# Patient Record
Sex: Female | Born: 1995 | Race: Black or African American | Hispanic: No | Marital: Single | Smoking: Never smoker
Health system: Southern US, Community
[De-identification: ages and names within clinical notes are randomized; demographics above are authoritative.]

## PROBLEM LIST (undated history)

## (undated) DIAGNOSIS — R109 Unspecified abdominal pain: Secondary | ICD-10-CM

## (undated) DIAGNOSIS — F419 Anxiety disorder, unspecified: Secondary | ICD-10-CM

## (undated) DIAGNOSIS — E559 Vitamin D deficiency, unspecified: Secondary | ICD-10-CM

## (undated) HISTORY — DX: Anxiety disorder, unspecified: F41.9

## (undated) HISTORY — PX: OTHER SURGICAL HISTORY: SHX169

## (undated) HISTORY — DX: Unspecified abdominal pain: R10.9

---
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
## 2013-06-17 ENCOUNTER — Encounter: Payer: Self-pay | Admitting: Neurology

## 2013-06-21 ENCOUNTER — Ambulatory Visit: Payer: BC Managed Care – PPO | Admitting: Neurology

## 2013-09-16 ENCOUNTER — Ambulatory Visit: Payer: BC Managed Care – PPO | Admitting: Neurology

## 2014-02-10 ENCOUNTER — Encounter: Payer: Self-pay | Admitting: Internal Medicine

## 2014-02-10 ENCOUNTER — Ambulatory Visit: Payer: Medicaid Other | Attending: Internal Medicine | Admitting: Internal Medicine

## 2014-02-10 VITALS — BP 151/106 | HR 87 | Temp 98.1°F | Resp 16 | Ht 66.0 in | Wt 135.0 lb

## 2014-02-10 DIAGNOSIS — R102 Pelvic and perineal pain: Secondary | ICD-10-CM | POA: Diagnosis present

## 2014-02-10 DIAGNOSIS — R030 Elevated blood-pressure reading, without diagnosis of hypertension: Secondary | ICD-10-CM | POA: Diagnosis not present

## 2014-02-10 DIAGNOSIS — IMO0001 Reserved for inherently not codable concepts without codable children: Secondary | ICD-10-CM

## 2014-02-10 DIAGNOSIS — Z2821 Immunization not carried out because of patient refusal: Secondary | ICD-10-CM | POA: Diagnosis not present

## 2014-02-10 DIAGNOSIS — R1084 Generalized abdominal pain: Secondary | ICD-10-CM

## 2014-02-10 LAB — POCT URINALYSIS DIPSTICK
Bilirubin, UA: NEGATIVE
Blood, UA: NEGATIVE
Glucose, UA: NEGATIVE
Ketones, UA: NEGATIVE
Leukocytes, UA: NEGATIVE
Nitrite, UA: NEGATIVE
Protein, UA: NEGATIVE
Spec Grav, UA: 1.025
Urobilinogen, UA: 1
pH, UA: 7

## 2014-02-10 LAB — POCT URINE PREGNANCY: Preg Test, Ur: NEGATIVE

## 2014-02-10 NOTE — Progress Notes (Signed)
Pt is here to establish care. Pt reports having abdomen pain for a month and a half. Pt states that she has no discharge, burning or vaginal itching/odor.

## 2014-02-10 NOTE — Progress Notes (Signed)
Patient ID: Regina RosebushBriana Duerr, female   DOB: 22-Nov-1995, 19 y.o.   MRN: [id number]  [id number]  [id number]  DOB - 22-Nov-1995  CC:  Chief Complaint  Patient presents with  . Establish Care       HPI: Regina Warren is a 19 y.o. female here today to establish medical care.  She has no past medical history.   Abdominal pain for 1 month--no vaginal discharge, itch, odor, lesions.  Lower pelvic pain, cramps. Pain is not related to periods. Intermittent. Has tried tylenol for pain. Occasional nausea, no vomiting. Denies dyspareunia.  Tested for STD's in September and everything was negative.   Been on OCP since 04/2013.  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  She does not increased stress with school schedule and work load. Patient denies family history of hypertension.  Patient has No headache, No chest pain, No abdominal pain - No Nausea, No new weakness tingling or numbness, No Cough - SOB.  No Known Allergies History reviewed. No pertinent past medical history. No current outpatient prescriptions on file prior to visit.   No current facility-administered medications on file prior to visit.   History reviewed. No pertinent family history. History   Social History  . Marital Status: Single    Spouse Name: N/A    Number of Children: N/A  . Years of Education: N/A   Occupational History  . Not on file.   Social History Main Topics  . Smoking status: Never Smoker   . Smokeless tobacco: Not on file  . Alcohol Use: No  . Drug Use: No  . Sexual Activity: Yes   Other Topics Concern  . Not on file   Social History Narrative  . No narrative on file    Review of Systems: Se HPI    Objective:   Filed Vitals:   02/10/14 1636  BP: 151/106  Pulse: 87  Temp: 98.1 F (36.7 C)  Resp: 16    Physical Exam: Constitutional: Patient appears well-developed and well-nourished. No distress. Eyes: Conjunctivae and EOM  are normal. PERRLA, no scleral icterus. Neck: Normal ROM. Neck supple. No JVD. No tracheal deviation. No thyromegaly. CVS: RRR, S1/S2 +, no murmurs, no gallops, no carotid bruit.  Pulmonary: Effort and breath sounds normal, no stridor, rhonchi, wheezes, rales.  Abdominal: Soft. BS +, no distension, tenderness, rebound or guarding.  Musculoskeletal: Normal range of motion. No edema and no tenderness.  Skin: Skin is warm and dry. No rash noted. Not diaphoretic. No erythema. No pallor. Psychiatric: Normal mood and affect. Behavior, judgment, thought content normal.  No results found for: WBC, HGB, HCT, MCV, PLT No results found for: CREATININE, BUN, NA, K, CL, CO2  No results found for: HGBA1C Lipid Panel  No results found for: CHOL, TRIG, HDL, CHOLHDL, VLDL, LDLCALC     Assessment and plan:   [name] was seen today for establish care.  Diagnoses and associated orders for this visit:  Generalized abdominal pain - POCT urinalysis dipstick--negative - POCT urine pregnancy-negative  Elevated BP Will discontinue current combination birth control pill and have her to come back in one week to see if there is any improvement in BP. Patient chooses to wait until BP recheck to decide if she wants to start on Micronor-progestin only pill. Discussed the need to use protection when having sex to prevent pregnancy and STD transmission. Patient verbalizes understanding.  Refused influenza vaccine Explained benefits of vaccine especially with  her being in college campus community to prevent spread and transmission of virus   Return in about 2 weeks (around 02/24/2014) for Nurse Visit-BP check.     Holland Commons, NP-C [facility] [phone] 02/10/2014, 5:24 PM

## 2014-02-10 NOTE — Patient Instructions (Signed)
DASH Eating Plan DASH stands for "Dietary Approaches to Stop Hypertension." The DASH eating plan is a healthy eating plan that has been shown to reduce high blood pressure (hypertension). Additional health benefits may include reducing the risk of type 2 diabetes mellitus, heart disease, and stroke. The DASH eating plan may also help with weight loss. WHAT DO I NEED TO KNOW ABOUT THE DASH EATING PLAN? For the DASH eating plan, you will follow these general guidelines:  Choose foods with a percent daily value for sodium of less than 5% (as listed on the food label).  Use salt-free seasonings or herbs instead of table salt or sea salt.  Check with your health care provider or pharmacist before using salt substitutes.  Eat lower-sodium products, often labeled as "lower sodium" or "no salt added."  Eat fresh foods.  Eat more vegetables, fruits, and low-fat dairy products.  Choose whole grains. Look for the word "whole" as the first word in the ingredient list.  Choose fish and skinless chicken or turkey more often than red meat. Limit fish, poultry, and meat to 6 oz (170 g) each day.  Limit sweets, desserts, sugars, and sugary drinks.  Choose heart-healthy fats.  Limit cheese to 1 oz (28 g) per day.  Eat more home-cooked food and less restaurant, buffet, and fast food.  Limit fried foods.  Cook foods using methods other than frying.  Limit canned vegetables. If you do use them, rinse them well to decrease the sodium.  When eating at a restaurant, ask that your food be prepared with less salt, or no salt if possible. WHAT FOODS CAN I EAT? Seek help from a dietitian for individual calorie needs. Grains Whole grain or whole wheat bread. Brown rice. Whole grain or whole wheat pasta. Quinoa, bulgur, and whole grain cereals. Low-sodium cereals. Corn or whole wheat flour tortillas. Whole grain cornbread. Whole grain crackers. Low-sodium crackers. Vegetables Fresh or frozen vegetables  (raw, steamed, roasted, or grilled). Low-sodium or reduced-sodium tomato and vegetable juices. Low-sodium or reduced-sodium tomato sauce and paste. Low-sodium or reduced-sodium canned vegetables.  Fruits All fresh, canned (in natural juice), or frozen fruits. Meat and Other Protein Products Ground beef (85% or leaner), grass-fed beef, or beef trimmed of fat. Skinless chicken or turkey. Ground chicken or turkey. Pork trimmed of fat. All fish and seafood. Eggs. Dried beans, peas, or lentils. Unsalted nuts and seeds. Unsalted canned beans. Dairy Low-fat dairy products, such as skim or 1% milk, 2% or reduced-fat cheeses, low-fat ricotta or cottage cheese, or plain low-fat yogurt. Low-sodium or reduced-sodium cheeses. Fats and Oils Tub margarines without trans fats. Light or reduced-fat mayonnaise and salad dressings (reduced sodium). Avocado. Safflower, olive, or canola oils. Natural peanut or almond butter. Other Unsalted popcorn and pretzels. The items listed above may not be a complete list of recommended foods or beverages. Contact your dietitian for more options. WHAT FOODS ARE NOT RECOMMENDED? Grains White bread. White pasta. White rice. Refined cornbread. Bagels and croissants. Crackers that contain trans fat. Vegetables Creamed or fried vegetables. Vegetables in a cheese sauce. Regular canned vegetables. Regular canned tomato sauce and paste. Regular tomato and vegetable juices. Fruits Dried fruits. Canned fruit in light or heavy syrup. Fruit juice. Meat and Other Protein Products Fatty cuts of meat. Ribs, chicken wings, bacon, sausage, bologna, salami, chitterlings, fatback, hot dogs, bratwurst, and packaged luncheon meats. Salted nuts and seeds. Canned beans with salt. Dairy Whole or 2% milk, cream, half-and-half, and cream cheese. Whole-fat or sweetened yogurt. Full-fat   cheeses or blue cheese. Nondairy creamers and whipped toppings. Processed cheese, cheese spreads, or cheese  curds. Condiments Onion and garlic salt, seasoned salt, table salt, and sea salt. Canned and packaged gravies. Worcestershire sauce. Tartar sauce. Barbecue sauce. Teriyaki sauce. Soy sauce, including reduced sodium. Steak sauce. Fish sauce. Oyster sauce. Cocktail sauce. Horseradish. Ketchup and mustard. Meat flavorings and tenderizers. Bouillon cubes. Hot sauce. Tabasco sauce. Marinades. Taco seasonings. Relishes. Fats and Oils Butter, stick margarine, lard, shortening, ghee, and bacon fat. Coconut, palm kernel, or palm oils. Regular salad dressings. Other Pickles and olives. Salted popcorn and pretzels. The items listed above may not be a complete list of foods and beverages to avoid. Contact your dietitian for more information. WHERE CAN I FIND MORE INFORMATION? National Heart, Lung, and Blood Institute: [employer] Document Released: 01/02/2011 Document Revised: 05/30/2013 Document Reviewed: 11/17/2012 [facility] Patient Information 2015 [place], [place]. This information is not intended to replace advice given to you by your health care provider. Make sure you discuss any questions you have with your health care provider.  Norethindrone tablets (contraception) What is this medicine? NORETHINDRONE (nor eth IN drone) is an oral contraceptive. The product contains a female hormone known as a progestin. It is used to prevent pregnancy. This medicine may be used for other purposes; ask your health care provider or pharmacist if you have questions. COMMON BRAND NAME(S): Camila, Deblitane 28-Day, Errin, Heather, [place], [place], [place], Nor-QD, Nora-BE, Norlyroc, Ortho Micronor, [employer] 28-Day What should I tell my health care provider before I take this medicine? They need to know if you have any of these conditions: -blood vessel disease or blood clots -breast, cervical, or vaginal cancer -diabetes -heart disease -kidney disease -liver disease -mental  depression -migraine -seizures -stroke -vaginal bleeding -an unusual or allergic reaction to norethindrone, other medicines, foods, dyes, or preservatives -pregnant or trying to get pregnant -breast-feeding How should I use this medicine? Take this medicine by mouth with a glass of water. You may take it with or without food. Follow the directions on the prescription label. Take this medicine at the same time each day and in the order directed on the package. Do not take your medicine more often than directed. Contact your pediatrician regarding the use of this medicine in children. Special care may be needed. This medicine has been used in female children who have started having menstrual periods. A patient package insert for the product will be given with each prescription and refill. Read this sheet carefully each time. The sheet may change frequently. Overdosage: If you think you have taken too much of this medicine contact a poison control center or emergency room at once. NOTE: This medicine is only for you. Do not share this medicine with others. What if I miss a dose? Try not to miss a dose. Every time you miss a dose or take a dose late your chance of pregnancy increases. When 1 pill is missed (even if only 3 hours late), take the missed pill as soon as possible and continue taking a pill each day at the regular time (use a back up method of birth control for the next 48 hours). If more than 1 dose is missed, use an additional birth control method for the rest of your pill pack until menses occurs. Contact your health care professional if more than 1 dose has been missed. What may interact with this medicine? Do not take this medicine with any of the following medications: -amprenavir or fosamprenavir -bosentan This medicine may  also interact with the following medications: -antibiotics or medicines for infections, especially rifampin, rifabutin, rifapentine, and griseofulvin, and  possibly penicillins or tetracyclines -aprepitant -barbiturate medicines, such as phenobarbital -carbamazepine -felbamate -modafinil -oxcarbazepine -phenytoin -ritonavir or other medicines for HIV infection or AIDS -St. John's wort -topiramate This list may not describe all possible interactions. Give your health care provider a list of all the medicines, herbs, non-prescription drugs, or dietary supplements you use. Also tell them if you smoke, drink alcohol, or use illegal drugs. Some items may interact with your medicine. What should I watch for while using this medicine? Visit your doctor or health care professional for regular checks on your progress. You will need a regular breast and pelvic exam and Pap smear while on this medicine. Use an additional method of birth control during the first cycle that you take these tablets. If you have any reason to think you are pregnant, stop taking this medicine right away and contact your doctor or health care professional. If you are taking this medicine for hormone related problems, it may take several cycles of use to see improvement in your condition. This medicine does not protect you against HIV infection (AIDS) or any other sexually transmitted diseases. What side effects may I notice from receiving this medicine? Side effects that you should report to your doctor or health care professional as soon as possible: -breast tenderness or discharge -pain in the abdomen, chest, groin or leg -severe headache -skin rash, itching, or hives -sudden shortness of breath -unusually weak or tired -vision or speech problems -yellowing of skin or eyes Side effects that usually do not require medical attention (report to your doctor or health care professional if they continue or are bothersome): -changes in sexual desire -change in menstrual flow -facial hair growth -fluid retention and swelling -headache -irritability -nausea -weight gain or  loss This list may not describe all possible side effects. Call your doctor for medical advice about side effects. You may report side effects to FDA at 1-800-FDA-1088. Where should I keep my medicine? Keep out of the reach of children. Store at room temperature between 15 and 30 degrees C (59 and 86 degrees F). Throw away any unused medicine after the expiration date. NOTE: This sheet is a summary. It may not cover all possible information. If you have questions about this medicine, talk to your doctor, pharmacist, or health care provider.  2015, Elsevier/Gold Standard. (2011-10-03 16:41:35)

## 2014-02-16 ENCOUNTER — Telehealth: Payer: Self-pay

## 2014-02-16 ENCOUNTER — Ambulatory Visit: Payer: Medicaid Other | Attending: Internal Medicine | Admitting: Pharmacist

## 2014-02-16 VITALS — BP 134/92 | HR 75 | Temp 98.0°F | Resp 16

## 2014-02-16 DIAGNOSIS — IMO0001 Reserved for inherently not codable concepts without codable children: Secondary | ICD-10-CM

## 2014-02-16 DIAGNOSIS — R030 Elevated blood-pressure reading, without diagnosis of hypertension: Principal | ICD-10-CM

## 2014-02-16 NOTE — Telephone Encounter (Signed)
Per Regina Warren patient put on schedule to discuss Birth control and to get blood work Blood pressure has been elevated past couple of visits

## 2014-02-16 NOTE — Progress Notes (Signed)
   Subjective:    Patient ID: Regina Warren, female    DOB: [birth year], 19 y.o.   MRN: [id number]  HPI    Review of Systems     Objective:   Physical Exam        Assessment & Plan:

## 2014-02-16 NOTE — Progress Notes (Signed)
Patient states here to have her blood pressure checked Patient has since stopped taking her birth control pills due To the fact she says that is what might have caused her blood pressure  To be elevated Patient did state she is under a lot of stress right now with classes Patient stated when she checked her blood pressure a few days ago it was 139/94 i had patient sit for about twenty minutes to destress Her blood pressure is now 134/ 92 Spoke with Holland CommonsValerie Keck NP We will bring patient back in one week before starting new birth control medication-to check her blood pressure If pressure is still elevated we will send her for US renal to r/o renal artery stenosis

## 2014-02-20 ENCOUNTER — Ambulatory Visit: Payer: Medicaid Other | Admitting: Internal Medicine

## 2014-03-01 ENCOUNTER — Ambulatory Visit: Payer: Medicaid Other | Attending: Internal Medicine | Admitting: Internal Medicine

## 2014-03-01 ENCOUNTER — Encounter: Payer: Self-pay | Admitting: Internal Medicine

## 2014-03-01 ENCOUNTER — Ambulatory Visit: Payer: Medicaid Other | Admitting: Internal Medicine

## 2014-03-01 VITALS — BP 130/70 | HR 107 | Temp 98.0°F | Resp 16 | Ht 66.0 in | Wt 136.0 lb

## 2014-03-01 DIAGNOSIS — IMO0001 Reserved for inherently not codable concepts without codable children: Secondary | ICD-10-CM

## 2014-03-01 DIAGNOSIS — Z30011 Encounter for initial prescription of contraceptive pills: Secondary | ICD-10-CM

## 2014-03-01 DIAGNOSIS — Z309 Encounter for contraceptive management, unspecified: Secondary | ICD-10-CM

## 2014-03-01 DIAGNOSIS — R030 Elevated blood-pressure reading, without diagnosis of hypertension: Secondary | ICD-10-CM

## 2014-03-01 LAB — POCT URINE PREGNANCY: PREG TEST UR: NEGATIVE

## 2014-03-01 MED ORDER — NORETHINDRONE 0.35 MG PO TABS: 1.0000 | ORAL_TABLET | Freq: Every day | ORAL | Status: DC

## 2014-03-01 NOTE — Progress Notes (Signed)
Pt is here to have lab work today.

## 2014-03-01 NOTE — Patient Instructions (Signed)
Please have school nurse check BP every other day and write it down for review. Call me with numbers

## 2014-03-02 LAB — COMPLETE METABOLIC PANEL WITH GFR
ALT: 16 U/L (ref 0–35)
AST: 17 U/L (ref 0–37)
Albumin: 4.3 g/dL (ref 3.5–5.2)
Alkaline Phosphatase: 47 U/L (ref 39–117)
BUN: 11 mg/dL (ref 6–23)
CHLORIDE: 103 meq/L (ref 96–112)
CO2: 24 mEq/L (ref 19–32)
Calcium: 9.2 mg/dL (ref 8.4–10.5)
Creat: 0.69 mg/dL (ref 0.50–1.10)
GFR, Est African American: >89 mL/min
GFR, Est Non African American: >89 mL/min
GLUCOSE: 91 mg/dL (ref 70–99)
Potassium: 4 mEq/L (ref 3.5–5.3)
Sodium: 136 mEq/L (ref 135–145)
Total Bilirubin: 0.4 mg/dL (ref 0.2–1.2)
Total Protein: 7.3 g/dL (ref 6.0–8.3)

## 2014-03-02 LAB — TSH: TSH: 2.256 u[IU]/mL (ref 0.350–4.500)

## 2014-03-02 LAB — CBC
HCT: 40.6 % (ref 36.0–46.0)
Hemoglobin: 14.1 g/dL (ref 12.0–15.0)
MCH: 32 pg (ref 26.0–34.0)
MCHC: 34.7 g/dL (ref 30.0–36.0)
MCV: 92.3 fL (ref 78.0–100.0)
MPV: 10.4 fL (ref 8.6–12.4)
PLATELETS: 291 10*3/uL (ref 150–400)
RBC: 4.4 MIL/uL (ref 3.87–5.11)
RDW: 12.4 % (ref 11.5–15.5)
WBC: 4.4 10*3/uL (ref 4.0–10.5)

## 2014-03-02 LAB — VITAMIN D 25 HYDROXY (VIT D DEFICIENCY, FRACTURES): Vit D, 25-Hydroxy: 6 ng/mL — ABNORMAL LOW (ref 30–100)

## 2014-03-02 LAB — T4, FREE: Free T4: 1.09 ng/dL (ref 0.80–1.80)

## 2014-03-03 ENCOUNTER — Telehealth: Payer: Self-pay | Admitting: *Deleted

## 2014-03-03 ENCOUNTER — Ambulatory Visit: Payer: Medicaid Other | Admitting: Internal Medicine

## 2014-03-03 MED ORDER — VITAMIN D (ERGOCALCIFEROL) 1.25 MG (50000 UNIT) PO CAPS: 50000.0000 [IU] | ORAL_CAPSULE | ORAL | Status: DC

## 2014-03-03 NOTE — Telephone Encounter (Signed)
-----   Message from Ambrose FinlandValerie A Keck, NP sent at 03/02/2014  9:01 PM EST ----- Vitamin D is low. Please send drisdol 50,000 IU to take once weekly for 12 weeks. 12 tablets no refills. Explain that this is important for bone development and may help with feelings of fatigue. All other labs are normal

## 2014-03-03 NOTE — Telephone Encounter (Signed)
-----   Message from Valerie A Keck, NP sent at 03/02/2014  9:01 PM EST ----- Vitamin D is low. Please send drisdol 50,000 IU to take once weekly for 12 weeks. 12 tablets no refills. Explain that this is important for bone development and may help with feelings of fatigue. All other labs are normal  

## 2014-03-03 NOTE — Telephone Encounter (Signed)
LVM to return call [facility]

## 2014-03-03 NOTE — Telephone Encounter (Signed)
[facility] Pt aware of resuts

## 2014-03-14 DIAGNOSIS — R030 Elevated blood-pressure reading, without diagnosis of hypertension: Secondary | ICD-10-CM | POA: Insufficient documentation

## 2014-03-14 NOTE — Progress Notes (Signed)
Patient ID: Regina RosebushBriana Finkbiner, female   DOB: 09/23/1995, 19 y.o.   MRN: [id number]  CC: birth control, elevated BP  HPI: Regina Warren is a 19 y.o. female here today for a follow up visit.  Patient presents to clinic today for a repeat blood pressure check.  She states that she has not been able to get her BP checked since our last visit but believes that it is elevated due to being nervous and stressed. Patient has no family history of hypertension.  Patient would like to monitor her blood pressure outside of clinic to see if it comes down.  She is also interested beginning on a non estrogen containing birth control pill.  She has no symptoms of headache, palpitations, chest pain, SOB, or edema.   Patient has No headache, No chest pain, No abdominal pain - No Nausea, No new weakness tingling or numbness, No Cough - SOB.  No Known Allergies History reviewed. No pertinent past medical history. Current Outpatient Prescriptions on File Prior to Visit  Medication Sig Dispense Refill  . norethindrone-ethinyl estradiol (MICROGESTIN,JUNEL,LOESTRIN) 1-20 MG-MCG tablet Take 1 tablet by mouth daily.     No current facility-administered medications on file prior to visit.   History reviewed. No pertinent family history. History   Social History  . Marital Status: Single    Spouse Name: N/A  . Number of Children: N/A  . Years of Education: N/A   Occupational History  . Not on file.   Social History Main Topics  . Smoking status: Never Smoker   . Smokeless tobacco: Not on file  . Alcohol Use: No  . Drug Use: No  . Sexual Activity: Yes   Other Topics Concern  . Not on file   Social History Narrative    Review of Systems: See HPI   Objective:   Filed Vitals:   03/01/14 1724  BP: 130/70  Pulse:   Temp:   Resp:     Physical Exam  Constitutional: She is oriented to person, place, and time.  Cardiovascular: Normal rate, regular rhythm and normal heart sounds.   Pulmonary/Chest:  Effort normal and breath sounds normal.  Abdominal: Soft. Bowel sounds are normal.  Musculoskeletal: She exhibits no edema.  Neurological: She is alert and oriented to person, place, and time.  Skin: Skin is warm and dry.     Lab Results  Component Value Date   WBC 4.4 03/01/2014   HGB 14.1 03/01/2014   HCT 40.6 03/01/2014   MCV 92.3 03/01/2014   PLT 291 03/01/2014   Lab Results  Component Value Date   CREATININE 0.69 03/01/2014   BUN 11 03/01/2014   NA 136 03/01/2014   K 4.0 03/01/2014   CL 103 03/01/2014   CO2 24 03/01/2014    No results found for: HGBA1C Lipid Panel  No results found for: CHOL, TRIG, HDL, CHOLHDL, VLDL, LDLCALC     Assessment and plan:   [name] was seen today for follow-up.  Diagnoses and all orders for this visit:  Elevated BP Orders: -     CBC -     COMPLETE METABOLIC PANEL WITH GFR -     TSH -     T4, free -     Vitamin D, 25-hydroxy Patient will let school nurse check her BP every couple of days and call back to clinic to report the values.   BCP (birth control pills) initiation Orders: -     POCT urine pregnancy -  Begin norethindrone (MICRONOR,CAMILA,ERRIN) 0.35 MG tablet; Take 1 tablet (0.35 mg total) by mouth daily. Explained to patient the importance of using condoms for STD/HIV prevention and for the first few weeks to prevent pregnancy. Explained that this medication should be taken at the same time daily to prevent pregnancy.      Return for call me with BP numbers next week.   Holland Commons, NP-C [facility] [phone] 03/14/2014, 3:00 PM

## 2014-03-15 ENCOUNTER — Telehealth: Payer: Self-pay | Admitting: *Deleted

## 2014-03-15 NOTE — Telephone Encounter (Signed)
Please call patient and find out if she was able to get her BP checked. Find out what the number was if possible. Thanks    Pt called and stated was unable to get BP check will do it today and tomorrow. Will call back with information

## 2014-05-22 ENCOUNTER — Ambulatory Visit: Payer: Medicaid Other | Admitting: Internal Medicine

## 2014-05-25 ENCOUNTER — Encounter: Payer: Self-pay | Admitting: Internal Medicine

## 2014-05-25 ENCOUNTER — Ambulatory Visit: Payer: Medicaid Other | Attending: Internal Medicine | Admitting: Internal Medicine

## 2014-05-25 VITALS — BP 130/89 | HR 79 | Temp 98.5°F | Resp 16 | Ht 66.0 in | Wt 134.0 lb

## 2014-05-25 DIAGNOSIS — Z3041 Encounter for surveillance of contraceptive pills: Secondary | ICD-10-CM | POA: Diagnosis not present

## 2014-05-25 MED ORDER — NORGESTIMATE-ETH ESTRADIOL 0.25-35 MG-MCG PO TABS: 1.0000 | ORAL_TABLET | Freq: Every day | ORAL | Status: DC

## 2014-05-25 NOTE — Progress Notes (Signed)
Pt is here today wanting change her birth control.

## 2014-05-25 NOTE — Patient Instructions (Signed)
Ethinyl Estradiol; Norgestimate tablets What is this medicine? ETHINYL ESTRADIOL; NORGESTIMATE (ETH in il es tra DYE ole; nor JES ti mate) is an oral contraceptive. The products combine two types of female hormones, an estrogen and a progestin. They are used to prevent ovulation and pregnancy. Some products are also used to treat acne in females. This medicine may be used for other purposes; ask your health care provider or pharmacist if you have questions. COMMON BRAND NAME(S): Estarylla, MONO-LINYAH, MonoNessa, Ortho Tri-Cyclen, Ortho Tri-Cyclen Lo, Ortho-Cyclen, Previfem, Sprintec, Tri-Estarylla, TRI-LINYAH, Tri-Lo-Sprintec, Tri-Previfem, Tri-Sprintec, Trinessa What should I tell my health care provider before I take this medicine? They need to know if you have or ever had any of these conditions: -abnormal vaginal bleeding -blood vessel disease or blood clots -breast, cervical, endometrial, ovarian, liver, or uterine cancer -diabetes -gallbladder disease -heart disease or recent heart attack -high blood pressure -high cholesterol -kidney disease -liver disease -migraine headaches -stroke -systemic lupus erythematosus (SLE) -tobacco smoker -an unusual or allergic reaction to estrogens, progestins, other medicines, foods, dyes, or preservatives -pregnant or trying to get pregnant -breast-feeding How should I use this medicine? Take this medicine by mouth. To reduce nausea, this medicine may be taken with food. Follow the directions on the prescription label. Take this medicine at the same time each day and in the order directed on the package. Do not take your medicine more often than directed. Contact your pediatrician regarding the use of this medicine in children. Special care may be needed. This medicine has been used in female children who have started having menstrual periods. A patient package insert for the product will be given with each prescription and refill. Read this sheet  carefully each time. The sheet may change frequently. Overdosage: If you think you have taken too much of this medicine contact a poison control center or emergency room at once. NOTE: This medicine is only for you. Do not share this medicine with others. What if I miss a dose? If you miss a dose, refer to the patient information sheet you received with your medicine for direction. If you miss more than one pill, this medicine may not be as effective and you may need to use another form of birth control. What may interact with this medicine? -acetaminophen -antibiotics or medicines for infections, especially rifampin, rifabutin, rifapentine, and griseofulvin, and possibly penicillins or tetracyclines -aprepitant -ascorbic acid (vitamin C) -atorvastatin -barbiturate medicines, such as phenobarbital -bosentan -carbamazepine -caffeine -clofibrate -cyclosporine -dantrolene -doxercalciferol -felbamate -grapefruit juice -hydrocortisone -medicines for anxiety or sleeping problems, such as diazepam or temazepam -medicines for diabetes, including pioglitazone -mineral oil -modafinil -mycophenolate -nefazodone -oxcarbazepine -phenytoin -prednisolone -ritonavir or other medicines for HIV infection or AIDS -rosuvastatin -selegiline -soy isoflavones supplements -St. John's wort -tamoxifen or raloxifene -theophylline -thyroid hormones -topiramate -warfarin This list may not describe all possible interactions. Give your health care provider a list of all the medicines, herbs, non-prescription drugs, or dietary supplements you use. Also tell them if you smoke, drink alcohol, or use illegal drugs. Some items may interact with your medicine. What should I watch for while using this medicine? Visit your doctor or health care professional for regular checks on your progress. You will need a regular breast and pelvic exam and Pap smear while on this medicine. You should also discuss the need  for regular mammograms with your health care professional, and follow his or her guidelines for these tests. This medicine can make your body retain fluid, making your fingers, hands, or ankles   swell. Your blood pressure can go up. Contact your doctor or health care professional if you feel you are retaining fluid. Use an additional method of contraception during the first cycle that you take these tablets. If you have any reason to think you are pregnant, stop taking this medicine right away and contact your doctor or health care professional. If you are taking this medicine for hormone related problems, it may take several cycles of use to see improvement in your condition. Smoking increases the risk of getting a blood clot or having a stroke while you are taking birth control pills, especially if you are more than 21 years old. You are strongly advised not to smoke. This medicine can make you more sensitive to the sun. Keep out of the sun. If you cannot avoid being in the sun, wear protective clothing and use sunscreen. Do not use sun lamps or tanning beds/booths. If you wear contact lenses and notice visual changes, or if the lenses begin to feel uncomfortable, consult your eye care specialist. In some women, tenderness, swelling, or minor bleeding of the gums may occur. Notify your dentist if this happens. Brushing and flossing your teeth regularly may help limit this. See your dentist regularly and inform your dentist of the medicines you are taking. If you are going to have elective surgery, you may need to stop taking this medicine before the surgery. Consult your health care professional for advice. This medicine does not protect you against HIV infection (AIDS) or any other sexually transmitted diseases. What side effects may I notice from receiving this medicine? Side effects that you should report to your doctor or health care professional as soon as possible: -breast tissue changes or  discharge -changes in vaginal bleeding during your period or between your periods -chest pain -coughing up blood -dizziness or fainting spells -headaches or migraines -leg, arm or groin pain -severe or sudden headaches -stomach pain (severe) -sudden shortness of breath -sudden loss of coordination, especially on one side of the body -speech problems -symptoms of vaginal infection like itching, irritation or unusual discharge -tenderness in the upper abdomen -vomiting -weakness or numbness in the arms or legs, especially on one side of the body -yellowing of the eyes or skin Side effects that usually do not require medical attention (report to your doctor or health care professional if they continue or are bothersome): -breakthrough bleeding and spotting that continues beyond the 3 initial cycles of pills -breast tenderness -mood changes, anxiety, depression, frustration, anger, or emotional outbursts -increased sensitivity to sun or ultraviolet light -nausea -skin rash, acne, or brown spots on the skin -weight gain (slight) This list may not describe all possible side effects. Call your doctor for medical advice about side effects. You may report side effects to FDA at 1-800-FDA-1088. Where should I keep my medicine? Keep out of the reach of children. Store at room temperature between 15 and 30 degrees C (59 and 86 degrees F). Throw away any unused medicine after the expiration date. NOTE: This sheet is a summary. It may not cover all possible information. If you have questions about this medicine, talk to your doctor, pharmacist, or health care provider.  2015, Elsevier/Gold Standard. (2007-12-30 13:40:47)  

## 2014-05-25 NOTE — Progress Notes (Signed)
Patient ID: Regina Warren, female   DOB: 1995/04/08, 19 y.o.   MRN: [id number]  CC: birth control changes  HPI: Regina Warren is a 19 y.o. female here today for a follow up visit.  Patient reports that she would like to have her birth control changes because she has been having breakthrough bleeding. She reports that she does not like the micronor pill and would like to have better control of her cycles. Patient states that she has been checking her blood pressure at school and it has continued to be less than 120.   Patient has No headache, No chest pain, No abdominal pain - No Nausea, No new weakness tingling or numbness, No Cough - SOB.  No Known Allergies History reviewed. No pertinent past medical history. Current Outpatient Prescriptions on File Prior to Visit  Medication Sig Dispense Refill  . norethindrone (MICRONOR,CAMILA,ERRIN) 0.35 MG tablet Take 1 tablet (0.35 mg total) by mouth daily. 1 Package 11  . Vitamin D, Ergocalciferol, (DRISDOL) 50000 UNITS CAPS capsule Take 1 capsule (50,000 Units total) by mouth every 7 (seven) days. (Patient not taking: Reported on 05/25/2014) 12 capsule 0   No current facility-administered medications on file prior to visit.   History reviewed. No pertinent family history. History   Social History  . Marital Status: Single    Spouse Name: N/A  . Number of Children: N/A  . Years of Education: N/A   Occupational History  . Not on file.   Social History Main Topics  . Smoking status: Never Smoker   . Smokeless tobacco: Not on file  . Alcohol Use: No  . Drug Use: No  . Sexual Activity: Yes   Other Topics Concern  . Not on file   Social History Narrative    Review of Systems: See HPI   Objective:   Filed Vitals:   05/25/14 1429  BP: 130/89  Pulse: 79  Temp: 98.5 F (36.9 C)  Resp: 16    Physical Exam  Cardiovascular: Normal rate, regular rhythm and normal heart sounds.   Pulmonary/Chest: Effort normal and breath sounds  normal.  ] .  Lab Results  Component Value Date   WBC 4.4 03/01/2014   HGB 14.1 03/01/2014   HCT 40.6 03/01/2014   MCV 92.3 03/01/2014   PLT 291 03/01/2014   Lab Results  Component Value Date   CREATININE 0.69 03/01/2014   BUN 11 03/01/2014   NA 136 03/01/2014   K 4.0 03/01/2014   CL 103 03/01/2014   CO2 24 03/01/2014    No results found for: HGBA1C Lipid Panel  No results found for: CHOL, TRIG, HDL, CHOLHDL, VLDL, LDLCALC     Assessment and plan:   Regina Warren was seen today for follow-up.  Diagnoses and all orders for this visit:  Visit for birth control pills maintenance Orders: -     norgestimate-ethinyl estradiol (SPRINTEC 28) 0.25-35 MG-MCG tablet; Take 1 tablet by mouth daily. Discontinue Micronor and switch to Sprintec.     Return if symptoms worsen or fail to improve.   Holland CommonsKECK, Angles Trevizo, NP-C [facility] [phone] 05/25/2014, 3:05 PM

## 2014-06-06 ENCOUNTER — Telehealth: Payer: Self-pay | Admitting: Internal Medicine

## 2014-06-06 ENCOUNTER — Other Ambulatory Visit: Payer: Self-pay | Admitting: Internal Medicine

## 2014-06-06 ENCOUNTER — Emergency Department (HOSPITAL_COMMUNITY)
Admission: EM | Admit: 2014-06-06 | Discharge: 2014-06-06 | Disposition: A | Discharge status: Home / Self Care | Payer: Medicaid Other | Attending: Emergency Medicine | Admitting: Emergency Medicine

## 2014-06-06 ENCOUNTER — Encounter (HOSPITAL_COMMUNITY): Payer: Self-pay | Admitting: Emergency Medicine

## 2014-06-06 DIAGNOSIS — Z793 Long term (current) use of hormonal contraceptives: Secondary | ICD-10-CM | POA: Insufficient documentation

## 2014-06-06 DIAGNOSIS — R109 Unspecified abdominal pain: Secondary | ICD-10-CM

## 2014-06-06 DIAGNOSIS — Z3200 Encounter for pregnancy test, result unknown: Secondary | ICD-10-CM | POA: Diagnosis present

## 2014-06-06 DIAGNOSIS — Z3202 Encounter for pregnancy test, result negative: Secondary | ICD-10-CM | POA: Diagnosis not present

## 2014-06-06 LAB — URINALYSIS, ROUTINE W REFLEX MICROSCOPIC
Bilirubin Urine: NEGATIVE
Glucose, UA: NEGATIVE mg/dL
Ketones, ur: NEGATIVE mg/dL
Leukocytes, UA: NEGATIVE
Nitrite: NEGATIVE
Protein, ur: NEGATIVE mg/dL
Specific Gravity, Urine: 1.023 (ref 1.005–1.030)
Urobilinogen, UA: 1 mg/dL (ref 0.0–1.0)
pH: 6 (ref 5.0–8.0)

## 2014-06-06 LAB — URINE MICROSCOPIC-ADD ON

## 2014-06-06 LAB — POC URINE PREG, ED: Preg Test, Ur: NEGATIVE

## 2014-06-06 NOTE — Telephone Encounter (Signed)
Pt has question regarding pregarding test/results and needs follow up information, please f/u with pt. Pt prefers to speak to female clinician.

## 2014-06-06 NOTE — Discharge Instructions (Signed)

## 2014-06-06 NOTE — ED Notes (Signed)
Patient coming from home with possible pregnancy.  States last period was on 05/23/14.  States "I took a pregnancy test with the 1st being positive, 2nd was invalid and the other two were invalid".  Patient has bloating and lower abdominal pain.

## 2014-06-06 NOTE — ED Provider Notes (Signed)
CSN: [id number]     Arrival date & time 06/06/14  [id number] History   First MD Initiated Contact with Patient 06/06/14 [phone]     Chief Complaint  Patient presents with  . Possible Pregnancy     (Consider location/radiation/quality/duration/timing/severity/associated sxs/prior Treatment) Patient is a 19 y.o. female presenting with pregnancy problem. The history is provided by the patient. No language interpreter was used.  Possible Pregnancy The current episode started yesterday. The problem occurs constantly. The problem has been unchanged. Pertinent negatives include no abdominal pain. Nothing aggravates the symptoms. She has tried nothing for the symptoms. The treatment provided moderate relief.  Pt had a positive home pregnancy and a negative home pregnancy.     History reviewed. No pertinent past medical history. Past Surgical History  Procedure Laterality Date  . Lung    . Lung puncture     History reviewed. No pertinent family history. History  Substance Use Topics  . Smoking status: Never Smoker   . Smokeless tobacco: Not on file  . Alcohol Use: No   OB History    No data available     Review of Systems  Gastrointestinal: Negative for abdominal pain.  All other systems reviewed and are negative.     Allergies  Review of patient's allergies indicates no known allergies.  Home Medications   Prior to Admission medications   Medication Sig Start Date End Date Taking? Authorizing Provider  norethindrone (MICRONOR,CAMILA,ERRIN) 0.35 MG tablet Take 1 tablet (0.35 mg total) by mouth daily. 03/01/14   Ambrose FinlandValerie A Keck, NP  norgestimate-ethinyl estradiol (SPRINTEC 28) 0.25-35 MG-MCG tablet Take 1 tablet by mouth daily. 05/25/14   Ambrose FinlandValerie A Keck, NP  Vitamin D, Ergocalciferol, (DRISDOL) 50000 UNITS CAPS capsule Take 1 capsule (50,000 Units total) by mouth every 7 (seven) days. Patient not taking: Reported on 05/25/2014 03/03/14   Ambrose FinlandValerie A Keck, NP   BP 141/81 mmHg  Pulse 86   Temp(Src) 98.6 F (37 C) (Oral)  Resp 17  Ht 5\' 6"  (1.676 m)  Wt 134 lb (60.782 kg)  BMI 21.64 kg/m2  SpO2 99%  LMP 05/18/2014 Physical Exam  Constitutional: She is oriented to person, place, and time. She appears well-developed and well-nourished.  HENT:  Head: Normocephalic.  Eyes: EOM are normal.  Neck: Normal range of motion.  Pulmonary/Chest: Effort normal.  Abdominal: Soft. She exhibits no distension.  Musculoskeletal: Normal range of motion.  Neurological: She is alert and oriented to person, place, and time.  Psychiatric: She has a normal mood and affect.  Nursing note and vitals reviewed.   ED Course  Procedures (including critical care time) Labs Review Labs Reviewed  URINALYSIS, ROUTINE W REFLEX MICROSCOPIC  POC URINE PREG, ED    Imaging Review No results found.   EKG Interpretation None      MDM   Final diagnoses:  Abdominal cramping    Repeat pregnancy test in 72 hours.   See Dr. Luna Glasgowkeck for recheck    Elson AreasLeslie K Sofia, PA-C 06/06/14 [phone]  Bethann BerkshireJoseph Zammit, MD 06/06/14 [phone]

## 2014-06-07 NOTE — Telephone Encounter (Signed)
Patient called in concerned that she could be pregnant.  She did go to the emergency room yesterday where they performed a pregnancy test that came back negative.  She is still concerned that she is pregnant.  Patient reports that she has been taking her birth control pills because her cycles are irregular.  She was unable to tell me when her cycle was due.  I advised her to retake the pregnancy test at home and if it came back positive to make an appointment with PCP and to stop taking her birth control pills.

## 2014-06-09 ENCOUNTER — Telehealth: Payer: Self-pay | Admitting: Internal Medicine

## 2014-06-09 ENCOUNTER — Ambulatory Visit: Payer: Medicaid Other | Attending: Internal Medicine | Admitting: Internal Medicine

## 2014-06-09 VITALS — BP 135/80 | HR 73 | Temp 98.8°F | Ht 66.0 in | Wt 134.0 lb

## 2014-06-09 DIAGNOSIS — Z3202 Encounter for pregnancy test, result negative: Secondary | ICD-10-CM | POA: Insufficient documentation

## 2014-06-09 DIAGNOSIS — R109 Unspecified abdominal pain: Secondary | ICD-10-CM | POA: Diagnosis not present

## 2014-06-09 DIAGNOSIS — Z3200 Encounter for pregnancy test, result unknown: Secondary | ICD-10-CM

## 2014-06-09 LAB — POCT URINE PREGNANCY: Preg Test, Ur: NEGATIVE

## 2014-06-09 NOTE — Progress Notes (Signed)
Patient started having severe abdominal pain on and off 4 days ago.  She is sexually active and is on birth control pills and states there have been times she has forgotten to take her pill but will take it the following day.  She went to the ED where a pregnancy test was negative.  She has taken several tests at home and some are positive and some are negative.  She is still having the severe abdominal pain.

## 2014-06-09 NOTE — Progress Notes (Signed)
Patient ID: Regina RosebushBriana Warren, female   DOB: July 07, 1995, 19 y.o.   MRN: [id number]  CC: pregnancy test  HPI: Regina Warren is a 19 y.o. female here today for a follow up visit. Patient was seen in the ER on 06/06/14 for abdominal cramping. She reported at that time that she had a positive and a negative home pregnancy test. While in the ER she had a negative urine pregnancy test and was told to follow up with her PCP in 3 days for a repeat test. She is concerned about why she is having severe abdominal cramping. LMP was 4/21-4/26.  She denies vaginal bleeding, discharge, odor, new sexual partners, constipation, diarrhea, nausea, vomiting, heartburn.  Patient has No headache, No chest pain, No abdominal pain - No Nausea, No new weakness tingling or numbness, No Cough - SOB.  No Known Allergies No past medical history on file. Current Outpatient Prescriptions on File Prior to Visit  Medication Sig Dispense Refill  . norgestimate-ethinyl estradiol (SPRINTEC 28) 0.25-35 MG-MCG tablet Take 1 tablet by mouth daily. 1 Package 11  . Vitamin D, Ergocalciferol, (DRISDOL) 50000 UNITS CAPS capsule Take 1 capsule (50,000 Units total) by mouth every 7 (seven) days. (Patient not taking: Reported on 05/25/2014) 12 capsule 0   No current facility-administered medications on file prior to visit.   No family history on file. History   Social History  . Marital Status: Single    Spouse Name: N/A  . Number of Children: N/A  . Years of Education: N/A   Occupational History  . Not on file.   Social History Main Topics  . Smoking status: Never Smoker   . Smokeless tobacco: Not on file  . Alcohol Use: No  . Drug Use: No  . Sexual Activity: Yes   Other Topics Concern  . Not on file   Social History Narrative    Review of Systems: See HPI   Objective:   Filed Vitals:   06/09/14 1538  BP: 135/80  Pulse: 73  Temp: 98.8 F (37.1 C)    Physical Exam  Cardiovascular: Normal rate, regular rhythm  and normal heart sounds.   Pulmonary/Chest: Effort normal.  Abdominal: Soft. Bowel sounds are normal. She exhibits no distension and no mass. There is no tenderness. There is no rebound and no guarding.  Skin: Skin is warm and dry.     Lab Results  Component Value Date   WBC 4.4 03/01/2014   HGB 14.1 03/01/2014   HCT 40.6 03/01/2014   MCV 92.3 03/01/2014   PLT 291 03/01/2014   Lab Results  Component Value Date   CREATININE 0.69 03/01/2014   BUN 11 03/01/2014   NA 136 03/01/2014   K 4.0 03/01/2014   CL 103 03/01/2014   CO2 24 03/01/2014    No results found for: HGBA1C Lipid Panel  No results found for: CHOL, TRIG, HDL, CHOLHDL, VLDL, LDLCALC     Assessment and plan:   [name] was seen today for abdominal pain.  Diagnoses and all orders for this visit:  Possible pregnancy Orders: -     POCT urine pregnancy---negative  -     hCG, serum, qualitative Patient reports that the test both initially read negative but when she looked at them again several hours later there where two line indicating pregnancy. I have explained that she needs to follow directions on box wait 3-5 mins as indicated to read test and then discard.  Abdominal cramping Orders: -     hCG, serum,  qualitative I have explained to patient that her body may be adjusting to her new OCP. Abdominal pain began 2 weeks ago around same time she switched from progestin only pills to combination contraceptive pills.  I have given patient strict return precautions.  Return if symptoms worsen or fail to improve.       Holland CommonsKECK, Jerel Sardina, NP-C [facility] [phone] 06/09/2014, 4:12 PM

## 2014-06-09 NOTE — Telephone Encounter (Signed)
Patient called requesting to speak to PCP stating that it is an emergency, please f/u with pt.

## 2014-06-10 LAB — HCG, SERUM, QUALITATIVE: Preg, Serum: NEGATIVE

## 2014-06-12 ENCOUNTER — Encounter: Payer: Self-pay | Admitting: Internal Medicine

## 2014-06-14 NOTE — Telephone Encounter (Signed)
-----   Message from Ambrose FinlandValerie A Keck, NP sent at 06/10/2014  9:33 AM EDT ----- Negative serum preg test. Use condoms as well as staying consistent with birth control

## 2014-06-14 NOTE — Telephone Encounter (Signed)
Spoke to patient gave her test results.  Patient verbalized understanding.

## 2015-07-17 ENCOUNTER — Encounter (HOSPITAL_COMMUNITY): Payer: Self-pay | Admitting: Emergency Medicine

## 2015-07-17 DIAGNOSIS — R45851 Suicidal ideations: Secondary | ICD-10-CM | POA: Diagnosis not present

## 2015-07-17 DIAGNOSIS — F322 Major depressive disorder, single episode, severe without psychotic features: Secondary | ICD-10-CM | POA: Diagnosis not present

## 2015-07-17 DIAGNOSIS — T402X2A Poisoning by other opioids, intentional self-harm, initial encounter: Secondary | ICD-10-CM | POA: Diagnosis not present

## 2015-07-18 ENCOUNTER — Encounter (HOSPITAL_COMMUNITY): Payer: Self-pay | Admitting: *Deleted

## 2015-07-18 DIAGNOSIS — T402X2A Poisoning by other opioids, intentional self-harm, initial encounter: Secondary | ICD-10-CM | POA: Diagnosis not present

## 2015-07-18 DIAGNOSIS — F1210 Cannabis abuse, uncomplicated: Secondary | ICD-10-CM | POA: Diagnosis present

## 2015-07-18 DIAGNOSIS — R45851 Suicidal ideations: Secondary | ICD-10-CM | POA: Diagnosis not present

## 2015-07-18 DIAGNOSIS — F4323 Adjustment disorder with mixed anxiety and depressed mood: Secondary | ICD-10-CM | POA: Diagnosis not present

## 2015-07-18 DIAGNOSIS — F322 Major depressive disorder, single episode, severe without psychotic features: Secondary | ICD-10-CM | POA: Diagnosis not present

## 2015-07-19 ENCOUNTER — Encounter (HOSPITAL_COMMUNITY): Payer: Self-pay | Admitting: Psychiatry

## 2016-05-07 ENCOUNTER — Encounter: Payer: Self-pay | Admitting: Family Medicine

## 2016-05-07 ENCOUNTER — Ambulatory Visit (INDEPENDENT_AMBULATORY_CARE_PROVIDER_SITE_OTHER): Payer: Medicaid Other | Admitting: Family Medicine

## 2016-05-07 VITALS — BP 121/88 | HR 72 | Temp 98.6°F | Resp 16 | Ht 65.0 in | Wt 146.0 lb

## 2016-05-07 DIAGNOSIS — R51 Headache: Secondary | ICD-10-CM

## 2016-05-07 DIAGNOSIS — R300 Dysuria: Secondary | ICD-10-CM | POA: Diagnosis not present

## 2016-05-07 DIAGNOSIS — Z111 Encounter for screening for respiratory tuberculosis: Secondary | ICD-10-CM

## 2016-05-07 DIAGNOSIS — R519 Headache, unspecified: Secondary | ICD-10-CM

## 2016-05-07 DIAGNOSIS — Z113 Encounter for screening for infections with a predominantly sexual mode of transmission: Secondary | ICD-10-CM | POA: Diagnosis not present

## 2016-05-07 DIAGNOSIS — Z0000 Encounter for general adult medical examination without abnormal findings: Secondary | ICD-10-CM

## 2016-05-07 DIAGNOSIS — Z131 Encounter for screening for diabetes mellitus: Secondary | ICD-10-CM

## 2016-05-07 LAB — POCT URINALYSIS DIP (DEVICE)
BILIRUBIN URINE: NEGATIVE
Glucose, UA: NEGATIVE mg/dL
Ketones, ur: NEGATIVE mg/dL
NITRITE: NEGATIVE
PH: 7 (ref 5.0–8.0)
Protein, ur: NEGATIVE mg/dL
Specific Gravity, Urine: 1.015 (ref 1.005–1.030)
Urobilinogen, UA: 0.2 mg/dL (ref 0.0–1.0)

## 2016-05-07 LAB — CBC WITH DIFFERENTIAL/PLATELET
BASOS PCT: 1 %
Basophils Absolute: 29 cells/uL (ref 0–200)
EOS ABS: 29 {cells}/uL (ref 15–500)
EOS PCT: 1 %
HCT: 45 % (ref 35.0–45.0)
HEMOGLOBIN: 15.1 g/dL (ref 11.7–15.5)
LYMPHS ABS: 1305 {cells}/uL (ref 850–3900)
Lymphocytes Relative: 45 %
MCH: 31.9 pg (ref 27.0–33.0)
MCHC: 33.6 g/dL (ref 32.0–36.0)
MCV: 94.9 fL (ref 80.0–100.0)
MONOS PCT: 8 %
MPV: 10.1 fL (ref 7.5–12.5)
Monocytes Absolute: 232 cells/uL (ref 200–950)
NEUTROS ABS: 1305 {cells}/uL — AB (ref 1500–7800)
Neutrophils Relative %: 45 %
PLATELETS: 315 10*3/uL (ref 140–400)
RBC: 4.74 MIL/uL (ref 3.80–5.10)
RDW: 12.7 % (ref 11.0–15.0)
WBC: 2.9 10*3/uL — ABNORMAL LOW (ref 3.8–10.8)

## 2016-05-07 LAB — POCT URINE PREGNANCY: PREG TEST UR: NEGATIVE

## 2016-05-07 MED ORDER — NAPROXEN 500 MG PO TABS: 500.0000 mg | ORAL_TABLET | Freq: Two times a day (BID) | ORAL | 0 refills | Status: DC

## 2016-05-07 MED ORDER — NORETHINDRONE 0.35 MG PO TABS: 1.0000 | ORAL_TABLET | Freq: Every day | ORAL | 12 refills | Status: DC

## 2016-05-07 NOTE — Patient Instructions (Addendum)
Call Dr. Mikael Spray) [phone]  office for eye appointment. I'm not certain if he takes medicaid or if medicaid will cover opthalmic visit.

## 2016-05-07 NOTE — Progress Notes (Addendum)
Regina Warren   MRN: [id number] DOB: 03-27-1995   Chief Complaint  Patient presents with  . Establish Care  . Annual Exam    PAPERWORK FOR JOB/ PAP FOR ANOTHER DAY  . Back Pain  . Headache    Subjective:  Regina Warren is a 21 y.o. female is a 21 y.o. female who presents for annual physical exam. She is originally from [place], [place] although she recently relocated from [place]. She is the mother of a infant. Last sexual activity 1.5 months ago. She takes oral contraceptives as her birth control method. Last annual healthcare visit for prior to giving birth. Reports a regular routine vaginal birth. However, postpartum she was hospitalized for postpartum hypertension for about 1 week after giving birth. No other issues since with her blood pressure to her knowledge. She did not go for her 6 week postpartum follow-up visit as her insurance was cancelled. Her last dental exam 2016. She has an appointment scheduled for eye exam.   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  No Known Allergies  Social History   Social History  . Marital status: Single    Spouse name: N/A  . Number of children: N/A  . Years of education: N/A   Social History Main Topics  . Smoking status: Never Smoker  . Smokeless tobacco: Never Used  . Alcohol use Yes  . Drug use: No  . Sexual activity: Not Asked   Other Topics Concern  . None   Social History Narrative  . None     Review of Systems  Constitutional: Negative.   HENT: Negative.   Eyes: Negative.   Respiratory: Negative.   Cardiovascular: Negative.   Gastrointestinal: Negative.   Genitourinary: Negative.        Reports regular menstrual monthly period. Reports regular manageable cramps   Musculoskeletal: Positive for back pain.  Skin: Negative.   Neurological: Positive for headaches.       Frequent headaches at least 5  headaches per week. She feels that if she is stressed headaches are more intense and last longer require more medication to get rid of.  Psychiatric/Behavioral: Negative for agitation, confusion, decreased concentration and sleep disturbance. The patient is not nervous/anxious.     Objective:  BP 121/88 (BP Location: Right Arm, Patient Position: Sitting, Cuff Size: Small)   Pulse 72   Temp 98.6 F (37 C) (Oral)   Resp 16   Ht  (1.651 m)   Wt 146 lb (66.2 kg)   LMP 04/26/2016   SpO2 100%   BMI 24.30 kg/m   Physical Exam  Constitutional: She is oriented to person, place, and time and well-developed, well-nourished, and in no distress.  HENT:  Head: Normocephalic and atraumatic.  Right Ear: External ear normal.  Left Ear: External ear normal.  Nose: Nose normal.  Mouth/Throat: Oropharynx is clear and moist.  Eyes: Conjunctivae and EOM are normal. Pupils are equal, round, and reactive to light.  Neck: Normal range of motion. Neck supple. No thyromegaly present.  Cardiovascular: Normal rate, regular rhythm, normal heart sounds and intact distal pulses.   Pulmonary/Chest: Effort normal and breath sounds normal.  Abdominal: Soft. Bowel sounds are normal.  Musculoskeletal: Normal range of motion.  Neurological: She is oriented to person, place, and time. She displays normal reflexes. Gait normal. Coordination normal. GCS score is 15.  Skin: Skin is warm and dry.  Psychiatric: Mood, memory, affect and judgment normal.      Physical Exam  Constitutional: She is oriented to person, place, and time and well-developed, well-nourished, and in no distress.  HENT:  Head: Normocephalic and atraumatic.  Right Ear: External ear normal.  Left Ear: External ear normal.  Nose: Nose normal.  Mouth/Throat: Oropharynx is clear and moist.  Eyes: Conjunctivae and EOM are normal. Pupils are equal, round, and reactive to light.  Neck: Normal range of motion. Neck supple. No thyromegaly  present.  Cardiovascular: Normal rate, regular rhythm, normal heart sounds and intact distal pulses.   Pulmonary/Chest: Effort normal and breath sounds normal.  Abdominal: Soft. Bowel sounds are normal.  Musculoskeletal: Normal range of motion.  Neurological: She is oriented to person, place, and time. She displays normal reflexes. Gait normal. Coordination normal. GCS score is 15.  Skin: Skin is warm and dry.  Psychiatric: Mood, memory, affect and judgment normal.    Assessment and Plan :  Discussed healthy lifestyle, diet, exercise, preventative care, vaccinations, and addressed patient's concerns.  Plan for follow up in 2 days for gynecological visit. Otherwise, plan for specific conditions below.  1. Annual physical exam  -Age-specific anticipatory guidance provided  2. Screening examination for STD (sexually transmitted disease) - CBC with Differential - Thyroid Panel With TSH - HIV antibody - GC/Chlamydia Probe Amp - RPR - POCT urine pregnancy  3. Screening-pulmonary TB - Quantiferon tb gold assay (blood)  4. Dysuria - Urine culture  5. Screening for diabetes mellitus - COMPLETE METABOLIC PANEL WITH GFR  6. Non intractable Headache -Naproxen 500, take 1 every 12 hours as needed with food.  You will be notified of lab results upon receipt.  You may pick-up your work physical form on Friday.  RTC: 2 days for Gynecological  exam   Godfrey Pick. Tiburcio Pea, MSN, [facility] [address] [place], [place] [address] [phone]

## 2016-05-08 ENCOUNTER — Other Ambulatory Visit: Payer: Self-pay | Admitting: Family Medicine

## 2016-05-08 DIAGNOSIS — Z01419 Encounter for gynecological examination (general) (routine) without abnormal findings: Secondary | ICD-10-CM

## 2016-05-08 LAB — COMPLETE METABOLIC PANEL WITH GFR
ALT: 16 U/L (ref 6–29)
AST: 18 U/L (ref 10–30)
Albumin: 4.5 g/dL (ref 3.6–5.1)
Alkaline Phosphatase: 74 U/L (ref 33–115)
BILIRUBIN TOTAL: 0.6 mg/dL (ref 0.2–1.2)
BUN: 9 mg/dL (ref 7–25)
CO2: 24 mmol/L (ref 20–31)
Calcium: 9.8 mg/dL (ref 8.6–10.2)
Chloride: 103 mmol/L (ref 98–110)
Creat: 0.75 mg/dL (ref 0.50–1.10)
Glucose, Bld: 92 mg/dL (ref 65–99)
POTASSIUM: 4.3 mmol/L (ref 3.5–5.3)
Sodium: 137 mmol/L (ref 135–146)
TOTAL PROTEIN: 7.7 g/dL (ref 6.1–8.1)

## 2016-05-08 LAB — GC/CHLAMYDIA PROBE AMP
CT Probe RNA: NOT DETECTED
GC Probe RNA: NOT DETECTED

## 2016-05-08 LAB — HIV ANTIBODY (ROUTINE TESTING W REFLEX): HIV 1&2 Ab, 4th Generation: NONREACTIVE

## 2016-05-08 LAB — URINE CULTURE: ORGANISM ID, BACTERIA: NO GROWTH

## 2016-05-08 LAB — RPR

## 2016-05-08 LAB — THYROID PANEL WITH TSH
FREE THYROXINE INDEX: 1.9 (ref 1.4–3.8)
T3 Uptake: 27 % (ref 22–35)
T4 TOTAL: 7 ug/dL (ref 4.5–12.0)
TSH: 2.39 m[IU]/L

## 2016-05-09 ENCOUNTER — Ambulatory Visit (INDEPENDENT_AMBULATORY_CARE_PROVIDER_SITE_OTHER): Payer: Medicaid Other | Admitting: Family Medicine

## 2016-05-09 ENCOUNTER — Other Ambulatory Visit (HOSPITAL_COMMUNITY)
Admission: RE | Admit: 2016-05-09 | Discharge: 2016-05-09 | Disposition: A | Discharge status: Home / Self Care | Payer: Medicaid Other | Source: Ambulatory Visit | Attending: Family Medicine | Admitting: Family Medicine

## 2016-05-09 ENCOUNTER — Encounter: Payer: Self-pay | Admitting: Family Medicine

## 2016-05-09 DIAGNOSIS — Z0000 Encounter for general adult medical examination without abnormal findings: Secondary | ICD-10-CM

## 2016-05-09 DIAGNOSIS — Z01419 Encounter for gynecological examination (general) (routine) without abnormal findings: Secondary | ICD-10-CM | POA: Insufficient documentation

## 2016-05-09 LAB — QUANTIFERON TB GOLD ASSAY (BLOOD)
Interferon Gamma Release Assay: NEGATIVE
MITOGEN-NIL SO: 5.08 [IU]/mL
QUANTIFERON NIL VALUE: 0.01 [IU]/mL
QUANTIFERON TB AG MINUS NIL: 0.04 [IU]/mL

## 2016-05-09 NOTE — Progress Notes (Signed)
   Patient ID: Regina Warren, female    DOB: [birth year], 21 y.o.   MRN: [id number]  PCP: Joaquin Courts, FNP  Chief Complaint  Patient presents with  . Gynecologic Exam    lab results    Subjective:  HPI  Regina Warren is a 21 y.o. female presents for pap only. Patient reports that she never had a 6 week follow-up after the birth of her baby and is overdue for a PAP smear.  She was seen two days ago in the office for annual physical and screened for STD at that time which were negative. Denies vaginal discharge, pain, dysuria, or irregular menses. She is currently prescribed oral contraceptives and her last menstrual period date 04/26/2016.  Social History   Social History  . Marital status: Single    Spouse name: N/A  . Number of children: N/A  . Years of education: N/A   Occupational History  . Not on file.   Social History Main Topics  . Smoking status: Never Smoker  . Smokeless tobacco: Never Used  . Alcohol use Yes  . Drug use: No  . Sexual activity: Not on file   Other Topics Concern  . Not on file   Social History Narrative  . No narrative on file    Review of Systems See HPI  Prior to Admission medications   Medication Sig Start Date End Date Taking? Authorizing Provider  naproxen (NAPROSYN) 500 MG tablet Take 1 tablet (500 mg total) by mouth 2 (two) times daily with a meal. 05/07/16  Yes Doyle Askew, FNP  norethindrone (MICRONOR,CAMILA,ERRIN) 0.35 MG tablet Take 1 tablet (0.35 mg total) by mouth daily. 05/07/16  Yes Doyle Askew, FNP    Past Medical, Surgical Family and Social History reviewed and updated.    Objective:   Today's Vitals   05/09/16 0820  BP: 136/74  Pulse: 100  Resp: 16  Temp: 98.6 F (37 C)  TempSrc: Oral  SpO2: 100%  Weight: 143 lb (64.9 kg)  Height:  (1.651 m)    Wt Readings from Last 3 Encounters:  05/09/16 143 lb (64.9 kg)  05/07/16 146 lb (66.2 kg)    Physical Exam  Abdominal:  Hernia confirmed negative in the right inguinal area and confirmed negative in the left inguinal area.  Genitourinary: Vagina normal and uterus normal. Pelvic exam was performed with patient supine. There is no rash, tenderness, lesion or injury on the right labia. There is no rash, tenderness, lesion or injury on the left labia. Cervix exhibits no motion tenderness, no discharge and no friability. Right adnexum displays no mass, no tenderness and no fullness. Left adnexum displays no mass, no tenderness and no fullness. No erythema, tenderness or bleeding in the vagina. No foreign body in the vagina. No signs of injury around the vagina. No vaginal discharge found.  Lymphadenopathy:       Right: No inguinal adenopathy present.       Left: No inguinal adenopathy present.      Assessment & Plan:  1. Encounter for annual routine gynecological examination - Cytology - PAP  You will be notified of any abnormal findings. If results are normal, return for repeat PAP in 3 years.     Godfrey Pick. Tiburcio Pea, MSN, [facility] [address] [place], [place] [address] [phone]

## 2016-05-12 LAB — CYTOLOGY - PAP
ADEQUACY: ABSENT
BACTERIAL VAGINITIS: POSITIVE — AB
Candida vaginitis: NEGATIVE
Diagnosis: NEGATIVE
NEISSERIA GONORRHEA: NEGATIVE
TRICH (WINDOWPATH): NEGATIVE

## 2016-06-18 ENCOUNTER — Telehealth: Payer: Self-pay

## 2016-06-18 MED ORDER — METRONIDAZOLE 500 MG PO TABS: 500.0000 mg | ORAL_TABLET | Freq: Two times a day (BID) | ORAL | 0 refills | Status: DC

## 2016-06-18 NOTE — Telephone Encounter (Signed)
Patient notified

## 2016-06-18 NOTE — Telephone Encounter (Signed)
For BV-Metronidazole 500 mg twice daily was sent to her pharmacy on filed. Do not drink any alcohol while taking this medication as she likely will experience a severe reaction.  Joaquin CourtsKimberly Arsenia Goracke, FNP

## 2016-06-18 NOTE — Telephone Encounter (Signed)
Advise patient her PAP was normal with the exception of Bacterial Vaginosis was present.  Bacterial vaginosis which is not a STI it is simply an overgrowth of normal flora bacteria within the vagina. If she is or becomes symptomatic with vaginal odor or vaginal discharge, let me know, I will e-prescribed metronidazole for treatment.  Joaquin CourtsKimberly Leighana Neyman, FNP

## 2016-06-18 NOTE — Telephone Encounter (Signed)
Patient has been having some discharge and would like the to have the medication sent to pharmacy on file.

## 2016-06-18 NOTE — Addendum Note (Signed)
Addended by: Bing NeighborsHARRIS, Kamilya Wakeman S on: 06/18/2016 03:57 PM   Modules accepted: Orders

## 2016-06-18 NOTE — Telephone Encounter (Signed)
Patient calling regarding pap results and I didn't see any note on her labs.

## 2016-07-18 ENCOUNTER — Emergency Department (HOSPITAL_COMMUNITY)
Admission: EM | Admit: 2016-07-18 | Discharge: 2016-07-18 | Disposition: A | Discharge status: Home / Self Care | Payer: Medicaid Other | Attending: Emergency Medicine | Admitting: Emergency Medicine

## 2016-07-18 ENCOUNTER — Encounter (HOSPITAL_COMMUNITY): Payer: Self-pay | Admitting: Emergency Medicine

## 2016-07-18 DIAGNOSIS — N939 Abnormal uterine and vaginal bleeding, unspecified: Secondary | ICD-10-CM | POA: Insufficient documentation

## 2016-07-18 DIAGNOSIS — Z79899 Other long term (current) drug therapy: Secondary | ICD-10-CM | POA: Insufficient documentation

## 2016-07-18 DIAGNOSIS — Z791 Long term (current) use of non-steroidal anti-inflammatories (NSAID): Secondary | ICD-10-CM | POA: Insufficient documentation

## 2016-07-18 LAB — URINALYSIS, ROUTINE W REFLEX MICROSCOPIC
Bilirubin Urine: NEGATIVE
GLUCOSE, UA: NEGATIVE mg/dL
KETONES UR: NEGATIVE mg/dL
Leukocytes, UA: NEGATIVE
Nitrite: NEGATIVE
PROTEIN: NEGATIVE mg/dL
Specific Gravity, Urine: 1.018 (ref 1.005–1.030)
pH: 5 (ref 5.0–8.0)

## 2016-07-18 LAB — I-STAT BETA HCG BLOOD, ED (MC, WL, AP ONLY): I-stat hCG, quantitative: <5 m[IU]/mL (ref <5)

## 2016-07-18 LAB — WET PREP, GENITAL
CLUE CELLS WET PREP: NONE SEEN
Sperm: NONE SEEN
TRICH WET PREP: NONE SEEN
Yeast Wet Prep HPF POC: NONE SEEN

## 2016-07-18 NOTE — ED Triage Notes (Signed)
Pt reports vag bleeding with severe cramping beginning Tuesday, denies clots. Pt reports pain improved, 2/10, at this time, describes "crampy". Pt reports normal menstral cycle June 5-9, reports being on same birth control pill for years, denies changes to routine.

## 2016-07-18 NOTE — ED Provider Notes (Signed)
[facility]-EMERGENCY DEPT Provider Note   CSN: [id number] Arrival date & time: 07/18/16  1200     History   Chief Complaint Chief Complaint  Patient presents with  . Vaginal Bleeding    HPI Regina Warren is a 21 y.o. female.  HPI Patient presents with vaginal bleeding. Began 4 days ago. Last menses was 3 weeks ago. Was normal. States she's always very normal. States she has been able to use a pad but had a little increased bleeding today. Slight headache. No abdominal pain. Normally has regular menses. Does not think she is pregnant. She is on birth control pills.   History reviewed. No pertinent past medical history.  There are no active problems to display for this patient.   History reviewed. No pertinent surgical history.  OB History    No data available       Home Medications    Prior to Admission medications   Medication Sig Start Date End Date Taking? Authorizing Provider  metroNIDAZOLE (FLAGYL) 500 MG tablet Take 1 tablet (500 mg total) by mouth 2 (two) times daily with a meal. DO NOT CONSUME ALCOHOL WHILE TAKING THIS MEDICATION. 06/18/16   Bing NeighborsHarris, Kimberly S, FNP  naproxen (NAPROSYN) 500 MG tablet Take 1 tablet (500 mg total) by mouth 2 (two) times daily with a meal. 05/07/16   Bing NeighborsHarris, Kimberly S, FNP  norethindrone (MICRONOR,CAMILA,ERRIN) 0.35 MG tablet Take 1 tablet (0.35 mg total) by mouth daily. 05/07/16   Bing NeighborsHarris, Kimberly S, FNP    Family History No family history on file.  Social History Social History  Substance Use Topics  . Smoking status: Never Smoker  . Smokeless tobacco: Never Used  . Alcohol use Yes     Comment: occassisonal     Allergies   Patient has no known allergies.   Review of Systems Review of Systems  Constitutional: Negative for appetite change and fever.  HENT: Negative for congestion.   Respiratory: Negative for shortness of breath.   Cardiovascular: Negative for chest pain.  Gastrointestinal: Negative for abdominal pain.    Genitourinary: Positive for vaginal bleeding. Negative for vaginal pain.  Musculoskeletal: Negative for back pain.  Neurological: Positive for headaches.  Psychiatric/Behavioral: Negative for confusion.     Physical Exam Updated Vital Signs BP 136/86   Pulse 61   Temp 98.8 F (37.1 C) (Oral)   Resp 18   Ht 5\' 5"  (1.651 m)   Wt 63.5 kg (140 lb)   LMP 07/01/2016   SpO2 100%   BMI 23.30 kg/m   Physical Exam  Constitutional: She appears well-developed.  HENT:  Head: Atraumatic.  Eyes: EOM are normal.  Neck: Neck supple.  Cardiovascular: Normal rate.   Pulmonary/Chest: Effort normal.  Abdominal: Soft. There is no tenderness.  Neurological: She is alert.  Skin: Skin is warm. Capillary refill takes less than 2 seconds.     ED Treatments / Results  Labs (all labs ordered are listed, but only abnormal results are displayed) Labs Reviewed  WET PREP, GENITAL - Abnormal; Notable for the following:       Result Value   WBC, Wet Prep HPF POC FEW (*)    All other components within normal limits  URINALYSIS, ROUTINE W REFLEX MICROSCOPIC - Abnormal; Notable for the following:    APPearance HAZY (*)    Hgb urine dipstick LARGE (*)    Bacteria, UA RARE (*)    Squamous Epithelial / LPF 0-5 (*)    All other components within normal limits  I-STAT BETA HCG BLOOD, ED ([facility], [facility], [facility] ONLY)  GC/CHLAMYDIA PROBE AMP ([place]) NOT AT [facility]    EKG  EKG Interpretation None       Radiology No results found.  Procedures Procedures (including critical care time)  Medications Ordered in ED Medications - No data to display   Initial Impression / Assessment and Plan / ED Course  I have reviewed the triage vital signs and the nursing notes.  Pertinent labs & imaging results that were available during my care of the patient were reviewed by me and considered in my medical decision making (see chart for details).     Patient presents with vaginal bleeding. His had it for last 4  days. Not due for menses for another week and half. States it is more like bloody discharge. No real abdominal pain. Negative pregnancy test. Initial lab work ordered but due to delay in collection that was canceled. Benign exam. Will discharge home to follow-up with her gynecologist.  Final Clinical Impressions(s) / ED Diagnoses   Final diagnoses:  Vaginal bleeding    New Prescriptions New Prescriptions   No medications on file     Benjiman Core, MD 07/18/16 1504

## 2016-07-18 NOTE — ED Provider Notes (Signed)
Pelvic exam Date/Time: 07/18/2016 2:37 PM Performed by: Arthor CaptainHARRIS, Ladislav Caselli Authorized by: Arthor CaptainHARRIS, Jeziah Kretschmer  Patient identity confirmed: verbally with patient Patient tolerance: Patient tolerated the procedure well with no immediate complications Comments: Pelvic exam: normal external genitalia, vulva, vagina, cervix, uterus and adnexa. Uterine bleeding. NO CMT or Adnexal tenderness        Arthor CaptainHarris, Sheikh Leverich, PA-C 07/18/16 1438    Benjiman CorePickering, Nathan, MD 07/18/16 [phone]

## 2016-07-21 LAB — GC/CHLAMYDIA PROBE AMP (~~LOC~~) NOT AT ARMC
CHLAMYDIA, DNA PROBE: NEGATIVE
NEISSERIA GONORRHEA: NEGATIVE

## 2016-10-03 ENCOUNTER — Other Ambulatory Visit: Payer: Self-pay | Admitting: Nurse Practitioner

## 2016-10-03 DIAGNOSIS — N644 Mastodynia: Secondary | ICD-10-CM

## 2016-10-21 ENCOUNTER — Telehealth: Payer: Self-pay | Admitting: Family Medicine

## 2016-10-21 ENCOUNTER — Ambulatory Visit
Admission: RE | Admit: 2016-10-21 | Discharge: 2016-10-21 | Disposition: A | Discharge status: Home / Self Care | Payer: BLUE CROSS/BLUE SHIELD | Source: Ambulatory Visit | Attending: Nurse Practitioner | Admitting: Nurse Practitioner

## 2016-10-21 DIAGNOSIS — N644 Mastodynia: Secondary | ICD-10-CM

## 2016-10-21 NOTE — Telephone Encounter (Signed)
Patient dropped off paperwork requesting completion for a work physical. According to documentation form was completed and patient presented in the office on 05/09/2016 for a separate matter and picked up paperwork on that date. If she is requiring an updated physical form, she will need to schedule an administrative visit for work-form completion.    Godfrey Pick. Tiburcio Pea, MSN, FNP-C [facility]  [address] [place], [place] [address] [phone]

## 2016-10-22 NOTE — Telephone Encounter (Signed)
Left a vm for patient to callback regarding appointment for paperwork

## 2016-10-25 ENCOUNTER — Other Ambulatory Visit: Payer: Self-pay | Admitting: Family Medicine

## 2016-10-25 DIAGNOSIS — Z113 Encounter for screening for infections with a predominantly sexual mode of transmission: Secondary | ICD-10-CM

## 2016-10-28 ENCOUNTER — Encounter: Payer: Self-pay | Admitting: Family Medicine

## 2016-10-28 ENCOUNTER — Ambulatory Visit: Payer: BLUE CROSS/BLUE SHIELD | Admitting: Family Medicine

## 2016-11-04 ENCOUNTER — Encounter: Payer: Self-pay | Admitting: Family Medicine

## 2016-11-04 ENCOUNTER — Other Ambulatory Visit (HOSPITAL_COMMUNITY)
Admission: RE | Admit: 2016-11-04 | Discharge: 2016-11-04 | Disposition: A | Discharge status: Home / Self Care | Payer: BLUE CROSS/BLUE SHIELD | Source: Ambulatory Visit | Attending: Family Medicine | Admitting: Family Medicine

## 2016-11-04 ENCOUNTER — Ambulatory Visit (INDEPENDENT_AMBULATORY_CARE_PROVIDER_SITE_OTHER): Payer: BLUE CROSS/BLUE SHIELD | Admitting: Family Medicine

## 2016-11-04 VITALS — BP 132/84 | HR 75 | Temp 98.6°F | Resp 14 | Ht 65.0 in | Wt 156.0 lb

## 2016-11-04 DIAGNOSIS — R350 Frequency of micturition: Secondary | ICD-10-CM

## 2016-11-04 DIAGNOSIS — Z113 Encounter for screening for infections with a predominantly sexual mode of transmission: Secondary | ICD-10-CM | POA: Insufficient documentation

## 2016-11-04 DIAGNOSIS — R1032 Left lower quadrant pain: Secondary | ICD-10-CM

## 2016-11-04 DIAGNOSIS — Z3041 Encounter for surveillance of contraceptive pills: Secondary | ICD-10-CM

## 2016-11-04 DIAGNOSIS — R1031 Right lower quadrant pain: Secondary | ICD-10-CM

## 2016-11-04 LAB — POCT URINALYSIS DIP (DEVICE)
BILIRUBIN URINE: NEGATIVE
Glucose, UA: NEGATIVE mg/dL
Hgb urine dipstick: NEGATIVE
Ketones, ur: NEGATIVE mg/dL
Leukocytes, UA: NEGATIVE
NITRITE: NEGATIVE
PH: 6.5 (ref 5.0–8.0)
PROTEIN: NEGATIVE mg/dL
Specific Gravity, Urine: 1.02 (ref 1.005–1.030)
UROBILINOGEN UA: 0.2 mg/dL (ref 0.0–1.0)

## 2016-11-04 MED ORDER — NORETHINDRONE 0.35 MG PO TABS: ORAL_TABLET | ORAL | 6 refills | Status: DC

## 2016-11-04 NOTE — Progress Notes (Signed)
Patient ID: Regina Warren, female    DOB: Aug 15, 1995, 21 y.o.   MRN: [id number]  PCP: Bing Neighbors, FNP  Chief Complaint  Patient presents with  . Gynecologic Exam    Subjective:  HPI Regina Warren is a 21 y.o. female presents for evaluation of pelvic cramping and STI testing. She reports bilateraL lower abdominal cramping occurring routinely after her period. Denies painful dysuria and reports regular bowel movements. Denies menstrual periods. She hasn't taken any medication to relieve the symptoms of abdominal cramping. She also requests STI testing today. Reports recent history of unprotected sex. Denies vaginal discharge or burning. Patient's last menstrual period was 10/27/2016. Social History   Social History  . Marital status: Single    Spouse name: N/A  . Number of children: N/A  . Years of education: N/A   Occupational History  . Not on file.   Social History Main Topics  . Smoking status: Never Smoker  . Smokeless tobacco: Never Used  . Alcohol use Yes     Comment: occassisonal  . Drug use: No  . Sexual activity: Not on file   Other Topics Concern  . Not on file   Social History Narrative  . No narrative on file    Family History  Problem Relation Age of Onset  . Family history unknown: Yes   Review of Systems See HPI No Known Allergies Prior to Admission medications   Medication Sig Start Date End Date Taking? Authorizing Provider  norethindrone (MICRONOR,CAMILA,ERRIN) 0.35 MG tablet TAKE 1 TABLET(0.35 MG) BY MOUTH DAILY 10/27/16  Yes Bing Neighbors, FNP  naproxen (NAPROSYN) 500 MG tablet Take 1 tablet (500 mg total) by mouth 2 (two) times daily with a meal. Patient not taking: Reported on 11/04/2016 05/07/16   Bing Neighbors, FNP    Past Medical, Surgical Family and Social History reviewed and updated.    Objective:   Today's Vitals   11/04/16 1044  BP: 132/84  Pulse: 75  Resp: 14  Temp: 98.6 F (37 C)  TempSrc: Oral  SpO2:  100%  Weight: 156 lb (70.8 kg)  Height:  (1.651 m)    Wt Readings from Last 3 Encounters:  11/04/16 156 lb (70.8 kg)  10/28/16 151 lb (68.5 kg)  07/18/16 140 lb (63.5 kg)   Physical Exam  Constitutional: She is oriented to person, place, and time. She appears well-developed and well-nourished.  HENT:  Head: Normocephalic and atraumatic.  Eyes: Pupils are equal, round, and reactive to light. Conjunctivae and EOM are normal.  Neck: Normal range of motion. Neck supple.  Cardiovascular: Normal rate, regular rhythm, normal heart sounds and intact distal pulses.   Pulmonary/Chest: Effort normal and breath sounds normal.  Abdominal: Soft. Bowel sounds are normal. She exhibits no distension and no mass. There is no tenderness. There is no rebound and no guarding.  Genitourinary:  Genitourinary Comments: Vaginal self-specimen obtained by patient.  Musculoskeletal: Normal range of motion.  Neurological: She is alert and oriented to person, place, and time.  Skin: Skin is warm and dry.  Psychiatric: She has a normal mood and affect. Her behavior is normal. Judgment and thought content normal.   Assessment & Plan:  1. Screening examination for STD (sexually transmitted disease), vaginal specimen pending. Patient declined HIV, RPR, HSV. 2. Urinary frequency, UA negative. Urine Culture pending. 3. Bilateral lower abdominal cramping, trial NSAIDS. If pain persistent, will consider vaginal ultrasound if symptoms continue.   4. Encounter for surveillance of contraceptive pills-urine  pregnancy, negative result.  Orders Placed This Encounter  Procedures  . Urine Culture  . POCT urinalysis dipstick  . POCT urine pregnancy  . POCT urinalysis dip (device)    Meds ordered this encounter  Medications  . norethindrone (MICRONOR,CAMILA,ERRIN) 0.35 MG tablet    Sig: TAKE 1 TABLET(0.35 MG) BY MOUTH DAILY    Dispense:  28 tablet    Refill:  6    Order Specific Question:   Supervising Provider     Answer:   Quentin Angst [[id number]]   RTC: 6 months follow-up for wellness visit.    Godfrey Pick. Tiburcio Pea, MSN, FNP-C [facility]  [address] [place], [place] [address] [phone]

## 2016-11-04 NOTE — Patient Instructions (Addendum)
-For cramping , you can attempt relief with ibuprofen or tylenol. Ensure that drinking plenty of water, at least 6-8 glasses per day. If these symptoms worsen or persist, return for follow-up.   -You will be notified if your urine culture is abnormal.   -Your next PAP will not be due for 3 years as your last past was normal.    Health Maintenance, Female Adopting a healthy lifestyle and getting preventive care can go a long way to promote health and wellness. Talk with your health care provider about what schedule of regular examinations is right for you. This is a good chance for you to check in with your provider about disease prevention and staying healthy. In between checkups, there are plenty of things you can do on your own. Experts have done a lot of research about which lifestyle changes and preventive measures are most likely to keep you healthy. Ask your health care provider for more information. Weight and diet Eat a healthy diet  Be sure to include plenty of vegetables, fruits, low-fat dairy products, and lean protein.  Do not eat a lot of foods high in solid fats, added sugars, or salt.  Get regular exercise. This is one of the most important things you can do for your health. ? Most adults should exercise for at least 150 minutes each week. The exercise should increase your heart rate and make you sweat (moderate-intensity exercise). ? Most adults should also do strengthening exercises at least twice a week. This is in addition to the moderate-intensity exercise.  Maintain a healthy weight  Body mass index (BMI) is a measurement that can be used to identify possible weight problems. It estimates body fat based on height and weight. Your health care provider can help determine your BMI and help you achieve or maintain a healthy weight.  For females 48 years of age and older: ? A BMI below 18.5 is considered underweight. ? A BMI of 18.5 to 24.9 is normal. ? A BMI of 25 to  29.9 is considered overweight. ? A BMI of 30 and above is considered obese.  Watch levels of cholesterol and blood lipids  You should start having your blood tested for lipids and cholesterol at 23 years of age, then have this test every 5 years.  You may need to have your cholesterol levels checked more often if: ? Your lipid or cholesterol levels are high. ? You are older than 23 years of age. ? You are at high risk for heart disease.  Cancer screening Lung Cancer  Lung cancer screening is recommended for adults 81-74 years old who are at high risk for lung cancer because of a history of smoking.  A yearly low-dose CT scan of the lungs is recommended for people who: ? Currently smoke. ? Have quit within the past 15 years. ? Have at least a 30-pack-year history of smoking. A pack year is smoking an average of one pack of cigarettes a day for 1 year.  Yearly screening should continue until it has been 15 years since you quit.  Yearly screening should stop if you develop a health problem that would prevent you from having lung cancer treatment.  Breast Cancer  Practice breast self-awareness. This means understanding how your breasts normally appear and feel.  It also means doing regular breast self-exams. Let your health care provider know about any changes, no matter how small.  If you are in your 20s or 30s, you should have a clinical  breast exam (CBE) by a health care provider every 1-3 years as part of a regular health exam.  If you are 82 or older, have a CBE every year. Also consider having a breast X-ray (mammogram) every year.  If you have a family history of breast cancer, talk to your health care provider about genetic screening.  If you are at high risk for breast cancer, talk to your health care provider about having an MRI and a mammogram every year.  Breast cancer gene (BRCA) assessment is recommended for women who have family members with BRCA-related cancers.  BRCA-related cancers include: ? Breast. ? Ovarian. ? Tubal. ? Peritoneal cancers.  Results of the assessment will determine the need for genetic counseling and BRCA1 and BRCA2 testing.  Cervical Cancer Your health care provider may recommend that you be screened regularly for cancer of the pelvic organs (ovaries, uterus, and vagina). This screening involves a pelvic examination, including checking for microscopic changes to the surface of your cervix (Pap test). You may be encouraged to have this screening done every 3 years, beginning at age 47.  For women ages 43-65, health care providers may recommend pelvic exams and Pap testing every 3 years, or they may recommend the Pap and pelvic exam, combined with testing for human papilloma virus (HPV), every 5 years. Some types of HPV increase your risk of cervical cancer. Testing for HPV may also be done on women of any age with unclear Pap test results.  Other health care providers may not recommend any screening for nonpregnant women who are considered low risk for pelvic cancer and who do not have symptoms. Ask your health care provider if a screening pelvic exam is right for you.  If you have had past treatment for cervical cancer or a condition that could lead to cancer, you need Pap tests and screening for cancer for at least 20 years after your treatment. If Pap tests have been discontinued, your risk factors (such as having a new sexual partner) need to be reassessed to determine if screening should resume. Some women have medical problems that increase the chance of getting cervical cancer. In these cases, your health care provider may recommend more frequent screening and Pap tests.  Colorectal Cancer  This type of cancer can be detected and often prevented.  Routine colorectal cancer screening usually begins at 22 years of age and continues through 23 years of age.  Your health care provider may recommend screening at an earlier age if  you have risk factors for colon cancer.  Your health care provider may also recommend using home test kits to check for hidden blood in the stool.  A small camera at the end of a tube can be used to examine your colon directly (sigmoidoscopy or colonoscopy). This is done to check for the earliest forms of colorectal cancer.  Routine screening usually begins at age 62.  Direct examination of the colon should be repeated every 5-10 years through 23 years of age. However, you may need to be screened more often if early forms of precancerous polyps or small growths are found.  Skin Cancer  Check your skin from head to toe regularly.  Tell your health care provider about any new moles or changes in moles, especially if there is a change in a mole's shape or color.  Also tell your health care provider if you have a mole that is larger than the size of a pencil eraser.  Always use sunscreen. Apply  sunscreen liberally and repeatedly throughout the day.  Protect yourself by wearing long sleeves, pants, a wide-brimmed hat, and sunglasses whenever you are outside.  Heart disease, diabetes, and high blood pressure  High blood pressure causes heart disease and increases the risk of stroke. High blood pressure is more likely to develop in: ? People who have blood pressure in the high end of the normal range (130-139/85-89 mm Hg). ? People who are overweight or obese. ? People who are African American.  If you are 43-63 years of age, have your blood pressure checked every 3-5 years. If you are 19 years of age or older, have your blood pressure checked every year. You should have your blood pressure measured twice-once when you are at a hospital or clinic, and once when you are not at a hospital or clinic. Record the average of the two measurements. To check your blood pressure when you are not at a hospital or clinic, you can use: ? An automated blood pressure machine at a pharmacy. ? A home blood  pressure monitor.  If you are between 76 years and 62 years old, ask your health care provider if you should take aspirin to prevent strokes.  Have regular diabetes screenings. This involves taking a blood sample to check your fasting blood sugar level. ? If you are at a normal weight and have a low risk for diabetes, have this test once every three years after 23 years of age. ? If you are overweight and have a high risk for diabetes, consider being tested at a younger age or more often. Preventing infection Hepatitis B  If you have a higher risk for hepatitis B, you should be screened for this virus. You are considered at high risk for hepatitis B if: ? You were born in a country where hepatitis B is common. Ask your health care provider which countries are considered high risk. ? Your parents were born in a high-risk country, and you have not been immunized against hepatitis B (hepatitis B vaccine). ? You have HIV or AIDS. ? You use needles to inject street drugs. ? You live with someone who has hepatitis B. ? You have had sex with someone who has hepatitis B. ? You get hemodialysis treatment. ? You take certain medicines for conditions, including cancer, organ transplantation, and autoimmune conditions.  Hepatitis C  Blood testing is recommended for: ? Everyone born from [birth year] through 1967. ? Anyone with known risk factors for hepatitis C.  Sexually transmitted infections (STIs)  You should be screened for sexually transmitted infections (STIs) including gonorrhea and chlamydia if: ? You are sexually active and are younger than 23 years of age. ? You are older than 23 years of age and your health care provider tells you that you are at risk for this type of infection. ? Your sexual activity has changed since you were last screened and you are at an increased risk for chlamydia or gonorrhea. Ask your health care provider if you are at risk.  If you do not have HIV, but are at risk,  it may be recommended that you take a prescription medicine daily to prevent HIV infection. This is called pre-exposure prophylaxis (PrEP). You are considered at risk if: ? You are sexually active and do not regularly use condoms or know the HIV status of your partner(s). ? You take drugs by injection. ? You are sexually active with a partner who has HIV.  Talk with your health care provider  about whether you are at high risk of being infected with HIV. If you choose to begin PrEP, you should first be tested for HIV. You should then be tested every 3 months for as long as you are taking PrEP. Pregnancy  If you are premenopausal and you may become pregnant, ask your health care provider about preconception counseling.  If you may become pregnant, take 400 to 800 micrograms (mcg) of folic acid every day.  If you want to prevent pregnancy, talk to your health care provider about birth control (contraception). Osteoporosis and menopause  Osteoporosis is a disease in which the bones lose minerals and strength with aging. This can result in serious bone fractures. Your risk for osteoporosis can be identified using a bone density scan.  If you are 3 years of age or older, or if you are at risk for osteoporosis and fractures, ask your health care provider if you should be screened.  Ask your health care provider whether you should take a calcium or vitamin D supplement to lower your risk for osteoporosis.  Menopause may have certain physical symptoms and risks.  Hormone replacement therapy may reduce some of these symptoms and risks. Talk to your health care provider about whether hormone replacement therapy is right for you. Follow these instructions at home:  Schedule regular health, dental, and eye exams.  Stay current with your immunizations.  Do not use any tobacco products including cigarettes, chewing tobacco, or electronic cigarettes.  If you are pregnant, do not drink  alcohol.  If you are breastfeeding, limit how much and how often you drink alcohol.  Limit alcohol intake to no more than 1 drink per day for nonpregnant women. One drink equals 12 ounces of beer, 5 ounces of wine, or 1 ounces of hard liquor.  Do not use street drugs.  Do not share needles.  Ask your health care provider for help if you need support or information about quitting drugs.  Tell your health care provider if you often feel depressed.  Tell your health care provider if you have ever been abused or do not feel safe at home. This information is not intended to replace advice given to you by your health care provider. Make sure you discuss any questions you have with your health care provider. Document Released: 07/29/2010 Document Revised: 06/21/2015 Document Reviewed: 10/17/2014 Elsevier Interactive Patient Education  [employer].

## 2016-11-05 LAB — CERVICOVAGINAL ANCILLARY ONLY
Bacterial vaginitis: NEGATIVE
Candida vaginitis: NEGATIVE
Chlamydia: NEGATIVE
NEISSERIA GONORRHEA: NEGATIVE
TRICH (WINDOWPATH): NEGATIVE

## 2016-11-05 LAB — URINE CULTURE
MICRO NUMBER: 81123333
RESULT: NO GROWTH
SPECIMEN QUALITY: ADEQUATE

## 2016-11-09 LAB — POCT URINE PREGNANCY: Preg Test, Ur: NEGATIVE

## 2016-11-11 ENCOUNTER — Encounter (HOSPITAL_COMMUNITY): Payer: Self-pay | Admitting: Psychiatry

## 2016-11-14 ENCOUNTER — Other Ambulatory Visit (HOSPITAL_COMMUNITY)
Admission: RE | Admit: 2016-11-14 | Discharge: 2016-11-14 | Disposition: A | Discharge status: Home / Self Care | Payer: BLUE CROSS/BLUE SHIELD | Source: Ambulatory Visit | Attending: Family Medicine | Admitting: Family Medicine

## 2016-11-14 ENCOUNTER — Ambulatory Visit (INDEPENDENT_AMBULATORY_CARE_PROVIDER_SITE_OTHER): Payer: Self-pay | Admitting: Family Medicine

## 2016-11-14 ENCOUNTER — Encounter: Payer: Self-pay | Admitting: Family Medicine

## 2016-11-14 VITALS — BP 133/90 | HR 64 | Temp 98.6°F | Resp 14 | Ht 65.0 in | Wt 153.4 lb

## 2016-11-14 DIAGNOSIS — B373 Candidiasis of vulva and vagina: Secondary | ICD-10-CM | POA: Diagnosis not present

## 2016-11-14 DIAGNOSIS — B9689 Other specified bacterial agents as the cause of diseases classified elsewhere: Secondary | ICD-10-CM | POA: Diagnosis not present

## 2016-11-14 DIAGNOSIS — Z3200 Encounter for pregnancy test, result unknown: Secondary | ICD-10-CM

## 2016-11-14 DIAGNOSIS — N3000 Acute cystitis without hematuria: Secondary | ICD-10-CM

## 2016-11-14 DIAGNOSIS — N898 Other specified noninflammatory disorders of vagina: Secondary | ICD-10-CM

## 2016-11-14 DIAGNOSIS — N760 Acute vaginitis: Secondary | ICD-10-CM | POA: Insufficient documentation

## 2016-11-14 DIAGNOSIS — R1084 Generalized abdominal pain: Secondary | ICD-10-CM

## 2016-11-14 LAB — POCT URINALYSIS DIP (DEVICE)
Bilirubin Urine: NEGATIVE
GLUCOSE, UA: NEGATIVE mg/dL
Ketones, ur: NEGATIVE mg/dL
NITRITE: NEGATIVE
Protein, ur: NEGATIVE mg/dL
Specific Gravity, Urine: 1.025 (ref 1.005–1.030)
UROBILINOGEN UA: 1 mg/dL (ref 0.0–1.0)
pH: 6.5 (ref 5.0–8.0)

## 2016-11-14 LAB — POCT URINE PREGNANCY: Preg Test, Ur: NEGATIVE

## 2016-11-14 MED ORDER — FLUCONAZOLE 150 MG PO TABS: 150.0000 mg | ORAL_TABLET | Freq: Once | ORAL | 0 refills | Status: AC

## 2016-11-14 MED ORDER — NITROFURANTOIN MONOHYD MACRO 100 MG PO CAPS: 100.0000 mg | ORAL_CAPSULE | Freq: Two times a day (BID) | ORAL | 0 refills | Status: DC

## 2016-11-14 MED ORDER — METRONIDAZOLE 500 MG PO TABS: 500.0000 mg | ORAL_TABLET | Freq: Two times a day (BID) | ORAL | 0 refills | Status: DC

## 2016-11-14 NOTE — Progress Notes (Signed)
Patient ID: Regina AlaBriana Arielle Pittmon, female    DOB: [birth year], 21 y.o.   MRN: [id number]  PCP: Bing NeighborsHarris, Richad Ramsay S, FNP  Chief Complaint  Patient presents with  . Abdominal Pain    Subjective:  HPI Regina Warren is a 21 y.o. female presents for evaluation of low pelvic pain, vaginal irritation and discharge. She complains of vaginal burning and itching. Last sexual contact 2 days prior however, reports use of barrier protection. She describes discharge as thick, white, and skin of her vagina burns with urination. She has associated lower pelvic pain, although denies fever, chills, or flank pain. Pelvic pain is intermittent.  She has not attempted relief with any medication.  Social History   Social History  . Marital status: Single    Spouse name: N/A  . Number of children: N/A  . Years of education: N/A   Occupational History  . Not on file.   Social History Main Topics  . Smoking status: Never Smoker  . Smokeless tobacco: Never Used  . Alcohol use Yes     Comment: occassisonal  . Drug use: No  . Sexual activity: Yes   Other Topics Concern  . Not on file   Social History Narrative   ** Merged History Encounter **        Family History  Problem Relation Age of Onset  . Mental illness Neg Hx    Review of Systems See HPI  Patient Active Problem List   Diagnosis Date Noted  . Adjustment disorder with mixed anxiety and depressed mood 07/19/2015  . Drug overdose   . Elevated BP 03/14/2014    No Known Allergies  Prior to Admission medications   Medication Sig Start Date End Date Taking? Authorizing Provider  naproxen (NAPROSYN) 500 MG tablet Take 1 tablet (500 mg total) by mouth 2 (two) times daily with a meal. Patient not taking: Reported on 11/04/2016 05/07/16   Bing NeighborsHarris, Nakaya Mishkin S, FNP  norethindrone (MICRONOR,CAMILA,ERRIN) 0.35 MG tablet TAKE 1 TABLET(0.35 MG) BY MOUTH DAILY 11/04/16   Bing NeighborsHarris, Erma Raiche S, FNP    Past Medical, Surgical Family and  Social History reviewed and updated.    Objective:  There were no vitals filed for this visit.  Wt Readings from Last 3 Encounters:  11/04/16 156 lb (70.8 kg)  10/28/16 151 lb (68.5 kg)  07/18/16 140 lb (63.5 kg)   Physical Exam  Constitutional: She appears well-developed and well-nourished.  Cardiovascular: Normal rate.   Pulmonary/Chest: Effort normal.  Abdominal: There is no tenderness. There is no CVA tenderness.  Genitourinary: Vaginal discharge found.  Genitourinary Comments: Labia majora swollen, erythematous, and mildly excoriated.   Skin: Skin is warm and dry.  Psychiatric: She has a normal mood and affect. Her behavior is normal. Judgment and thought content normal.   Assessment & Plan:  1. Encounter for pregnancy test, result unknown- POCT urine pregnancy, negative  2. Generalized abdominal pain- UA significant moderate leucocytes. Will treat empirically for UTI. Start nitrofurantoin 100 mg twice daily x 10 days.Urine Culture pending.   3. Vaginal discharge- based on exam, patient likely has bacterial vaginosis and candidal infection. Will treat with dual therapy, metronidazole and diflucan. While cervicovaginal ancillary sample is pending. 4. Acute Cystitis-UA significant moderate leucocytes. Will treat empirically for UTI. Start nitrofurantoin 100 mg twice daily x 10 days.Urine Culture pending.    RTC: If symptoms do not improve or worsen. Keep 6 month follow-up scheduled.   Godfrey PickKimberly S. Tiburcio PeaHarris, MSN, FNP-C [facility]  [address] [place], [place] [address] [phone]

## 2016-11-14 NOTE — Patient Instructions (Addendum)
-  Diflucan 150 mg once only for yeast infection.  -For BV metronidazole 500 mg twice daily x 7 days. -avoid alcohol   -For UTI start nitrofurantoin 100 mg twice daily x 10 day.

## 2016-11-16 LAB — URINE CULTURE
MICRO NUMBER:: 81171414
RESULT: NO GROWTH
SPECIMEN QUALITY:: ADEQUATE

## 2016-11-17 LAB — CERVICOVAGINAL ANCILLARY ONLY
Bacterial vaginitis: POSITIVE — AB
Candida vaginitis: POSITIVE — AB
Chlamydia: NEGATIVE
Neisseria Gonorrhea: NEGATIVE
TRICH (WINDOWPATH): NEGATIVE

## 2016-11-21 ENCOUNTER — Other Ambulatory Visit: Payer: Self-pay | Admitting: Family

## 2016-11-21 DIAGNOSIS — R519 Headache, unspecified: Secondary | ICD-10-CM

## 2016-11-21 DIAGNOSIS — H538 Other visual disturbances: Secondary | ICD-10-CM

## 2016-11-21 DIAGNOSIS — R51 Headache: Principal | ICD-10-CM

## 2016-12-02 ENCOUNTER — Other Ambulatory Visit: Payer: Self-pay

## 2016-12-07 NOTE — Progress Notes (Signed)
Duplicate encounter

## 2016-12-09 ENCOUNTER — Other Ambulatory Visit: Payer: Self-pay

## 2016-12-11 ENCOUNTER — Other Ambulatory Visit: Payer: Self-pay

## 2016-12-16 ENCOUNTER — Other Ambulatory Visit: Payer: Self-pay

## 2017-01-08 ENCOUNTER — Other Ambulatory Visit: Payer: Self-pay

## 2017-01-14 ENCOUNTER — Ambulatory Visit
Admission: RE | Admit: 2017-01-14 | Discharge: 2017-01-14 | Disposition: A | Discharge status: Home / Self Care | Payer: BLUE CROSS/BLUE SHIELD | Source: Ambulatory Visit | Attending: Family | Admitting: Family

## 2017-01-14 ENCOUNTER — Ambulatory Visit (INDEPENDENT_AMBULATORY_CARE_PROVIDER_SITE_OTHER): Payer: BLUE CROSS/BLUE SHIELD | Admitting: Family Medicine

## 2017-01-14 DIAGNOSIS — R51 Headache: Principal | ICD-10-CM

## 2017-01-14 DIAGNOSIS — Z23 Encounter for immunization: Secondary | ICD-10-CM | POA: Diagnosis not present

## 2017-01-14 DIAGNOSIS — R519 Headache, unspecified: Secondary | ICD-10-CM

## 2017-01-14 DIAGNOSIS — H538 Other visual disturbances: Secondary | ICD-10-CM

## 2017-02-24 ENCOUNTER — Encounter: Payer: Self-pay | Admitting: Family Medicine

## 2017-02-24 ENCOUNTER — Ambulatory Visit (INDEPENDENT_AMBULATORY_CARE_PROVIDER_SITE_OTHER): Payer: Medicaid Other | Admitting: Family Medicine

## 2017-02-24 VITALS — BP 125/84 | HR 84 | Temp 98.0°F | Resp 16 | Ht 65.0 in | Wt 154.0 lb

## 2017-02-24 DIAGNOSIS — H547 Unspecified visual loss: Secondary | ICD-10-CM

## 2017-02-24 DIAGNOSIS — R51 Headache: Secondary | ICD-10-CM | POA: Diagnosis not present

## 2017-02-24 DIAGNOSIS — Z3041 Encounter for surveillance of contraceptive pills: Secondary | ICD-10-CM | POA: Diagnosis not present

## 2017-02-24 DIAGNOSIS — R519 Headache, unspecified: Secondary | ICD-10-CM

## 2017-02-24 LAB — POCT URINALYSIS DIP (DEVICE)
BILIRUBIN URINE: NEGATIVE
Glucose, UA: NEGATIVE mg/dL
HGB URINE DIPSTICK: NEGATIVE
LEUKOCYTES UA: NEGATIVE
Nitrite: NEGATIVE
Protein, ur: NEGATIVE mg/dL
Urobilinogen, UA: 1 mg/dL (ref 0.0–1.0)
pH: 7 (ref 5.0–8.0)

## 2017-02-24 MED ORDER — SUMATRIPTAN SUCCINATE 50 MG PO TABS: 50.0000 mg | ORAL_TABLET | ORAL | 0 refills | Status: DC | PRN

## 2017-02-24 MED ORDER — NORETHINDRONE 0.35 MG PO TABS: 1.0000 | ORAL_TABLET | Freq: Every day | ORAL | 11 refills | Status: DC

## 2017-02-24 NOTE — Patient Instructions (Signed)
Complete current birth control pack and start Micronor (progestin-only) on the first day of your menstrual period.   For headaches start Imitrex 50-100 mg every 2 hours as needed at the initial onset of headache.  You may continue Aleve as needed for headaches.   [facility] will contact you to schedule your appointment for vision exam.      Migraine Headache A migraine headache is a very strong throbbing pain on one side or both sides of your head. Migraines can also cause other symptoms. Talk with your doctor about what things may bring on (trigger) your migraine headaches. Follow these instructions at home: Medicines  Take over-the-counter and prescription medicines only as told by your doctor.  Do not drive or use heavy machinery while taking prescription pain medicine.  To prevent or treat constipation while you are taking prescription pain medicine, your doctor may recommend that you: ? Drink enough fluid to keep your pee (urine) clear or pale yellow. ? Take over-the-counter or prescription medicines. ? Eat foods that are high in fiber. These include fresh fruits and vegetables, whole grains, and beans. ? Limit foods that are high in fat and processed sugars. These include fried and sweet foods. Lifestyle  Avoid alcohol.  Do not use any products that contain nicotine or tobacco, such as cigarettes and e-cigarettes. If you need help quitting, ask your doctor.  Get at least 8 hours of sleep every night.  Limit your stress. General instructions   Keep a journal to find out what may bring on your migraines. For example, write down: ? What you eat and drink. ? How much sleep you get. ? Any change in what you eat or drink. ? Any change in your medicines.  If you have a migraine: ? Avoid things that make your symptoms worse, such as bright lights. ? It may help to lie down in a dark, quiet room. ? Do not drive or use heavy machinery. ? Ask your doctor what  activities are safe for you.  Keep all follow-up visits as told by your doctor. This is important. Contact a doctor if:  You get a migraine that is different or worse than your usual migraines. Get help right away if:  Your migraine gets very bad.  You have a fever.  You have a stiff neck.  You have trouble seeing.  Your muscles feel weak or like you cannot control them.  You start to lose your balance a lot.  You start to have trouble walking.  You pass out (faint). This information is not intended to replace advice given to you by your health care provider. Make sure you discuss any questions you have with your health care provider. Document Released: 10/23/2007 Document Revised: 08/03/2015 Document Reviewed: 07/02/2015 Elsevier Interactive Patient Education  2018 [employer]

## 2017-02-24 NOTE — Progress Notes (Signed)
Patient ID: Regina AlaBriana Arielle Hegel, female    DOB: 1995-12-31, 22 y.o.   MRN: [id number]  PCP: Bing NeighborsHarris, Enis Leatherwood S, FNP  Chief Complaint  Patient presents with  . Headache  . Nasal Congestion    Subjective:  HPI Regina Warren is a 22 y.o. female presents for evaluation of chronic headaches and nasal congestion.  History significant for childbirth only.  Today of ongoing persisting normalized headaches.  New and has been occurring chronically since she was a teenager.  Ports associated symptoms of blurring of vision.  Sensitivity, nausea, or vomiting.  Is concerned that her current oral contraceptives may be precipitating headaches.  She is interested in changing oral contraceptives to a progesterone only form as she has noticed an increased frequency of headaches since starting ortho-cyclen. Headaches occur chronically regardless of day or night.. Blurring of vision worsen. Reports that she had history ophthalmology. Unable to identify any events that precipitate headaches.Headache intensity is worsened to the point ADLs and she has to lay down to get rid of headache. Social History   Socioeconomic History  . Marital status: Single    Spouse name: Not on file  . Number of children: Not on file  . Years of education: Not on file  . Highest education level: Not on file  Social Needs  . Financial resource strain: Not on file  . Food insecurity - worry: Not on file  . Food insecurity - inability: Not on file  . Transportation needs - medical: Not on file  . Transportation needs - non-medical: Not on file  Occupational History  . Not on file  Tobacco Use  . Smoking status: Never Smoker  . Smokeless tobacco: Never Used  Substance and Sexual Activity  . Alcohol use: Yes    Comment: occassisonal  . Drug use: No  . Sexual activity: Yes  Other Topics Concern  . Not on file  Social History Narrative   ** Merged History Encounter **        Family History  Problem  Relation Age of Onset  . Mental illness Neg Hx    Review of Systems Constitutional: Negative for fever, chills, diaphoresis, activity change, appetite change and fatigue. HENT: Positive for nasal congestion  Eyes:  Positive for intermittent  visual disturbance. Respiratory: Negative for cough, choking, chest tightness, shortness of breath, wheezing and stridor.  Cardiovascular: Negative for chest pain, palpitations and leg swelling. Gastrointestinal: Negative for abdominal distention. Neurological: Positive for headaches Psychiatric/Behavioral: Negative for hallucinations, behavioral problems, confusion, dysphoric mood, decreased concentration and agitation.   Patient Active Problem List   Diagnosis Date Noted  . Elevated BP 03/14/2014    No Known Allergies  Prior to Admission medications   Medication Sig Start Date End Date Taking? Authorizing Provider  metroNIDAZOLE (FLAGYL) 500 MG tablet Take 1 tablet (500 mg total) by mouth 2 (two) times daily with a meal. DO NOT CONSUME ALCOHOL WHILE TAKING THIS MEDICATION. Patient not taking: Reported on 02/24/2017 11/14/16   Bing NeighborsHarris, Annajulia Lewing S, FNP  naproxen (NAPROSYN) 500 MG tablet Take 1 tablet (500 mg total) by mouth 2 (two) times daily with a meal. Patient not taking: Reported on 02/24/2017 05/07/16   Bing NeighborsHarris, Tevon Berhane S, FNP  nitrofurantoin, macrocrystal-monohydrate, (MACROBID) 100 MG capsule Take 1 capsule (100 mg total) by mouth 2 (two) times daily. Patient not taking: Reported on 02/24/2017 11/14/16   Bing NeighborsHarris, Kendale Rembold S, FNP  norethindrone (MICRONOR,CAMILA,ERRIN) 0.35 MG tablet TAKE 1 TABLET(0.35 MG) BY MOUTH DAILY Patient not taking:  Reported on 02/24/2017 11/04/16   Bing Neighbors, FNP    Past Medical, Surgical Family and Social History reviewed and updated.    Objective:   Today's Vitals   02/24/17 1349  BP: 125/84  Pulse: 84  Resp: 16  Temp: 98 F (36.7 C)  TempSrc: Oral  SpO2: 100%  Weight: 154 lb (69.9 kg)  Height: 5'  5" (1.651 m)    Wt Readings from Last 3 Encounters:  02/24/17 154 lb (69.9 kg)  11/14/16 153 lb 6.4 oz (69.6 kg)  11/04/16 156 lb (70.8 kg)    Physical Exam Physical Exam: Constitutional: Patient appears well-developed and well-nourished. No distress. HENT: Normocephalic, atraumatic, External right and left ear normal. Oropharynx is clear and moist.  Eyes: Conjunctivae and EOM are normal. PERRLA, no scleral icterus. Neck: Normal ROM. Neck supple. No JVD. No tracheal deviation. No thyromegaly. CVS: RRR, S1/S2 +, no murmurs, no gallops, no carotid bruit.  Pulmonary: Effort and breath sounds normal, no stridor, rhonchi, wheezes, rales.  Abdominal: Soft. BS +, no distension, tenderness, rebound or guarding.  Musculoskeletal: Normal range of motion. No edema and no tenderness.  Lymphadenopathy: No lymphadenopathy noted, cervical, inguinal or axillary Neuro: Alert. Normal gait, strength, muscle tone, and coordination.  Skin: Skin is warm and dry. No rash noted. Not diaphoretic. No erythema. No pallor. Psychiatric: Normal mood and affect. Behavior, judgment, thought content normal.    Assessment & Plan:  1. Nonintractable headache, unspecified chronicity pattern, unspecified headache type, diff dx for headaches include migraine, tension, medication induced, or stress-induced.  Suspect headaches are of a migraine nature as they have been occurring since adolescence.  However headaches are not precipitated by any specific event, food, or environmental exposure.  I will discontinue the Ortho-Cyclen and try her on progesterone only contraceptive to see if headaches reduce frequency.  For abortive therapy I will trial her on sumatriptan 50-100 mg every 2 hours not to exceed 200 mg within 24 hours at the initial onset of a severe headache.  2. Diminished vision, history of corrective lenses, however she has not worn in several years.  Will submit a referral for ophthalmology evaluation of visual  acuity.  - Ambulatory referral to [facility]  3. Contraception Surveillance- Discontinue ortho-cyclen after completion of current pill pack. Start progesterone only pills immediately after completing current pill pack.   Return for care in 6 weeks to reevaluate the effectiveness of described therapy for headache control.    Meds ordered this encounter  Medications  . SUMAtriptan (IMITREX) 50 MG tablet    Sig: Take 1-2 tablets (50-100 mg total) by mouth every 2 (two) hours as needed for migraine (Do not exceed 200 mg within 24 hour). May repeat in 2 hours if headache persists or recurs.    Dispense:  10 tablet    Refill:  0    Order Specific Question:   Supervising Provider    Answer:   Quentin Angst [id number]  . norethindrone (ORTHO MICRONOR) 0.35 MG tablet    Sig: Take 1 tablet (0.35 mg total) by mouth daily.    Dispense:  1 Package    Refill:  11    Order Specific Question:   Supervising Provider    Answer:   Quentin Angst [[id number]]       [id number] B. Tiburcio Pea, MSN, FNP-C [facility]  [address], [place] [address] [phone]

## 2017-03-16 ENCOUNTER — Telehealth: Payer: Self-pay

## 2017-03-16 DIAGNOSIS — R519 Headache, unspecified: Secondary | ICD-10-CM

## 2017-03-16 DIAGNOSIS — R51 Headache: Principal | ICD-10-CM

## 2017-03-16 NOTE — Telephone Encounter (Signed)
Patient states that the birth control that was sent to pharmacy was the same one that she was already on. Please advise

## 2017-03-17 MED ORDER — NORETHINDRONE 0.35 MG PO TABS: 1.0000 | ORAL_TABLET | Freq: Every day | ORAL | 11 refills | Status: DC

## 2017-03-17 NOTE — Telephone Encounter (Signed)
Contacted patient and verified that she is indeed taking progesterone only oral contraceptive and new prescription was sent to pharmacy is ready for pick-up a [facility]. She continues to have HA and is unable to tolerate the sumatriptan due the sensation that her "brain is swelling and dizziness". Medication listed as a intolerance. I am referring patient to the headache clinic for further evaluation of recurring headaches. Recent CT of Head 12/2016 was negative of acute or chronic changes.    Godfrey PickKimberly S. Tiburcio PeaHarris, MSN, FNP-C [facility]  [address] [place], [place] [address] [phone]

## 2017-03-17 NOTE — Addendum Note (Signed)
Addended by: Bing NeighborsHARRIS, Neftaly Swiss S on: 03/17/2017 01:09 PM   Modules accepted: Orders

## 2017-04-09 ENCOUNTER — Telehealth: Payer: Self-pay

## 2017-04-09 NOTE — Telephone Encounter (Signed)
Patient has not been scheduled with [facility] as she has not called back to verify her insurance

## 2017-05-05 ENCOUNTER — Ambulatory Visit: Payer: BLUE CROSS/BLUE SHIELD | Admitting: Family Medicine

## 2017-08-12 ENCOUNTER — Encounter: Payer: Self-pay | Admitting: Family Medicine

## 2017-08-12 ENCOUNTER — Ambulatory Visit (INDEPENDENT_AMBULATORY_CARE_PROVIDER_SITE_OTHER): Payer: Medicaid Other | Admitting: Family Medicine

## 2017-08-12 VITALS — BP 126/84 | HR 72 | Temp 98.2°F | Ht 65.0 in | Wt 155.8 lb

## 2017-08-12 DIAGNOSIS — Z0000 Encounter for general adult medical examination without abnormal findings: Secondary | ICD-10-CM

## 2017-08-12 DIAGNOSIS — R319 Hematuria, unspecified: Secondary | ICD-10-CM | POA: Diagnosis not present

## 2017-08-12 DIAGNOSIS — R8290 Unspecified abnormal findings in urine: Secondary | ICD-10-CM

## 2017-08-12 DIAGNOSIS — N926 Irregular menstruation, unspecified: Secondary | ICD-10-CM

## 2017-08-12 DIAGNOSIS — N390 Urinary tract infection, site not specified: Secondary | ICD-10-CM | POA: Diagnosis not present

## 2017-08-12 DIAGNOSIS — Z09 Encounter for follow-up examination after completed treatment for conditions other than malignant neoplasm: Secondary | ICD-10-CM | POA: Diagnosis not present

## 2017-08-12 LAB — POCT URINALYSIS DIP (MANUAL ENTRY)
Bilirubin, UA: NEGATIVE
Glucose, UA: NEGATIVE mg/dL
Ketones, POC UA: NEGATIVE mg/dL
Nitrite, UA: NEGATIVE
Protein Ur, POC: NEGATIVE mg/dL
Spec Grav, UA: 1.025 (ref 1.010–1.025)
Urobilinogen, UA: 0.2 E.U./dL
pH, UA: 5.5 (ref 5.0–8.0)

## 2017-08-12 LAB — POCT URINE PREGNANCY: Preg Test, Ur: NEGATIVE

## 2017-08-12 MED ORDER — SULFAMETHOXAZOLE-TRIMETHOPRIM 800-160 MG PO TABS: 1.0000 | ORAL_TABLET | Freq: Two times a day (BID) | ORAL | 0 refills | Status: DC

## 2017-08-12 NOTE — Progress Notes (Signed)
Annual Physical  Subjective:    Patient ID: Regina Warren, female    DOB: 1995-06-02, 22 y.o.   MRN: [id number]   PCP: Raliegh Ip, NP  Chief Complaint  Patient presents with  . Annual Exam   HPI  Regina Warren does not have a past medical history. She is here today for her annual physical.   Current Status: Since her last office visit, she is doing well with no complaints.   She denies fevers, chills, fatigue, recent infections, weight loss, and night sweats.   Reports headaches, which she takes Acetaminophen for relief. She has not had any visual changes, dizziness, and falls.   She has occasional heart palpitations. No chest pain, cough and shortness of breath reported.   She had mild abdominal post-menopausal cramps and nausea. She states that she discontinued oral birth control because of a missed period in July. States that her last menstrual period was on 07/01/2017. She does report mild 'spotting' this month. No reports of any other GI problems such as vomiting, diarrhea, and constipation. She has no reports of blood in stools, dysuria and hematuria.   No depression or anxiety.   She denies pain today.   History reviewed. No pertinent past medical history.  Family History  Problem Relation Age of Onset  . Mental illness Neg Hx     Social History   Socioeconomic History  . Marital status: Single    Spouse name: Not on file  . Number of children: Not on file  . Years of education: Not on file  . Highest education level: Not on file  Occupational History  . Not on file  Social Needs  . Financial resource strain: Not on file  . Food insecurity:    Worry: Not on file    Inability: Not on file  . Transportation needs:    Medical: Not on file    Non-medical: Not on file  Tobacco Use  . Smoking status: Never Smoker  . Smokeless tobacco: Never Used  Substance and Sexual Activity  . Alcohol use: Yes    Comment: occassisonal  . Drug use: No  . Sexual  activity: Yes  Lifestyle  . Physical activity:    Days per week: Not on file    Minutes per session: Not on file  . Stress: Not on file  Relationships  . Social connections:    Talks on phone: Not on file    Gets together: Not on file    Attends religious service: Not on file    Active member of club or organization: Not on file    Attends meetings of clubs or organizations: Not on file    Relationship status: Not on file  . Intimate partner violence:    Fear of current or ex partner: Not on file    Emotionally abused: Not on file    Physically abused: Not on file    Forced sexual activity: Not on file  Other Topics Concern  . Not on file  Social History Narrative   ** Merged History Encounter **        Past Surgical History:  Procedure Laterality Date  . lung    . lung puncture     Immunization History  Administered Date(s) Administered  . Influenza,inj,Quad PF,6+ Mos 01/14/2017    Current Meds  Medication Sig  . sulfamethoxazole-trimethoprim (BACTRIM DS,SEPTRA DS) 800-160 MG tablet Take 1 tablet by mouth 2 (two) times daily.   Allergies  Allergen Reactions  .  Imitrex [Sumatriptan]     "made my brain feel like it was about to explode"    BP 126/84 (BP Location: Left Arm, Patient Position: Sitting, Cuff Size: Small)   Pulse 72   Temp 98.2 F (36.8 C) (Oral)   Ht 5\' 5"  (1.651 m)   Wt 155 lb 12.8 oz (70.7 kg)   LMP 07/01/2017   SpO2 100%   BMI 25.93 kg/m   Review of Systems  Constitutional: Negative.   HENT: Negative.   Eyes: Negative.   Respiratory: Negative.   Cardiovascular: Negative.   Gastrointestinal: Negative.   Endocrine: Negative.   Genitourinary: Negative.   Musculoskeletal: Negative.   Skin: Negative.   Neurological: Negative.   Hematological: Negative.   Psychiatric/Behavioral: Negative.    Objective:   Physical Exam  Constitutional: She appears well-developed and well-nourished.  HENT:  Head: Normocephalic and atraumatic.  Right  Ear: External ear normal.  Left Ear: External ear normal.  Nose: Nose normal.  Mouth/Throat: Oropharynx is clear and moist.  Eyes: Pupils are equal, round, and reactive to light. Conjunctivae and EOM are normal.  Neck: Normal range of motion. Neck supple.  Cardiovascular: Normal rate, regular rhythm, normal heart sounds and intact distal pulses.  Pulmonary/Chest: Effort normal and breath sounds normal.  Abdominal: Soft. Bowel sounds are normal.  Musculoskeletal: Normal range of motion.  Skin: Skin is warm and dry. Capillary refill takes less than 2 seconds.  Psychiatric: She has a normal mood and affect. Her behavior is normal. Judgment and thought content normal.  Nursing note and vitals reviewed.  Assessment & Plan:   1. Annual physical exam - POCT urinalysis dipstick - POCT urine pregnancy  2. Irregular menstrual cycle We will continue to monitor irregular periods. She is planning on re-starting birth control soon.   3. Abnormal urinalysis - Urine Culture  4. Urinary tract infection with hematuria, site unspecified - sulfamethoxazole-trimethoprim (BACTRIM DS,SEPTRA DS) 800-160 MG tablet; Take 1 tablet by mouth 2 (two) times daily.  Dispense: 6 tablet; Refill: 0  5. Health care maintenance - CBC with Differential - Comprehensive metabolic panel - HIV antibody (with reflex) - RPR - GC/Chlamydia Probe Amp([facility]) - QuantiFERON-TB Gold Plus  6. Follow up She will follow up in 6 months.   Current Meds  Medication Sig  . sulfamethoxazole-trimethoprim (BACTRIM DS,SEPTRA DS) 800-160 MG tablet Take 1 tablet by mouth 2 (two) times daily.   Raliegh IpNatalie Atul Delucia,  MSN, FNP-C [facility] [address]  [place], [place] [address] [phone]

## 2017-08-14 LAB — GC/CHLAMYDIA PROBE AMP
Chlamydia trachomatis, NAA: NEGATIVE
Neisseria gonorrhoeae by PCR: NEGATIVE

## 2017-08-14 LAB — URINE CULTURE: Organism ID, Bacteria: NO GROWTH

## 2017-08-16 LAB — CBC WITH DIFFERENTIAL/PLATELET
Basophils Absolute: 0 10*3/uL (ref 0.0–0.2)
Basos: 0 %
EOS (ABSOLUTE): 0 10*3/uL (ref 0.0–0.4)
Eos: 0 %
Hematocrit: 41.3 % (ref 34.0–46.6)
Hemoglobin: 13.9 g/dL (ref 11.1–15.9)
Immature Grans (Abs): 0 10*3/uL (ref 0.0–0.1)
Immature Granulocytes: 0 %
Lymphocytes Absolute: 1.4 10*3/uL (ref 0.7–3.1)
Lymphs: 33 %
MCH: 31.9 pg (ref 26.6–33.0)
MCHC: 33.7 g/dL (ref 31.5–35.7)
MCV: 95 fL (ref 79–97)
Monocytes Absolute: 0.4 10*3/uL (ref 0.1–0.9)
Monocytes: 10 %
Neutrophils Absolute: 2.3 10*3/uL (ref 1.4–7.0)
Neutrophils: 57 %
Platelets: 281 10*3/uL (ref 150–450)
RBC: 4.36 x10E6/uL (ref 3.77–5.28)
RDW: 12.9 % (ref 12.3–15.4)
WBC: 4.1 10*3/uL (ref 3.4–10.8)

## 2017-08-16 LAB — RPR: RPR Ser Ql: NONREACTIVE

## 2017-08-16 LAB — QUANTIFERON-TB GOLD PLUS
QuantiFERON Mitogen Value: >10 IU/mL
QuantiFERON Nil Value: 0.04 IU/mL
QuantiFERON TB1 Ag Value: 0.04 IU/mL
QuantiFERON TB2 Ag Value: 0.05 IU/mL
QuantiFERON-TB Gold Plus: NEGATIVE

## 2017-08-16 LAB — COMPREHENSIVE METABOLIC PANEL
ALT: 20 IU/L (ref 0–32)
AST: 20 IU/L (ref 0–40)
Albumin/Globulin Ratio: 1.6 (ref 1.2–2.2)
Albumin: 4.6 g/dL (ref 3.5–5.5)
Alkaline Phosphatase: 64 IU/L (ref 39–117)
BUN/Creatinine Ratio: 9 (ref 9–23)
BUN: 6 mg/dL (ref 6–20)
Bilirubin Total: 0.6 mg/dL (ref 0.0–1.2)
CO2: 21 mmol/L (ref 20–29)
Calcium: 9.5 mg/dL (ref 8.7–10.2)
Chloride: 101 mmol/L (ref 96–106)
Creatinine, Ser: 0.65 mg/dL (ref 0.57–1.00)
GFR calc Af Amer: 145 mL/min/{1.73_m2} (ref >59)
GFR calc non Af Amer: 126 mL/min/{1.73_m2} (ref >59)
Globulin, Total: 2.9 g/dL (ref 1.5–4.5)
Glucose: 77 mg/dL (ref 65–99)
Potassium: 4.2 mmol/L (ref 3.5–5.2)
Sodium: 137 mmol/L (ref 134–144)
Total Protein: 7.5 g/dL (ref 6.0–8.5)

## 2017-08-16 LAB — HIV ANTIBODY (ROUTINE TESTING W REFLEX): HIV Screen 4th Generation wRfx: NONREACTIVE

## 2017-08-28 ENCOUNTER — Telehealth: Payer: Self-pay

## 2017-08-28 NOTE — Telephone Encounter (Signed)
-----   Message from Kallie LocksNatalie M Stroud, FNP sent at 08/27/2017  4:17 PM EDT ----- Regarding: 'Lab Results" Regina Warren,  Please let patient know that all labs are negative for STDs.    She will keep follow up appointment in January 2020, to call office if any acute issues arise.    Thank you.

## 2017-08-28 NOTE — Telephone Encounter (Signed)
Patient notified

## 2017-09-15 ENCOUNTER — Telehealth: Payer: Self-pay

## 2017-09-15 NOTE — Telephone Encounter (Signed)
Regina Warren spoke with patient and advise her on birth control

## 2017-09-15 NOTE — Telephone Encounter (Signed)
Left a vm for patient to callback 

## 2017-10-07 ENCOUNTER — Emergency Department (HOSPITAL_COMMUNITY)
Admission: EM | Admit: 2017-10-07 | Discharge: 2017-10-07 | Disposition: A | Discharge status: Home / Self Care | Payer: Medicaid Other | Attending: Emergency Medicine | Admitting: Emergency Medicine

## 2017-10-07 ENCOUNTER — Encounter (HOSPITAL_COMMUNITY): Payer: Self-pay | Admitting: Emergency Medicine

## 2017-10-07 ENCOUNTER — Emergency Department (HOSPITAL_COMMUNITY): Payer: Medicaid Other

## 2017-10-07 DIAGNOSIS — R0789 Other chest pain: Secondary | ICD-10-CM | POA: Diagnosis not present

## 2017-10-07 DIAGNOSIS — R079 Chest pain, unspecified: Secondary | ICD-10-CM | POA: Diagnosis not present

## 2017-10-07 LAB — BASIC METABOLIC PANEL
ANION GAP: 9 (ref 5–15)
BUN: 8 mg/dL (ref 6–20)
CHLORIDE: 106 mmol/L (ref 98–111)
CO2: 23 mmol/L (ref 22–32)
Calcium: 8.8 mg/dL — ABNORMAL LOW (ref 8.9–10.3)
Creatinine, Ser: 0.77 mg/dL (ref 0.44–1.00)
GFR calc Af Amer: >60 mL/min (ref >60)
GLUCOSE: 89 mg/dL (ref 70–99)
POTASSIUM: 3.5 mmol/L (ref 3.5–5.1)
Sodium: 138 mmol/L (ref 135–145)

## 2017-10-07 LAB — CBC
HEMATOCRIT: 38.1 % (ref 36.0–46.0)
HEMOGLOBIN: 12.7 g/dL (ref 12.0–15.0)
MCH: 32.1 pg (ref 26.0–34.0)
MCHC: 33.3 g/dL (ref 30.0–36.0)
MCV: 96.2 fL (ref 78.0–100.0)
Platelets: 265 10*3/uL (ref 150–400)
RBC: 3.96 MIL/uL (ref 3.87–5.11)
RDW: 12.1 % (ref 11.5–15.5)
WBC: 6.2 10*3/uL (ref 4.0–10.5)

## 2017-10-07 LAB — I-STAT TROPONIN, ED: Troponin i, poc: 0.02 ng/mL (ref 0.00–0.08)

## 2017-10-07 LAB — I-STAT BETA HCG BLOOD, ED (MC, WL, AP ONLY): I-stat hCG, quantitative: <5 m[IU]/mL (ref <5)

## 2017-10-07 NOTE — ED Notes (Signed)
ED Provider at bedside. 

## 2017-10-07 NOTE — Discharge Instructions (Addendum)
Ibuprofen 600 mg 3 times daily for the next 3 days.  Rest.  Up with your primary doctor if symptoms are not improving in the next 3 to 4 days, and return to the ER if symptoms significantly worsen or change.

## 2017-10-07 NOTE — ED Provider Notes (Signed)
[facility] EMERGENCY DEPARTMENT Provider Note   CSN: [id number] Arrival date & time: 10/07/17  0052     History   Chief Complaint Chief Complaint  Patient presents with  . Chest Pain    HPI Regina Warren is a 22 y.o. female.  Patient is a 22 year old female with no significant past medical history.  She presents with a 24-hour history of sharp pain in the front of her chest.  This began in the absence of any injury or trauma.  It is worse with movement and change in position.  She denies any shortness of breath, nausea, diaphoresis, or radiation to the arm or jaw.  She denies any fevers, chills, or cough.  She denies any leg swelling for pain.  She has not tried anything for her discomfort.  The history is provided by the patient.    History reviewed. No pertinent past medical history.  Patient Active Problem List   Diagnosis Date Noted  . Elevated BP 03/14/2014    Past Surgical History:  Procedure Laterality Date  . lung    . lung puncture       OB History   None      Home Medications    Prior to Admission medications   Medication Sig Start Date End Date Taking? Authorizing Provider  norethindrone (ORTHO MICRONOR) 0.35 MG tablet Take 1 tablet (0.35 mg total) by mouth daily. Patient not taking: Reported on 08/12/2017 03/17/17   Bing Neighbors, FNP  sulfamethoxazole-trimethoprim (BACTRIM DS,SEPTRA DS) 800-160 MG tablet Take 1 tablet by mouth 2 (two) times daily. 08/12/17   Kallie Locks, FNP    Family History Family History  Problem Relation Age of Onset  . Mental illness Neg Hx     Social History Social History   Tobacco Use  . Smoking status: Never Smoker  . Smokeless tobacco: Never Used  Substance Use Topics  . Alcohol use: Yes    Comment: occassisonal  . Drug use: No     Allergies   Imitrex [sumatriptan]   Review of Systems Review of Systems  All other systems reviewed and are negative.    Physical  Exam Updated Vital Signs BP 116/76 (BP Location: Right Arm)   Pulse (!) 57   Temp 98.3 F (36.8 C) (Oral)   Resp 16   LMP 09/12/2017   SpO2 100%   Physical Exam  Constitutional: She is oriented to person, place, and time. She appears well-developed and well-nourished. No distress.  HENT:  Head: Normocephalic and atraumatic.  Neck: Normal range of motion. Neck supple.  Cardiovascular: Normal rate and regular rhythm. Exam reveals no gallop and no friction rub.  No murmur heard. Pulmonary/Chest: Effort normal and breath sounds normal. No respiratory distress. She has no wheezes.  Abdominal: Soft. Bowel sounds are normal. She exhibits no distension. There is no tenderness.  Musculoskeletal: Normal range of motion.       Right lower leg: Normal. She exhibits no tenderness and no edema.       Left lower leg: Normal. She exhibits no tenderness and no edema.  Neurological: She is alert and oriented to person, place, and time.  Skin: Skin is warm and dry. She is not diaphoretic.  Nursing note and vitals reviewed.    ED Treatments / Results  Labs (all labs ordered are listed, but only abnormal results are displayed) Labs Reviewed  BASIC METABOLIC PANEL - Abnormal; Notable for the following components:      Result  Value   Calcium 8.8 (*)    All other components within normal limits  CBC  I-STAT TROPONIN, ED  I-STAT BETA HCG BLOOD, ED ([facility], [facility], [facility] ONLY)    EKG EKG Interpretation  Date/Time:  Wednesday October 07 2017 01:00:57 EDT Ventricular Rate:  64 PR Interval:  152 QRS Duration: 88 QT Interval:  396 QTC Calculation: 408 R Axis:   46 Text Interpretation:  Normal sinus rhythm with sinus arrhythmia Normal ECG Confirmed by Geoffery Lyons ([id number]) on 10/07/2017 3:05:38 AM   Radiology Dg Chest 2 View  Result Date: 10/07/2017 CLINICAL DATA:  Chest pain for 24 hours EXAM: CHEST - 2 VIEW COMPARISON:  None FINDINGS: Upper normal heart size. Mediastinal contours and pulmonary  vascularity normal. Lungs clear. No pleural effusion or pneumothorax. Pectus deformity. No acute osseous findings. IMPRESSION: No acute abnormalities. Pectus deformity. Electronically Signed   By: Ulyses Southward M.D.   On: 10/07/2017 01:25    Procedures Procedures (including critical care time)  Medications Ordered in ED Medications - No data to display   Initial Impression / Assessment and Plan / ED Course  I have reviewed the triage vital signs and the nursing notes.  Pertinent labs & imaging results that were available during my care of the patient were reviewed by me and considered in my medical decision making (see chart for details).  Patient presents with sharp chest pain that appears musculoskeletal.  It is worse with palpation.  Her EKG and troponin is negative and chest x-ray is clear.  Oxygen saturations are 100% and heart rate is in the 50s.  I highly doubt PE.  I feel as though she is appropriate for discharge with NSAIDs and follow-up.  Final Clinical Impressions(s) / ED Diagnoses   Final diagnoses:  None    ED Discharge Orders    None       Geoffery Lyons, MD 10/07/17 [phone]

## 2017-10-07 NOTE — ED Notes (Signed)
MD at bedside. 

## 2017-10-07 NOTE — ED Triage Notes (Signed)
Pt reports CP onset yesterday morning, sharp in nature. Pt reports her "sides are sore" Denies SOB, N/V.

## 2017-10-13 ENCOUNTER — Ambulatory Visit: Payer: Self-pay | Admitting: Family Medicine

## 2017-12-29 ENCOUNTER — Ambulatory Visit: Payer: Self-pay | Admitting: Family Medicine

## 2017-12-31 ENCOUNTER — Encounter: Payer: Self-pay | Admitting: Family Medicine

## 2017-12-31 ENCOUNTER — Ambulatory Visit (INDEPENDENT_AMBULATORY_CARE_PROVIDER_SITE_OTHER): Payer: Medicaid Other | Admitting: Family Medicine

## 2017-12-31 VITALS — BP 133/88 | HR 66 | Temp 98.2°F | Resp 14 | Ht 65.0 in | Wt 155.0 lb

## 2017-12-31 DIAGNOSIS — N898 Other specified noninflammatory disorders of vagina: Secondary | ICD-10-CM | POA: Diagnosis not present

## 2017-12-31 DIAGNOSIS — R102 Pelvic and perineal pain: Secondary | ICD-10-CM

## 2017-12-31 LAB — POCT URINALYSIS DIPSTICK
Bilirubin, UA: NEGATIVE
Glucose, UA: NEGATIVE
Ketones, UA: NEGATIVE
Leukocytes, UA: NEGATIVE
Nitrite, UA: NEGATIVE
Protein, UA: NEGATIVE
Spec Grav, UA: 1.02 (ref 1.010–1.025)
Urobilinogen, UA: 0.2 E.U./dL
pH, UA: 7.5 (ref 5.0–8.0)

## 2017-12-31 MED ORDER — NYSTATIN-TRIAMCINOLONE 100000-0.1 UNIT/GM-% EX OINT: 1.0000 "application " | TOPICAL_OINTMENT | Freq: Two times a day (BID) | CUTANEOUS | 0 refills | Status: DC

## 2017-12-31 NOTE — Patient Instructions (Signed)

## 2017-12-31 NOTE — Progress Notes (Signed)
Acute Office Visit  Subjective:    Patient ID: Regina Warren, female    DOB: Aug 28, 1995, 22 y.o.   MRN: [id number]  Chief Complaint  Patient presents with  . Pelvic Pain    pain in pelvic area     HPI Patient is in today for pelvic pain. Patient states that she is having discomfort externally. Not described as actual pain. Patient denies changing soaps or personal hygeine products. She endorses getting a waxing a few weeks ago and believes the wax was changed.   History reviewed. No pertinent past medical history.  Past Surgical History:  Procedure Laterality Date  . lung    . lung puncture      Family History  Problem Relation Age of Onset  . Mental illness Neg Hx     Social History   Socioeconomic History  . Marital status: Single    Spouse name: Not on file  . Number of children: Not on file  . Years of education: Not on file  . Highest education level: Not on file  Occupational History  . Not on file  Social Needs  . Financial resource strain: Not on file  . Food insecurity:    Worry: Not on file    Inability: Not on file  . Transportation needs:    Medical: Not on file    Non-medical: Not on file  Tobacco Use  . Smoking status: Never Smoker  . Smokeless tobacco: Never Used  Substance and Sexual Activity  . Alcohol use: Yes    Comment: occassisonal  . Drug use: No  . Sexual activity: Yes  Lifestyle  . Physical activity:    Days per week: Not on file    Minutes per session: Not on file  . Stress: Not on file  Relationships  . Social connections:    Talks on phone: Not on file    Gets together: Not on file    Attends religious service: Not on file    Active member of club or organization: Not on file    Attends meetings of clubs or organizations: Not on file    Relationship status: Not on file  . Intimate partner violence:    Fear of current or ex partner: Not on file    Emotionally abused: Not on file    Physically abused: Not on  file    Forced sexual activity: Not on file  Other Topics Concern  . Not on file  Social History Narrative   ** Merged History Encounter **        Outpatient Medications Prior to Visit  Medication Sig Dispense Refill  . norethindrone (ORTHO MICRONOR) 0.35 MG tablet Take 1 tablet (0.35 mg total) by mouth daily. (Patient not taking: Reported on 08/12/2017) 1 Package 11  . sulfamethoxazole-trimethoprim (BACTRIM DS,SEPTRA DS) 800-160 MG tablet Take 1 tablet by mouth 2 (two) times daily. (Patient not taking: Reported on 12/31/2017) 6 tablet 0   No facility-administered medications prior to visit.     Allergies  Allergen Reactions  . Imitrex [Sumatriptan]     "made my brain feel like it was about to explode"    Review of Systems  Gastrointestinal: Positive for abdominal pain.  Genitourinary: Negative for dysuria.       Objective:    Physical Exam  Constitutional: She is oriented to person, place, and time. She appears well-developed and well-nourished. No distress.  HENT:  Head: Normocephalic and atraumatic.  Eyes: Pupils are equal, round, and  reactive to light. Conjunctivae and EOM are normal.  Neck: Normal range of motion.  Cardiovascular: Normal rate, regular rhythm and normal heart sounds.  Pulmonary/Chest: Effort normal and breath sounds normal. No respiratory distress.  Abdominal: Soft. Bowel sounds are normal. She exhibits no distension.  Genitourinary:     Musculoskeletal: Normal range of motion.  Neurological: She is alert and oriented to person, place, and time.  Skin: Skin is warm and dry.  Psychiatric: She has a normal mood and affect. Her behavior is normal. Judgment and thought content normal.  Nursing note and vitals reviewed.   BP 133/88 (BP Location: Left Arm, Patient Position: Sitting, Cuff Size: Normal)   Pulse 66   Temp 98.2 F (36.8 C) (Oral)   Resp 14   Ht 5\' 5"  (1.651 m)   Wt 155 lb (70.3 kg)   LMP 12/13/2017   SpO2 100%   BMI 25.79 kg/m    Wt Readings from Last 3 Encounters:  12/31/17 155 lb (70.3 kg)  08/12/17 155 lb 12.8 oz (70.7 kg)  02/24/17 154 lb (69.9 kg)    Health Maintenance Due  Topic Date Due  . INFLUENZA VACCINE  08/27/2017    There are no preventive care reminders to display for this patient.   Lab Results  Component Value Date   TSH 2.39 05/07/2016   Lab Results  Component Value Date   WBC 6.2 10/07/2017   HGB 12.7 10/07/2017   HCT 38.1 10/07/2017   MCV 96.2 10/07/2017   PLT 265 10/07/2017   Lab Results  Component Value Date   NA 138 10/07/2017   K 3.5 10/07/2017   CO2 23 10/07/2017   GLUCOSE 89 10/07/2017   BUN 8 10/07/2017   CREATININE 0.77 10/07/2017   BILITOT 0.6 08/12/2017   ALKPHOS 64 08/12/2017   AST 20 08/12/2017   ALT 20 08/12/2017   PROT 7.5 08/12/2017   ALBUMIN 4.6 08/12/2017   CALCIUM 8.8 (L) 10/07/2017   ANIONGAP 9 10/07/2017   No results found for: CHOL No results found for: HDL No results found for: LDLCALC No results found for: TRIG No results found for: CHOLHDL No results found for: ZOXW9U     Assessment & Plan:   Problem List Items Addressed This Visit    None    Visit Diagnoses    Pelvic pain    -  Primary   Relevant Orders   Urinalysis Dipstick (Completed)   Chlamydia/GC NAA, Confirmation   Vaginitis/Vaginosis, DNA Probe   Urine Culture   Vaginal irritation       Relevant Medications   nystatin-triamcinolone ointment (MYCOLOG)       Meds ordered this encounter  Medications  . nystatin-triamcinolone ointment (MYCOLOG)    Sig: Apply 1 application topically 2 (two) times daily.    Dispense:  30 g    Refill:  0   The patient was given clear instructions to go to ER or return to medical center if symptoms do not improve, worsen or new problems develop. The patient verbalized understanding and agreed with plan of care.      Mike Gip, FNP

## 2018-01-02 LAB — URINE CULTURE: Organism ID, Bacteria: NO GROWTH

## 2018-01-02 LAB — VAGINITIS/VAGINOSIS, DNA PROBE
Candida Species: NEGATIVE
Gardnerella vaginalis: NEGATIVE
Trichomonas vaginosis: NEGATIVE

## 2018-01-03 LAB — CHLAMYDIA/GC NAA, CONFIRMATION
Chlamydia trachomatis, NAA: NEGATIVE
Neisseria gonorrhoeae, NAA: NEGATIVE

## 2018-01-05 ENCOUNTER — Telehealth (HOSPITAL_COMMUNITY): Payer: Self-pay | Admitting: Family Medicine

## 2018-01-05 ENCOUNTER — Ambulatory Visit (HOSPITAL_COMMUNITY)
Admission: EM | Admit: 2018-01-05 | Discharge: 2018-01-05 | Disposition: A | Discharge status: Home / Self Care | Payer: Medicaid Other | Attending: Family Medicine | Admitting: Family Medicine

## 2018-01-05 ENCOUNTER — Encounter (HOSPITAL_COMMUNITY): Payer: Self-pay

## 2018-01-05 DIAGNOSIS — L0291 Cutaneous abscess, unspecified: Secondary | ICD-10-CM | POA: Diagnosis not present

## 2018-01-05 MED ORDER — SULFAMETHOXAZOLE-TRIMETHOPRIM 800-160 MG PO TABS: 1.0000 | ORAL_TABLET | Freq: Two times a day (BID) | ORAL | 0 refills | Status: DC

## 2018-01-05 MED ORDER — SULFAMETHOXAZOLE-TRIMETHOPRIM 800-160 MG PO TABS: 1.0000 | ORAL_TABLET | Freq: Two times a day (BID) | ORAL | 0 refills | Status: AC

## 2018-01-05 NOTE — ED Triage Notes (Signed)
Pt present abscess on the outside of her throat that has moved in the inside.  Pt noticed this 4 days ago.

## 2018-01-05 NOTE — Discharge Instructions (Signed)
Take the antibiotic for 5 days Warm compresses to area Expect slow improvement over couple of weeks Return if worse instead of better at any time, return if you fail to improve in 4 weeks

## 2018-01-05 NOTE — ED Provider Notes (Signed)
[facility]    CSN: [id number] Arrival date & time: 01/05/18  1815     History   Chief Complaint Chief Complaint  Patient presents with  . Abscess    HPI Regina Warren is a 22 y.o. female.   HPI  Patient is here for a skin infection.  She had a small abscess on her chin.  She pressed on it and got purulence out.  Since then there is been increased swelling underneath her chin.  It is tender.  She like to have this looked at.  No difficulty eating or swallowing.  No fever.  No other symptoms.  History reviewed. No pertinent past medical history.  Patient Active Problem List   Diagnosis Date Noted  . Elevated BP 03/14/2014    Past Surgical History:  Procedure Laterality Date  . lung    . lung puncture      OB History   None      Home Medications    Prior to Admission medications   Medication Sig Start Date End Date Taking? Authorizing Provider  sulfamethoxazole-trimethoprim (BACTRIM DS,SEPTRA DS) 800-160 MG tablet Take 1 tablet by mouth 2 (two) times daily for 5 days. 01/05/18 01/10/18  Eustace MooreNelson, Yvonne Sue, MD    Family History Family History  Problem Relation Age of Onset  . Mental illness Neg Hx     Social History Social History   Tobacco Use  . Smoking status: Never Smoker  . Smokeless tobacco: Never Used  Substance Use Topics  . Alcohol use: Yes    Comment: occassisonal  . Drug use: No     Allergies   Imitrex [sumatriptan]   Review of Systems Review of Systems  Constitutional: Negative for chills and fever.  HENT: Negative for ear pain and sore throat.   Eyes: Negative for pain and visual disturbance.  Respiratory: Negative for cough and shortness of breath.   Cardiovascular: Negative for chest pain and palpitations.  Gastrointestinal: Negative for abdominal pain and vomiting.  Genitourinary: Negative for dysuria and hematuria.  Musculoskeletal: Negative for arthralgias and back pain.  Skin: Positive for wound.  Negative for color change and rash.  Neurological: Negative for seizures and syncope.  All other systems reviewed and are negative.    Physical Exam Triage Vital Signs ED Triage Vitals  Enc Vitals Group     BP 01/05/18 1831 128/68     Pulse Rate 01/05/18 1831 69     Resp 01/05/18 1831 16     Temp 01/05/18 1831 98.1 F (36.7 C)     Temp Source 01/05/18 1831 Oral     SpO2 01/05/18 1831 100 %     Weight --      Height --      Head Circumference --      Peak Flow --      Pain Score 01/05/18 1835 4     Pain Loc --      Pain Edu? --      Excl. in GC? --    No data found.  Updated Vital Signs BP 123/86 (BP Location: Right Arm)   Pulse 69   Temp 99.2 F (37.3 C) (Oral)   Resp 16   LMP 12/13/2017   SpO2 100%     Physical Exam  Constitutional: She appears well-developed and well-nourished. No distress.  HENT:  Head: Normocephalic and atraumatic.  Mouth/Throat: Oropharynx is clear and moist.  Eyes: Pupils are equal, round, and reactive to light. Conjunctivae are normal.  Neck: Normal range of motion.    Cardiovascular: Normal rate.  Pulmonary/Chest: Effort normal. No respiratory distress.  Abdominal: Soft. She exhibits no distension.  Musculoskeletal: Normal range of motion. She exhibits no edema.  Neurological: She is alert.  Skin: Skin is warm and dry.  Psychiatric: She has a normal mood and affect. Her behavior is normal.     UC Treatments / Results  Labs (all labs ordered are listed, but only abnormal results are displayed) Labs Reviewed - No data to display  EKG None  Radiology No results found.  Procedures Procedures (including critical care time)  Medications Ordered in UC Medications - No data to display  Initial Impression / Assessment and Plan / UC Course  I have reviewed the triage vital signs and the nursing notes.  Pertinent labs & imaging results that were available during my care of the patient were reviewed by me and considered in my  medical decision making (see chart for details).    Patient states she is worried about cancer.  I told her that this was a skin infection with an appropriate lymph node reaction.  No evidence of cancer.  Unlikely to ever become cancer.  She can come back if she is worried, but I expect complete resolution over the next couple of weeks.  I told her swollen nodes can sometimes take 4 weeks to completely go down Final Clinical Impressions(s) / UC Diagnoses   Final diagnoses:  Abscess     Discharge Instructions     Take the antibiotic for 5 days Warm compresses to area Expect slow improvement over couple of weeks Return if worse instead of better at any time, return if you fail to improve in 4 weeks   ED Prescriptions    Medication Sig Dispense Auth. Provider   sulfamethoxazole-trimethoprim (BACTRIM DS,SEPTRA DS) 800-160 MG tablet Take 1 tablet by mouth 2 (two) times daily for 5 days. 10 tablet Eustace Moore, MD     Controlled Substance Prescriptions [place] Controlled Substance Registry consulted? Not Applicable   Eustace Moore, MD 01/05/18 [phone]

## 2018-01-05 NOTE — Telephone Encounter (Signed)
Needs pharm change

## 2018-01-06 ENCOUNTER — Ambulatory Visit (INDEPENDENT_AMBULATORY_CARE_PROVIDER_SITE_OTHER): Payer: Medicaid Other | Admitting: Family Medicine

## 2018-01-06 DIAGNOSIS — Z23 Encounter for immunization: Secondary | ICD-10-CM | POA: Diagnosis not present

## 2018-01-19 ENCOUNTER — Encounter (HOSPITAL_COMMUNITY): Payer: Self-pay | Admitting: *Deleted

## 2018-01-19 ENCOUNTER — Ambulatory Visit (HOSPITAL_COMMUNITY)
Admission: EM | Admit: 2018-01-19 | Discharge: 2018-01-19 | Disposition: A | Discharge status: Home / Self Care | Payer: Medicaid Other | Attending: Family Medicine | Admitting: Family Medicine

## 2018-01-19 ENCOUNTER — Other Ambulatory Visit: Payer: Self-pay

## 2018-01-19 DIAGNOSIS — R253 Fasciculation: Secondary | ICD-10-CM | POA: Diagnosis not present

## 2018-01-19 NOTE — ED Provider Notes (Signed)
[facility]    CSN: [id number] Arrival date & time: 01/19/18  0825     History   Chief Complaint Chief Complaint  Patient presents with  . Abdominal Cramping    HPI Regina Warren is a 22 y.o. female.   HPI  Patient states that she woke up in the middle of the night with a fluttering sensation in her right upper abdomen.  It persisted until now.  It is bothering her because she is worried about it.  She looked up on [employer].  She states her hand on her abdomen she can feel this fluttering.  She looked at her abdomen in the mirror and she can actually see it from the outside.  She also noticed that her umbilical hernia still a bit larger. She has no abdominal pain.  No trauma.  No exercise.  No nausea or vomiting.  Normal appetite.  Normal bowels.  No diarrhea.  No fever chills.  No recent illness.  History reviewed. No pertinent past medical history.  Patient Active Problem List   Diagnosis Date Noted  . Elevated BP 03/14/2014    Past Surgical History:  Procedure Laterality Date  . lung puncture      OB History   No obstetric history on file.      Home Medications    Prior to Admission medications   Not on File    Family History Family History  Problem Relation Age of Onset  . Mental illness Neg Hx     Social History Social History   Tobacco Use  . Smoking status: Never Smoker  . Smokeless tobacco: Never Used  Substance Use Topics  . Alcohol use: Yes    Comment: occasionally  . Drug use: Never     Allergies   Imitrex [sumatriptan]   Review of Systems Review of Systems  Constitutional: Negative for chills and fever.  HENT: Negative for ear pain and sore throat.   Eyes: Negative for pain and visual disturbance.  Respiratory: Negative for cough and shortness of breath.   Cardiovascular: Negative for chest pain and palpitations.  Gastrointestinal: Negative for abdominal pain and vomiting.  Genitourinary: Negative for  dysuria and hematuria.  Musculoskeletal: Negative for arthralgias and back pain.  Skin: Negative for color change and rash.  Neurological: Negative for seizures and syncope.  All other systems reviewed and are negative.    Physical Exam Triage Vital Signs ED Triage Vitals  Enc Vitals Group     BP 01/19/18 0845 131/90     Pulse Rate 01/19/18 0845 64     Resp 01/19/18 0845 16     Temp 01/19/18 0845 98 F (36.7 C)     Temp Source 01/19/18 0845 Oral     SpO2 01/19/18 0845 100 %   No data found.  Updated Vital Signs BP 131/90 (BP Location: Left Arm)   Pulse 64   Temp 98 F (36.7 C) (Oral)   Resp 16   LMP 01/14/2018 (Exact Date)   SpO2 100%      Physical Exam Constitutional:      General: She is not in acute distress.    Appearance: She is well-developed.  HENT:     Head: Normocephalic and atraumatic.  Eyes:     Conjunctiva/sclera: Conjunctivae normal.     Pupils: Pupils are equal, round, and reactive to light.  Neck:     Musculoskeletal: Normal range of motion.  Cardiovascular:     Rate and Rhythm: Normal rate and  regular rhythm.     Heart sounds: Normal heart sounds.  Pulmonary:     Effort: Pulmonary effort is normal. No respiratory distress.     Breath sounds: Normal breath sounds.  Abdominal:     General: Abdomen is flat. There is no distension.     Palpations: Abdomen is soft.     Tenderness: There is no abdominal tenderness.     Hernia: A hernia is present.     Comments: With palpation of the abdomen you can feel a slight muscle fasciculation in the right upper muscles where they attach into the ribs.  Normal organomegaly.  No mass.  No tenderness.  Her umbilical hernia is perhaps a centimeter across, nontender, reducible  Musculoskeletal: Normal range of motion.  Lymphadenopathy:     Cervical: No cervical adenopathy.  Skin:    General: Skin is warm and dry.  Neurological:     Mental Status: She is alert.      UC Treatments / Results  Labs (all labs  ordered are listed, but only abnormal results are displayed) Labs Reviewed - No data to display  EKG None  Radiology No results found.  Procedures Procedures (including critical care time)  Medications Ordered in UC Medications - No data to display  Initial Impression / Assessment and Plan / UC Course  I have reviewed the triage vital signs and the nursing notes.  Pertinent labs & imaging results that were available during my care of the patient were reviewed by me and considered in my medical decision making (see chart for details).    Reassured patient of benign nature of her muscle twitching.  Umbilical hernia is not large enough to require repair, and is not symptomatic. Final Clinical Impressions(s) / UC Diagnoses   Final diagnoses:  Muscle fasciculation     Discharge Instructions     You are having a muscle twitch in your abdomen This Is not serious You can try some stretching or a heating pad It should go away on its own See your doctor if not better in a couple of days   ED Prescriptions    None     Controlled Substance Prescriptions [place] Controlled Substance Registry consulted? Not Applicable   Eustace MooreNelson, Grayce Budden Sue, MD 01/19/18 1038

## 2018-01-19 NOTE — ED Triage Notes (Signed)
C/O waking in the middle of the night with right upper abd fluttering sensation.  States "it's not my heart", but feels it intermittently.  Able to palpate sensations externally.  Denies any pain.  C/O small umbilical hernia being a little larger than usual, but easily reduceable.

## 2018-01-19 NOTE — Discharge Instructions (Addendum)
You are having a muscle twitch in your abdomen This Is not serious You can try some stretching or a heating pad It should go away on its own See your doctor if not better in a couple of days

## 2018-02-12 ENCOUNTER — Ambulatory Visit: Payer: Self-pay | Admitting: Family Medicine

## 2018-02-23 ENCOUNTER — Other Ambulatory Visit: Payer: Medicaid Other

## 2018-03-29 ENCOUNTER — Ambulatory Visit: Payer: Self-pay | Admitting: Family Medicine

## 2018-03-30 ENCOUNTER — Encounter: Payer: Self-pay | Admitting: Family Medicine

## 2018-03-30 ENCOUNTER — Ambulatory Visit (INDEPENDENT_AMBULATORY_CARE_PROVIDER_SITE_OTHER): Payer: Medicaid Other | Admitting: Family Medicine

## 2018-03-30 VITALS — BP 124/94 | HR 88 | Temp 98.0°F | Ht 65.0 in | Wt 156.0 lb

## 2018-03-30 DIAGNOSIS — R102 Pelvic and perineal pain unspecified side: Secondary | ICD-10-CM | POA: Insufficient documentation

## 2018-03-30 DIAGNOSIS — Z09 Encounter for follow-up examination after completed treatment for conditions other than malignant neoplasm: Secondary | ICD-10-CM

## 2018-03-30 DIAGNOSIS — N898 Other specified noninflammatory disorders of vagina: Secondary | ICD-10-CM | POA: Diagnosis not present

## 2018-03-30 LAB — POCT URINALYSIS DIP (MANUAL ENTRY)
Bilirubin, UA: NEGATIVE
Blood, UA: NEGATIVE
Glucose, UA: NEGATIVE mg/dL
Leukocytes, UA: NEGATIVE
Nitrite, UA: NEGATIVE
Spec Grav, UA: 1.015 (ref 1.010–1.025)
Urobilinogen, UA: 1 E.U./dL
pH, UA: 8.5 — AB (ref 5.0–8.0)

## 2018-03-30 NOTE — Progress Notes (Signed)
[facility]  Sick Visit  Subjective:  Patient ID: Regina Warren, female    DOB: [birth year]  Age: 23 y.o. MRN: [id number]  CC:  Chief Complaint  Patient presents with  . Pelvic Pain  . Vaginal Discharge    HPI Regina Warren is a 23 year old female who presents for   History reviewed. No pertinent past medical history. Current Status: Since her last office visit, she has c/o lower abdominal discomfort X 2 weeks intermittently. She has not taken any OTC medications for relief. She has noticed a discharge. She denies urinary frequency, dysuria, urinary itching, burning, odor, and hematuria.    She denies fevers, chills, fatigue, recent infections, weight loss, and night sweats. She has not had any headaches, visual changes, dizziness, and falls. No chest pain, heart palpitations, cough and shortness of breath reported. No reports of GI problems such as nausea, vomiting, diarrhea, and constipation. She has no reports of blood in stools, dysuria and hematuria. No depression or anxiety reported. She denies pain today.   Past Surgical History:  Procedure Laterality Date  . lung puncture      Family History  Problem Relation Age of Onset  . Mental illness Neg Hx     Social History   Socioeconomic History  . Marital status: Single    Spouse name: Not on file  . Number of children: Not on file  . Years of education: Not on file  . Highest education level: Not on file  Occupational History  . Not on file  Social Needs  . Financial resource strain: Not on file  . Food insecurity:    Worry: Not on file    Inability: Not on file  . Transportation needs:    Medical: Not on file    Non-medical: Not on file  Tobacco Use  . Smoking status: Never Smoker  . Smokeless tobacco: Never Used  Substance and Sexual Activity  . Alcohol use: Yes    Comment: occasionally  . Drug use: Never  . Sexual activity: Not  Currently  Lifestyle  . Physical activity:    Days per week: Not on file    Minutes per session: Not on file  . Stress: Not on file  Relationships  . Social connections:    Talks on phone: Not on file    Gets together: Not on file    Attends religious service: Not on file    Active member of club or organization: Not on file    Attends meetings of clubs or organizations: Not on file    Relationship status: Not on file  . Intimate partner violence:    Fear of current or ex partner: Not on file    Emotionally abused: Not on file    Physically abused: Not on file    Forced sexual activity: Not on file  Other Topics Concern  . Not on file  Social History Narrative   ** Merged History Encounter **        No outpatient medications prior to visit.   No facility-administered medications prior to visit.     Allergies  Allergen Reactions  . Imitrex [Sumatriptan]     "made my brain feel like it was about to explode"    ROS Review of Systems  Constitutional: Negative.   HENT: Negative.   Eyes: Negative.   Respiratory: Negative.   Cardiovascular: Negative.   Gastrointestinal:  Lower abdominal discomfort.   Endocrine: Negative.   Genitourinary: Negative.   Musculoskeletal: Negative.   Skin: Negative.   Allergic/Immunologic: Negative.   Neurological: Negative.   Hematological: Negative.   Psychiatric/Behavioral: Negative.    Objective:    Physical Exam  Constitutional: She is oriented to person, place, and time. She appears well-developed and well-nourished.  HENT:  Head: Normocephalic and atraumatic.  Eyes: Conjunctivae are normal.  Neck: Normal range of motion. Neck supple.  Cardiovascular: Normal rate, regular rhythm, normal heart sounds and intact distal pulses.  Pulmonary/Chest: Effort normal and breath sounds normal.  Abdominal: Soft. Bowel sounds are normal.  Musculoskeletal: Normal range of motion.  Neurological: She is alert and oriented to person,  place, and time. She has normal reflexes.  Skin: Skin is warm and dry.  Psychiatric: She has a normal mood and affect. Her behavior is normal. Judgment and thought content normal.  Nursing note and vitals reviewed.   BP (!) 124/94 (BP Location: Left Arm, Patient Position: Sitting, Cuff Size: Small)   Pulse 88   Temp 98 F (36.7 C) (Oral)   Ht 5\' 5"  (1.651 m)   Wt 156 lb (70.8 kg)   SpO2 100%   BMI 25.96 kg/m  Wt Readings from Last 3 Encounters:  03/30/18 156 lb (70.8 kg)  01/07/18 155 lb (70.3 kg)  12/31/17 155 lb (70.3 kg)   There are no preventive care reminders to display for this patient.  There are no preventive care reminders to display for this patient.  Lab Results  Component Value Date   TSH 2.39 05/07/2016   Lab Results  Component Value Date   WBC 6.2 10/07/2017   HGB 12.7 10/07/2017   HCT 38.1 10/07/2017   MCV 96.2 10/07/2017   PLT 265 10/07/2017   Lab Results  Component Value Date   NA 138 10/07/2017   K 3.5 10/07/2017   CO2 23 10/07/2017   GLUCOSE 89 10/07/2017   BUN 8 10/07/2017   CREATININE 0.77 10/07/2017   BILITOT 0.6 08/12/2017   ALKPHOS 64 08/12/2017   AST 20 08/12/2017   ALT 20 08/12/2017   PROT 7.5 08/12/2017   ALBUMIN 4.6 08/12/2017   CALCIUM 8.8 (L) 10/07/2017   ANIONGAP 9 10/07/2017   No results found for: CHOL No results found for: HDL No results found for: LDLCALC No results found for: TRIG No results found for: CHOLHDL No results found for: RPRX4V  Assessment & Plan:   1. Pelvic pain Reports lower abdominal discomfort. She will contact office if pain worsens.   2. Vaginal discharge Results are pending.  - POCT urinalysis dipstick - NuSwab Vaginitis Plus (VG+)  3. Follow up She will follow up as needed.   No orders of the defined types were placed in this encounter.   Orders Placed This Encounter  Procedures  . NuSwab Vaginitis Plus (VG+)  . POCT urinalysis dipstick   Referral Orders  No referral(s) requested  today    Raliegh Ip,  MSN, FNP-C [facility] [address] [place], [place] [address] [phone]   Problem List Items Addressed This Visit    None    Visit Diagnoses    Pelvic pain    -  Primary   Vaginal discharge       Relevant Orders   POCT urinalysis dipstick (Completed)   NuSwab Vaginitis Plus (VG+)   Follow up          No orders of the  defined types were placed in this encounter.   Follow-up: No follow-ups on file.    Azzie Glatter, FNP

## 2018-04-02 LAB — NUSWAB VAGINITIS PLUS (VG+)
Atopobium vaginae: HIGH Score — AB
BVAB 2: HIGH Score — AB
Candida albicans, NAA: NEGATIVE
Candida glabrata, NAA: NEGATIVE
Chlamydia trachomatis, NAA: NEGATIVE
Megasphaera 1: HIGH Score — AB
Neisseria gonorrhoeae, NAA: NEGATIVE
Trich vag by NAA: NEGATIVE

## 2018-04-05 ENCOUNTER — Telehealth: Payer: Self-pay

## 2018-04-05 NOTE — Telephone Encounter (Signed)
Patient requesting swab results

## 2018-04-06 ENCOUNTER — Other Ambulatory Visit: Payer: Self-pay | Admitting: Family Medicine

## 2018-04-06 DIAGNOSIS — B9689 Other specified bacterial agents as the cause of diseases classified elsewhere: Secondary | ICD-10-CM

## 2018-04-06 DIAGNOSIS — N760 Acute vaginitis: Principal | ICD-10-CM

## 2018-04-06 MED ORDER — METRONIDAZOLE 500 MG PO TABS: 500.0000 mg | ORAL_TABLET | Freq: Two times a day (BID) | ORAL | 0 refills | Status: DC

## 2018-04-28 ENCOUNTER — Ambulatory Visit: Payer: Self-pay | Admitting: Family Medicine

## 2018-05-03 ENCOUNTER — Telehealth: Payer: Self-pay | Admitting: Family Medicine

## 2018-05-03 ENCOUNTER — Ambulatory Visit: Payer: Self-pay | Admitting: Family Medicine

## 2018-05-03 NOTE — Telephone Encounter (Signed)
Message left on patient's voicemail to call office for Telephone Virtual office visit today.

## 2018-05-07 ENCOUNTER — Ambulatory Visit: Payer: Self-pay | Admitting: Family Medicine

## 2018-05-21 ENCOUNTER — Telehealth: Payer: Self-pay

## 2018-05-21 ENCOUNTER — Ambulatory Visit (INDEPENDENT_AMBULATORY_CARE_PROVIDER_SITE_OTHER): Payer: Medicaid Other | Admitting: Family Medicine

## 2018-05-21 ENCOUNTER — Encounter: Payer: Self-pay | Admitting: Family Medicine

## 2018-05-21 ENCOUNTER — Other Ambulatory Visit: Payer: Self-pay

## 2018-05-21 VITALS — BP 126/78 | HR 84 | Temp 98.2°F | Ht 65.0 in | Wt 162.0 lb

## 2018-05-21 DIAGNOSIS — Z09 Encounter for follow-up examination after completed treatment for conditions other than malignant neoplasm: Secondary | ICD-10-CM

## 2018-05-21 DIAGNOSIS — R1031 Right lower quadrant pain: Secondary | ICD-10-CM | POA: Diagnosis not present

## 2018-05-21 DIAGNOSIS — N390 Urinary tract infection, site not specified: Secondary | ICD-10-CM | POA: Insufficient documentation

## 2018-05-21 DIAGNOSIS — N898 Other specified noninflammatory disorders of vagina: Secondary | ICD-10-CM | POA: Diagnosis not present

## 2018-05-21 LAB — POCT URINALYSIS DIP (MANUAL ENTRY)
Bilirubin, UA: NEGATIVE
Blood, UA: NEGATIVE
Glucose, UA: NEGATIVE mg/dL
Ketones, POC UA: NEGATIVE mg/dL
Nitrite, UA: NEGATIVE
Protein Ur, POC: NEGATIVE mg/dL
Spec Grav, UA: 1.02 (ref 1.010–1.025)
Urobilinogen, UA: 0.2 E.U./dL
pH, UA: 7 (ref 5.0–8.0)

## 2018-05-21 MED ORDER — SULFAMETHOXAZOLE-TRIMETHOPRIM 800-160 MG PO TABS: 1.0000 | ORAL_TABLET | Freq: Two times a day (BID) | ORAL | 0 refills | Status: DC

## 2018-05-21 NOTE — Telephone Encounter (Signed)
Advise patient that it was okay for her to take a multi vitamin

## 2018-05-21 NOTE — Progress Notes (Signed)
[facility]   Established Patient Office Visit  Subjective:  Patient ID: Regina Warren, female    DOB: 1995/11/26  Age: 23 y.o. MRN: [id number]  CC:  Chief Complaint  Patient presents with  . Headache  . upper thigh discomfort  . Vaginal Discharge    HPI Regina Warren is a 23 year old female who presents for follow up today.    Current Status: Since her last office visit, she has c/o right lower quadrant pain X 1 week. No reports of GI problems such as nausea, vomiting, diarrhea, and constipation. She has no reports of blood in stools, dysuria and hematuria. She continues to have headaches, which she takes OTC pain medications for relief. She denies fevers, chills, fatigue, recent infections, weight loss, and night sweats. She has not had any headaches, visual changes, dizziness, and falls. No chest pain, heart palpitations, cough and shortness of breath reported. No depression or anxiety reported.    Past Medical History:  Diagnosis Date  . Abdominal pain     Past Surgical History:  Procedure Laterality Date  . lung puncture      Family History  Problem Relation Age of Onset  . Mental illness Neg Hx     Social History   Socioeconomic History  . Marital status: Single    Spouse name: Not on file  . Number of children: Not on file  . Years of education: Not on file  . Highest education level: Not on file  Occupational History  . Not on file  Social Needs  . Financial resource strain: Not on file  . Food insecurity:    Worry: Not on file    Inability: Not on file  . Transportation needs:    Medical: Not on file    Non-medical: Not on file  Tobacco Use  . Smoking status: Never Smoker  . Smokeless tobacco: Never Used  Substance and Sexual Activity  . Alcohol use: Yes    Comment: occasionally  . Drug use: Never  . Sexual activity: Not Currently  Lifestyle  . Physical activity:    Days  per week: Not on file    Minutes per session: Not on file  . Stress: Not on file  Relationships  . Social connections:    Talks on phone: Not on file    Gets together: Not on file    Attends religious service: Not on file    Active member of club or organization: Not on file    Attends meetings of clubs or organizations: Not on file    Relationship status: Not on file  . Intimate partner violence:    Fear of current or ex partner: Not on file    Emotionally abused: Not on file    Physically abused: Not on file    Forced sexual activity: Not on file  Other Topics Concern  . Not on file  Social History Narrative   ** Merged History Encounter **        Outpatient Medications Prior to Visit  Medication Sig Dispense Refill  . naproxen sodium (ALEVE) 220 MG tablet Take 220 mg by mouth.    . metroNIDAZOLE (FLAGYL) 500 MG tablet Take 1 tablet (500 mg total) by mouth 2 (two) times daily. 14 tablet 0   No facility-administered medications prior to visit.     Allergies  Allergen Reactions  . Imitrex [Sumatriptan]     "made my brain feel  like it was about to explode"    ROS Review of Systems  Constitutional: Negative.   HENT: Negative.   Eyes: Negative.   Respiratory: Negative.   Cardiovascular: Negative.   Gastrointestinal: Positive for abdominal pain (right lower quadrant).  Endocrine: Negative.   Genitourinary: Negative.   Musculoskeletal: Negative.   Skin: Negative.   Allergic/Immunologic: Negative.   Neurological: Positive for headaches (occasionally).  Hematological: Negative.   Psychiatric/Behavioral: Negative.    Objective:    Physical Exam  Constitutional: She is oriented to person, place, and time. She appears well-developed and well-nourished.  HENT:  Head: Normocephalic and atraumatic.  Eyes: Conjunctivae are normal.  Neck: Normal range of motion. Neck supple.  Cardiovascular: Normal rate, regular rhythm, normal heart sounds and intact distal pulses.   Pulmonary/Chest: Effort normal and breath sounds normal.  Abdominal: Soft. Bowel sounds are normal.  Musculoskeletal: Normal range of motion.  Neurological: She is alert and oriented to person, place, and time. She has normal reflexes.  Skin: Skin is warm and dry.  Psychiatric: She has a normal mood and affect. Her behavior is normal. Judgment and thought content normal.    BP 126/78 (BP Location: Left Arm, Patient Position: Sitting, Cuff Size: Small)   Pulse 84   Temp 98.2 F (36.8 C) (Oral)   Ht 5\' 5"  (1.651 m)   Wt 162 lb (73.5 kg)   LMP 05/06/2018   SpO2 100%   BMI 26.96 kg/m  Wt Readings from Last 3 Encounters:  05/21/18 162 lb (73.5 kg)  03/30/18 156 lb (70.8 kg)  01/07/18 155 lb (70.3 kg)     There are no preventive care reminders to display for this patient.  There are no preventive care reminders to display for this patient.  Lab Results  Component Value Date   TSH 2.39 05/07/2016   Lab Results  Component Value Date   WBC 6.2 10/07/2017   HGB 12.7 10/07/2017   HCT 38.1 10/07/2017   MCV 96.2 10/07/2017   PLT 265 10/07/2017   Lab Results  Component Value Date   NA 138 10/07/2017   K 3.5 10/07/2017   CO2 23 10/07/2017   GLUCOSE 89 10/07/2017   BUN 8 10/07/2017   CREATININE 0.77 10/07/2017   BILITOT 0.6 08/12/2017   ALKPHOS 64 08/12/2017   AST 20 08/12/2017   ALT 20 08/12/2017   PROT 7.5 08/12/2017   ALBUMIN 4.6 08/12/2017   CALCIUM 8.8 (L) 10/07/2017   ANIONGAP 9 10/07/2017   No results found for: CHOL No results found for: HDL No results found for: LDLCALC No results found for: TRIG No results found for: CHOLHDL No results found for: MWNU2VHGBA1C    Assessment & Plan:   1. Right lower quadrant abdominal pain - US Abdomen Complete; Future  2. Vaginal discharge Swab results are pending.  - POCT urinalysis dipstick - NuSwab Vaginitis Plus (VG+)  3. Urinary tract infection without hematuria, site unspecified We will initiate Septra today.  -  sulfamethoxazole-trimethoprim (BACTRIM DS) 800-160 MG tablet; Take 1 tablet by mouth 2 (two) times daily.  Dispense: 14 tablet; Refill: 0  4. Follow up She will follow up in 3 months.   Meds ordered this encounter  Medications  . sulfamethoxazole-trimethoprim (BACTRIM DS) 800-160 MG tablet    Sig: Take 1 tablet by mouth 2 (two) times daily.    Dispense:  14 tablet    Refill:  0    Orders Placed This Encounter  Procedures  . US Abdomen Complete  .  NuSwab Vaginitis Plus (VG+)  . POCT urinalysis dipstick    Referral Orders  No referral(s) requested today    Raliegh Ip,  MSN, FNP-C [facility] [address] [place], [place] [address] [phone]    Problem List Items Addressed This Visit      Other   Vaginal discharge   Relevant Orders   POCT urinalysis dipstick (Completed)   NuSwab Vaginitis Plus (VG+)    Other Visit Diagnoses    Right lower quadrant abdominal pain    -  Primary   Relevant Orders   US Abdomen Complete   Urinary tract infection without hematuria, site unspecified       Relevant Medications   sulfamethoxazole-trimethoprim (BACTRIM DS) 800-160 MG tablet   Follow up          Meds ordered this encounter  Medications  . sulfamethoxazole-trimethoprim (BACTRIM DS) 800-160 MG tablet    Sig: Take 1 tablet by mouth 2 (two) times daily.    Dispense:  14 tablet    Refill:  0    Follow-up: Return in about 3 months (around 08/20/2018).    Kallie Locks, FNP

## 2018-05-21 NOTE — Patient Instructions (Signed)
Urinary Tract Infection, Adult A urinary tract infection (UTI) is an infection of any part of the urinary tract. The urinary tract includes:  The kidneys.  The ureters.  The bladder.  The urethra. These organs make, store, and get rid of pee (urine) in the body. What are the causes? This is caused by germs (bacteria) in your genital area. These germs grow and cause swelling (inflammation) of your urinary tract. What increases the risk? You are more likely to develop this condition if:  You have a small, thin tube (catheter) to drain pee.  You cannot control when you pee or poop (incontinence).  You are female, and: ? You use these methods to prevent pregnancy: ? A medicine that kills sperm (spermicide). ? A device that blocks sperm (diaphragm). ? You have low levels of a female hormone (estrogen). ? You are pregnant.  You have genes that add to your risk.  You are sexually active.  You take antibiotic medicines.  You have trouble peeing because of: ? A prostate that is bigger than normal, if you are female. ? A blockage in the part of your body that drains pee from the bladder (urethra). ? A kidney stone. ? A nerve condition that affects your bladder (neurogenic bladder). ? Not getting enough to drink. ? Not peeing often enough.  You have other conditions, such as: ? Diabetes. ? A weak disease-fighting system (immune system). ? Sickle cell disease. ? Gout. ? Injury of the spine. What are the signs or symptoms? Symptoms of this condition include:  Needing to pee right away (urgently).  Peeing often.  Peeing small amounts often.  Pain or burning when peeing.  Blood in the pee.  Pee that smells bad or not like normal.  Trouble peeing.  Pee that is cloudy.  Fluid coming from the vagina, if you are female.  Pain in the belly or lower back. Other symptoms include:  Throwing up (vomiting).  No urge to eat.  Feeling mixed up (confused).  Being tired  and grouchy (irritable).  A fever.  Watery poop (diarrhea). How is this treated? This condition may be treated with:  Antibiotic medicine.  Other medicines.  Drinking enough water. Follow these instructions at home:  Medicines  Take over-the-counter and prescription medicines only as told by your doctor.  If you were prescribed an antibiotic medicine, take it as told by your doctor. Do not stop taking it even if you start to feel better. General instructions  Make sure you: ? Pee until your bladder is empty. ? Do not hold pee for a long time. ? Empty your bladder after sex. ? Wipe from front to back after pooping if you are a female. Use each tissue one time when you wipe.  Drink enough fluid to keep your pee pale yellow.  Keep all follow-up visits as told by your doctor. This is important. Contact a doctor if:  You do not get better after 1-2 days.  Your symptoms go away and then come back. Get help right away if:  You have very bad back pain.  You have very bad pain in your lower belly.  You have a fever.  You are sick to your stomach (nauseous).  You are throwing up. Summary  A urinary tract infection (UTI) is an infection of any part of the urinary tract.  This condition is caused by germs in your genital area.  There are many risk factors for a UTI. These include having a small, thin   tube to drain pee and not being able to control when you pee or poop.  Treatment includes antibiotic medicines for germs.  Drink enough fluid to keep your pee pale yellow. This information is not intended to replace advice given to you by your health care provider. Make sure you discuss any questions you have with your health care provider. Document Released: 07/02/2007 Document Revised: 07/23/2017 Document Reviewed: 07/23/2017 Elsevier Interactive Patient Education  2019 [employer] Sulfamethoxazole; Trimethoprim, SMX-TMP tablets What is this medicine?  SULFAMETHOXAZOLE; TRIMETHOPRIM or SMX-TMP (suhl fuh meth OK suh zohl; trye METH oh prim) is a combination of a sulfonamide antibiotic and a second antibiotic, trimethoprim. It is used to treat or prevent certain kinds of bacterial infections. It will not work for colds, flu, or other viral infections. This medicine may be used for other purposes; ask your health care provider or pharmacist if you have questions. COMMON BRAND NAME(S): Bacter-Aid DS, Bactrim, Bactrim DS, Septra, Septra DS What should I tell my health care provider before I take this medicine? They need to know if you have any of these conditions: -anemia -asthma -being treated with anticonvulsants -if you frequently drink alcohol containing drinks -kidney disease -liver disease -low level of folic acid or glucose-6-phosphate dehydrogenase -poor nutrition or malabsorption -porphyria -severe allergies -thyroid disorder -an unusual or allergic reaction to sulfamethoxazole, trimethoprim, sulfa drugs, other medicines, foods, dyes, or preservatives -pregnant or trying to get pregnant -breast-feeding How should I use this medicine? Take this medicine by mouth with a full glass of water. Follow the directions on the prescription label. Take your medicine at regular intervals. Do not take it more often than directed. Do not skip doses or stop your medicine early. Talk to your pediatrician regarding the use of this medicine in children. Special care may be needed. This medicine has been used in children as young as 2 months of age. Overdosage: If you think you have taken too much of this medicine contact a poison control center or emergency room at once. NOTE: This medicine is only for you. Do not share this medicine with others. What if I miss a dose? If you miss a dose, take it as soon as you can. If it is almost time for your next dose, take only that dose. Do not take double or extra doses. What may interact with this medicine?  Do not take this medicine with any of the following medications: -aminobenzoate potassium -dofetilide -metronidazole This medicine may also interact with the following medications: -ACE inhibitors like benazepril, enalapril, lisinopril, and ramipril -birth control pills -cyclosporine -digoxin -diuretics -indomethacin -medicines for diabetes -methenamine -methotrexate -phenytoin -potassium supplements -pyrimethamine -sulfinpyrazone -tricyclic antidepressants -warfarin This list may not describe all possible interactions. Give your health care provider a list of all the medicines, herbs, non-prescription drugs, or dietary supplements you use. Also tell them if you smoke, drink alcohol, or use illegal drugs. Some items may interact with your medicine. What should I watch for while using this medicine? Tell your doctor or health care professional if your symptoms do not improve. Drink several glasses of water a day to reduce the risk of kidney problems. Do not treat diarrhea with over the counter products. Contact your doctor if you have diarrhea that lasts more than 2 days or if it is severe and watery. This medicine can make you more sensitive to the sun. Keep out of the sun. If you cannot avoid being in the sun, wear protective clothing and use a sunscreen. Do   not use sun lamps or tanning beds/booths. What side effects may I notice from receiving this medicine? Side effects that you should report to your doctor or health care professional as soon as possible: -allergic reactions like skin rash or hives, swelling of the face, lips, or tongue -breathing problems -fever or chills, sore throat -irregular heartbeat, chest pain -joint or muscle pain -pain or difficulty passing urine -red pinpoint spots on skin -redness, blistering, peeling or loosening of the skin, including inside the mouth -unusual bleeding or bruising -unusually weak or tired -yellowing of the eyes or skin Side  effects that usually do not require medical attention (report to your doctor or health care professional if they continue or are bothersome): -diarrhea -dizziness -headache -loss of appetite -nausea, vomiting -nervousness This list may not describe all possible side effects. Call your doctor for medical advice about side effects. You may report side effects to FDA at 1-800-FDA-1088. Where should I keep my medicine? Keep out of the reach of children. Store at room temperature between 20 to 25 degrees C (68 to 77 degrees F). Protect from light. Throw away any unused medicine after the expiration date. NOTE: This sheet is a summary. It may not cover all possible information. If you have questions about this medicine, talk to your doctor, pharmacist, or health care provider.  2019 Elsevier/Gold Standard (2012-08-20 14:38:26)  

## 2018-05-24 ENCOUNTER — Telehealth: Payer: Self-pay

## 2018-05-24 NOTE — Telephone Encounter (Signed)
Patient advise to try to take antibiotic with food so she want get sick on the stomach.

## 2018-05-28 ENCOUNTER — Ambulatory Visit (HOSPITAL_BASED_OUTPATIENT_CLINIC_OR_DEPARTMENT_OTHER)
Admission: RE | Admit: 2018-05-28 | Discharge: 2018-05-28 | Disposition: A | Discharge status: Home / Self Care | Payer: Medicaid Other | Source: Ambulatory Visit | Attending: Family Medicine | Admitting: Family Medicine

## 2018-05-28 ENCOUNTER — Ambulatory Visit: Payer: Medicaid Other

## 2018-05-28 ENCOUNTER — Other Ambulatory Visit: Payer: Self-pay

## 2018-05-28 ENCOUNTER — Telehealth: Payer: Self-pay

## 2018-05-28 DIAGNOSIS — R1031 Right lower quadrant pain: Secondary | ICD-10-CM | POA: Insufficient documentation

## 2018-05-29 LAB — NUSWAB VAGINITIS PLUS (VG+)
Atopobium vaginae: HIGH Score — AB
Candida albicans, NAA: NEGATIVE
Candida glabrata, NAA: NEGATIVE
Chlamydia trachomatis, NAA: POSITIVE — AB
Megasphaera 1: HIGH Score — AB
Neisseria gonorrhoeae, NAA: NEGATIVE
Trich vag by NAA: NEGATIVE

## 2018-05-31 ENCOUNTER — Telehealth: Payer: Self-pay

## 2018-05-31 ENCOUNTER — Other Ambulatory Visit: Payer: Self-pay | Admitting: Family Medicine

## 2018-05-31 DIAGNOSIS — A749 Chlamydial infection, unspecified: Secondary | ICD-10-CM

## 2018-05-31 MED ORDER — AZITHROMYCIN 500 MG PO TABS: ORAL_TABLET | ORAL | 0 refills | Status: DC

## 2018-05-31 NOTE — Telephone Encounter (Signed)
Patient took the 2 pills of the Zithromax and states that she is having heart palpations. Patient states that she ate food right after she took medication and she is not having any other symptoms. Please advise

## 2018-06-01 NOTE — Telephone Encounter (Signed)
Patient states that she is doing well and has not been having any palpations. Patient advise to contact us if any symptoms start.

## 2018-06-09 NOTE — Telephone Encounter (Signed)
Message sent to provider 

## 2018-06-11 ENCOUNTER — Telehealth: Payer: Self-pay

## 2018-06-11 ENCOUNTER — Other Ambulatory Visit: Payer: Self-pay | Admitting: Family Medicine

## 2018-06-11 DIAGNOSIS — N760 Acute vaginitis: Secondary | ICD-10-CM

## 2018-06-11 DIAGNOSIS — B9689 Other specified bacterial agents as the cause of diseases classified elsewhere: Secondary | ICD-10-CM

## 2018-06-11 DIAGNOSIS — A749 Chlamydial infection, unspecified: Secondary | ICD-10-CM

## 2018-06-11 MED ORDER — METRONIDAZOLE 500 MG PO TABS: 500.0000 mg | ORAL_TABLET | Freq: Two times a day (BID) | ORAL | 0 refills | Status: DC

## 2018-06-11 MED ORDER — AZITHROMYCIN 500 MG PO TABS: ORAL_TABLET | ORAL | 0 refills | Status: DC

## 2018-06-11 NOTE — Telephone Encounter (Signed)
Patient is still having some vaginal itching and would like to get the second round of antibiotic sent to [place] on [address]. Patient thinks she may still have some of the Trich and would like to be tested after she completes next dose.

## 2018-06-14 ENCOUNTER — Encounter: Payer: Self-pay | Admitting: Family Medicine

## 2018-06-28 DIAGNOSIS — E559 Vitamin D deficiency, unspecified: Secondary | ICD-10-CM

## 2018-06-28 HISTORY — DX: Vitamin D deficiency, unspecified: E55.9

## 2018-07-23 ENCOUNTER — Other Ambulatory Visit: Payer: Self-pay

## 2018-07-23 ENCOUNTER — Ambulatory Visit (INDEPENDENT_AMBULATORY_CARE_PROVIDER_SITE_OTHER): Payer: Medicaid Other | Admitting: Family Medicine

## 2018-07-23 ENCOUNTER — Encounter: Payer: Self-pay | Admitting: Family Medicine

## 2018-07-23 VITALS — BP 120/80 | HR 90 | Temp 98.0°F | Ht 65.0 in | Wt 160.2 lb

## 2018-07-23 DIAGNOSIS — R519 Headache, unspecified: Secondary | ICD-10-CM

## 2018-07-23 DIAGNOSIS — R51 Headache: Secondary | ICD-10-CM

## 2018-07-23 DIAGNOSIS — G44201 Tension-type headache, unspecified, intractable: Secondary | ICD-10-CM | POA: Diagnosis not present

## 2018-07-23 DIAGNOSIS — Z09 Encounter for follow-up examination after completed treatment for conditions other than malignant neoplasm: Secondary | ICD-10-CM | POA: Diagnosis not present

## 2018-07-23 DIAGNOSIS — N898 Other specified noninflammatory disorders of vagina: Secondary | ICD-10-CM

## 2018-07-23 DIAGNOSIS — Z0000 Encounter for general adult medical examination without abnormal findings: Secondary | ICD-10-CM

## 2018-07-23 DIAGNOSIS — Z202 Contact with and (suspected) exposure to infections with a predominantly sexual mode of transmission: Secondary | ICD-10-CM

## 2018-07-23 LAB — POCT URINALYSIS DIP (MANUAL ENTRY)
Bilirubin, UA: NEGATIVE
Blood, UA: NEGATIVE
Glucose, UA: NEGATIVE mg/dL
Ketones, POC UA: NEGATIVE mg/dL
Leukocytes, UA: NEGATIVE
Nitrite, UA: NEGATIVE
Protein Ur, POC: 30 mg/dL — AB
Spec Grav, UA: 1.02 (ref 1.010–1.025)
Urobilinogen, UA: 2 E.U./dL — AB
pH, UA: 8.5 — AB (ref 5.0–8.0)

## 2018-07-23 MED ORDER — BUTALBITAL-APAP-CAFFEINE 50-325-40 MG PO TABS: 1.0000 | ORAL_TABLET | Freq: Two times a day (BID) | ORAL | 0 refills | Status: DC | PRN

## 2018-07-23 NOTE — Progress Notes (Signed)
[facility]   Sick Visit  Subjective:  Patient ID: Regina Warren, female    DOB: 1995/05/21  Age: 23 y.o. MRN: [id number]  CC:  Chief Complaint  Patient presents with  . Vaginal Discharge  . Headache    HPI Regina Warren is a 23 year old female who presents for Sick Visit today.   Past Medical History:  Diagnosis Date  . Abdominal pain    Current Status: Since her last office visit, she has c/o vaginal discharge ongoing days. She has used raw cranberry intake to aide in relieving her symptoms. She denies urinary frequency, dysuria, urinary itching, burning, odor, hematuria, and suprapubic pain/discomfort. Her headaches have become more frequent lately. She uses Aleve to aide in her headaches with minimal relief. She has increased her fluid intake in a effort to minimie headaches. She has not had any visual changes, dizziness, and falls. No chest pain, heart palpitations, cough and shortness of breath reported. Her anxiety is moderate today. She denies suicidal ideations, homicidal ideations, or auditory hallucinations.  She denies fevers, chills, fatigue, recent infections, weight loss, and night sweats. No reports of GI problems such as nausea, vomiting, diarrhea, and constipation. She has no reports of blood in stools, dysuria and hematuria. She denies pain today.   Past Surgical History:  Procedure Laterality Date  . lung puncture      Family History  Problem Relation Age of Onset  . Mental illness Neg Hx     Social History   Socioeconomic History  . Marital status: Single    Spouse name: Not on file  . Number of children: Not on file  . Years of education: Not on file  . Highest education level: Not on file  Occupational History  . Not on file  Social Needs  . Financial resource strain: Not on file  . Food insecurity    Worry: Not on file    Inability: Not on file  . Transportation needs   Medical: Not on file    Non-medical: Not on file  Tobacco Use  . Smoking status: Never Smoker  . Smokeless tobacco: Never Used  Substance and Sexual Activity  . Alcohol use: Yes    Comment: occasionally  . Drug use: Never  . Sexual activity: Not Currently  Lifestyle  . Physical activity    Days per week: Not on file    Minutes per session: Not on file  . Stress: Not on file  Relationships  . Social Musicianconnections    Talks on phone: Not on file    Gets together: Not on file    Attends religious service: Not on file    Active member of club or organization: Not on file    Attends meetings of clubs or organizations: Not on file    Relationship status: Not on file  . Intimate partner violence    Fear of current or ex partner: Not on file    Emotionally abused: Not on file    Physically abused: Not on file    Forced sexual activity: Not on file  Other Topics Concern  . Not on file  Social History Narrative   ** Merged History Encounter **        Outpatient Medications Prior to Visit  Medication Sig Dispense Refill  . naproxen sodium (ALEVE) 220 MG tablet Take 220 mg by mouth.    Marland Kitchen. azithromycin (ZITHROMAX) 500 MG tablet Take 2 tablets (total=  1,000 mg) once. 2 tablet 0  . metroNIDAZOLE (FLAGYL) 500 MG tablet Take 1 tablet (500 mg total) by mouth 2 (two) times daily with a meal. DO NOT CONSUME ALCOHOL WHILE TAKING THIS MEDICATION. 14 tablet 0  . sulfamethoxazole-trimethoprim (BACTRIM DS) 800-160 MG tablet Take 1 tablet by mouth 2 (two) times daily. 14 tablet 0   No facility-administered medications prior to visit.     Allergies  Allergen Reactions  . Imitrex [Sumatriptan]     "made my brain feel like it was about to explode"    ROS Review of Systems  Constitutional: Negative.   HENT: Negative.   Eyes: Negative.   Respiratory: Negative.   Cardiovascular: Negative.   Gastrointestinal: Negative.   Endocrine: Negative.   Genitourinary: Positive for vaginal discharge.   Musculoskeletal: Negative.   Skin: Negative.   Allergic/Immunologic: Negative.   Neurological: Positive for headaches (frequent).  Hematological: Negative.   Psychiatric/Behavioral: Negative.    Objective:    Physical Exam  Constitutional: She is oriented to person, place, and time. She appears well-developed and well-nourished.  HENT:  Head: Normocephalic and atraumatic.  Eyes: Conjunctivae are normal.  Neck: Normal range of motion. Neck supple.  Cardiovascular: Normal rate, regular rhythm, normal heart sounds and intact distal pulses.  Pulmonary/Chest: Effort normal and breath sounds normal.  Abdominal: Soft. Bowel sounds are normal.  Musculoskeletal: Normal range of motion.  Neurological: She is alert and oriented to person, place, and time. She has normal reflexes.  Skin: Skin is warm and dry.  Psychiatric: She has a normal mood and affect. Her behavior is normal. Judgment and thought content normal.  Nursing note and vitals reviewed.   BP 120/80 (BP Location: Left Arm, Patient Position: Sitting, Cuff Size: Small)   Pulse 90   Temp 98 F (36.7 C) (Oral)   Ht 5\' 5"  (1.651 m)   Wt 160 lb 3.2 oz (72.7 kg)   LMP 07/07/2018   SpO2 100%   BMI 26.66 kg/m  Wt Readings from Last 3 Encounters:  07/23/18 160 lb 3.2 oz (72.7 kg)  05/21/18 162 lb (73.5 kg)  03/30/18 156 lb (70.8 kg)     There are no preventive care reminders to display for this patient.  There are no preventive care reminders to display for this patient.  Lab Results  Component Value Date   TSH 2.39 05/07/2016   Lab Results  Component Value Date   WBC 6.2 10/07/2017   HGB 12.7 10/07/2017   HCT 38.1 10/07/2017   MCV 96.2 10/07/2017   PLT 265 10/07/2017   Lab Results  Component Value Date   NA 138 10/07/2017   K 3.5 10/07/2017   CO2 23 10/07/2017   GLUCOSE 89 10/07/2017   BUN 8 10/07/2017   CREATININE 0.77 10/07/2017   BILITOT 0.6 08/12/2017   ALKPHOS 64 08/12/2017   AST 20 08/12/2017   ALT  20 08/12/2017   PROT 7.5 08/12/2017   ALBUMIN 4.6 08/12/2017   CALCIUM 8.8 (L) 10/07/2017   ANIONGAP 9 10/07/2017   No results found for: CHOL No results found for: HDL No results found for: LDLCALC No results found for: TRIG No results found for: CHOLHDL No results found for: HGBA1C    Assessment & Plan:   1. Vaginal discharge Swab results are pending.  - POCT urinalysis dipstick - NuSwab Vaginitis Plus (VG+)  2. Intractable headache, unspecified chronicity pattern, unspecified headache type Continue Aleve as directed. We will initiate Fioricet today also. She will increase fluids.  3. Possible exposure to STD  4. Follow up She will follow up in 6 months.   Meds ordered this encounter  Medications  . butalbital-acetaminophen-caffeine (FIORICET) 50-325-40 MG tablet    Sig: Take 1 tablet by mouth 2 (two) times daily as needed for headache.    Dispense:  14 tablet    Refill:  0   Orders Placed This Encounter  Procedures  . CT HEAD W & WO CONTRAST  . NuSwab Vaginitis Plus (VG+)  . STD Screen (8)  . Vitamin D, 25-hydroxy  . Vitamin B12  . POCT urinalysis dipstick   Referral Orders  No referral(s) requested today    Raliegh IpNatalie Bianco Cange,  MSN, FNP-BC [facility] [address]  [place], [place] [id number] [phone]  Problem List Items Addressed This Visit      Other   Vaginal discharge - Primary   Relevant Orders   POCT urinalysis dipstick (Completed)   NuSwab Vaginitis Plus (VG+)    Other Visit Diagnoses    Intractable headache, unspecified chronicity pattern, unspecified headache type       Follow up          No orders of the defined types were placed in this encounter.   Follow-up: No follow-ups on file.    Kallie LocksNatalie M Kavish Lafitte, FNP

## 2018-07-23 NOTE — Patient Instructions (Signed)
General Headache Without Cause A headache is pain or discomfort that is felt around the head or neck area. There are many causes and types of headaches. In some cases, the cause may not be found. Follow these instructions at home: Watch your condition for any changes. Let your doctor know about them. Take these steps to help with your condition: Managing pain      Take over-the-counter and prescription medicines only as told by your doctor.  Lie down in a dark, quiet room when you have a headache.  If told, put ice on your head and neck area: ? Put ice in a plastic bag. ? Place a towel between your skin and the bag. ? Leave the ice on for 20 minutes, 2-3 times per day.  If told, put heat on the affected area. Use the heat source that your doctor recommends, such as a moist heat pack or a heating pad. ? Place a towel between your skin and the heat source. ? Leave the heat on for 20-30 minutes. ? Remove the heat if your skin turns bright red. This is very important if you are unable to feel pain, heat, or cold. You may have a greater risk of getting burned.  Keep lights dim if bright lights bother you or make your headaches worse. Eating and drinking  Eat meals on a regular schedule.  If you drink alcohol: ? Limit how much you use to:  0-1 drink a day for women.  0-2 drinks a day for men. ? Be aware of how much alcohol is in your drink. In the [place] one drink equals one 12 oz bottle of beer (355 mL), one 5 oz glass of wine (148 mL), or one 1 oz glass of hard liquor (44 mL).  Stop drinking caffeine, or reduce how much caffeine you drink. General instructions   Keep a journal to find out if certain things bring on headaches. For example, write down: ? What you eat and drink. ? How much sleep you get. ? Any change to your diet or medicines.  Get a massage or try other ways to relax.  Limit stress.  Sit up straight. Do not tighten (tense) your muscles.  Do not use any  products that contain nicotine or tobacco. This includes cigarettes, e-cigarettes, and chewing tobacco. If you need help quitting, ask your doctor.  Exercise regularly as told by your doctor.  Get enough sleep. This often means 7-9 hours of sleep each night.  Keep all follow-up visits as told by your doctor. This is important. Contact a doctor if:  Your symptoms are not helped by medicine.  You have a headache that feels different than the other headaches.  You feel sick to your stomach (nauseous) or you throw up (vomit).  You have a fever. Get help right away if:  Your headache gets very bad quickly.  Your headache gets worse after a lot of physical activity.  You keep throwing up.  You have a stiff neck.  You have trouble seeing.  You have trouble speaking.  You have pain in the eye or ear.  Your muscles are weak or you lose muscle control.  You lose your balance or have trouble walking.  You feel like you will pass out (faint) or you pass out.  You are mixed up (confused).  You have a seizure. Summary  A headache is pain or discomfort that is felt around the head or neck area.  There are many causes and   types of headaches. In some cases, the cause may not be found.  Keep a journal to help find out what causes your headaches. Watch your condition for any changes. Let your doctor know about them.  Contact a doctor if you have a headache that is different from usual, or if your headache is not helped by medicine.  Get help right away if your headache gets very bad, you throw up, you have trouble seeing, you lose your balance, or you have a seizure. This information is not intended to replace advice given to you by your health care provider. Make sure you discuss any questions you have with your health care provider. Document Released: 10/23/2007 Document Revised: 08/03/2017 Document Reviewed: 08/03/2017 Elsevier Interactive Patient Education  2019 Elsevier Inc.  Acetaminophen; Butalbital; Caffeine tablets or capsules What is this medicine? ACETAMINOPHEN; BUTALBITAL; CAFFEINE (a set a MEE noe fen; byoo TAL bi tal; KAF een) is a pain reliever. It is used to treat tension headaches. This medicine may be used for other purposes; ask your health care provider or pharmacist if you have questions. COMMON BRAND NAME(S): Alagesic, Americet, Anolor-300, Arcet, BAC, CAPACET, Dolgic Plus, Esgic, Esgic Plus, Ezol, Fioricet, Ryder Systemeone, Margesic, Medigesic, [place], [address] [place], Phrenilin Forte, Repan, [place], Triad, Zebutal What should I tell my health care provider before I take this medicine? They need to know if you have any of these conditions: -drug abuse or addiction -heart or circulation problems -if you often drink alcohol -kidney disease or problems going to the bathroom -liver disease -lung disease, asthma, or breathing problems -porphyria -an unusual or allergic reaction to acetaminophen, butalbital or other barbiturates, caffeine, other medicines, foods, dyes, or preservatives -pregnant or trying to get pregnant -breast-feeding How should I use this medicine? Take this medicine by mouth with a full glass of water. Follow the directions on the prescription label. If the medicine upsets your stomach, take the medicine with food or milk. Do not take more than you are told to take. Talk to your pediatrician regarding the use of this medicine in children. Special care may be needed. Overdosage: If you think you have taken too much of this medicine contact a poison control center or emergency room at once. NOTE: This medicine is only for you. Do not share this medicine with others. What if I miss a dose? If you miss a dose, take it as soon as you can. If it is almost time for your next dose, take only that dose. Do not take double or extra doses. What may interact with this medicine? -alcohol or medicines that contain alcohol -antidepressants, especially MAOIs  like isocarboxazid, phenelzine, tranylcypromine, and selegiline -antihistamines -benzodiazepines -carbamazepine -isoniazid -medicines for pain like pentazocine, buprenorphine, butorphanol, nalbuphine, tramadol, and propoxyphene -muscle relaxants -naltrexone -phenobarbital, phenytoin, and fosphenytoin -phenothiazines like perphenazine, thioridazine, chlorpromazine, mesoridazine, fluphenazine, prochlorperazine, promazine, and trifluoperazine -voriconazole This list may not describe all possible interactions. Give your health care provider a list of all the medicines, herbs, non-prescription drugs, or dietary supplements you use. Also tell them if you smoke, drink alcohol, or use illegal drugs. Some items may interact with your medicine. What should I watch for while using this medicine? Tell your doctor or health care professional if your pain does not go away, if it gets worse, or if you have new or a different type of pain. You may develop tolerance to the medicine. Tolerance means that you will need a higher dose of the medicine for pain relief. Tolerance is normal and is expected  if you take the medicine for a long time. Do not suddenly stop taking your medicine because you may develop a severe reaction. Your body becomes used to the medicine. This does NOT mean you are addicted. Addiction is a behavior related to getting and using a drug for a non-medical reason. If you have pain, you have a medical reason to take pain medicine. Your doctor will tell you how much medicine to take. If your doctor wants you to stop the medicine, the dose will be slowly lowered over time to avoid any side effects. You may get drowsy or dizzy when you first start taking the medicine or change doses. Do not drive, use machinery, or do anything that may be dangerous until you know how the medicine affects you. Stand or sit up slowly. Do not take other medicines that contain acetaminophen with this medicine. Always read  labels carefully. If you have questions, ask your doctor or pharmacist. If you take too much acetaminophen get medical help right away. Too much acetaminophen can be very dangerous and cause liver damage. Even if you do not have symptoms, it is important to get help right away. What side effects may I notice from receiving this medicine? Side effects that you should report to your doctor or health care professional as soon as possible: -allergic reactions like skin rash, itching or hives, swelling of the face, lips, or tongue -breathing problems -confusion -feeling faint or lightheaded, falls -redness, blistering, peeling or loosening of the skin, including inside the mouth -seizure -stomach pain -yellowing of the eyes or skin Side effects that usually do not require medical attention (report to your doctor or health care professional if they continue or are bothersome): -constipation -nausea, vomiting This list may not describe all possible side effects. Call your doctor for medical advice about side effects. You may report side effects to FDA at 1-800-FDA-1088. Where should I keep my medicine? Keep out of the reach of children. This medicine can be abused. Keep your medicine in a safe place to protect it from theft. Do not share this medicine with anyone. Selling or giving away this medicine is dangerous and against the law. This medicine may cause accidental overdose and death if it taken by other adults, children, or pets. Mix any unused medicine with a substance like cat litter or coffee grounds. Then throw the medicine away in a sealed container like a sealed bag or a coffee can with a lid. Do not use the medicine after the expiration date. Store at room temperature between 15 and 30 degrees C (59 and 86 degrees F). NOTE: This sheet is a summary. It may not cover all possible information. If you have questions about this medicine, talk to your doctor, pharmacist, or health care provider.   2019 Elsevier/Gold Standard (2013-03-11 15:00:25)

## 2018-07-24 ENCOUNTER — Other Ambulatory Visit: Payer: Self-pay | Admitting: Family Medicine

## 2018-07-24 ENCOUNTER — Encounter: Payer: Self-pay | Admitting: Family Medicine

## 2018-07-24 DIAGNOSIS — E559 Vitamin D deficiency, unspecified: Secondary | ICD-10-CM

## 2018-07-24 LAB — STD SCREEN (8)
HIV Screen 4th Generation wRfx: NONREACTIVE
HSV 1 Glycoprotein G Ab, IgG: <0.91 index (ref 0.00–0.90)
HSV 2 IgG, Type Spec: <0.91 index (ref 0.00–0.90)
Hep A IgM: NEGATIVE
Hep B C IgM: NEGATIVE
Hep C Virus Ab: 0.1 s/co ratio (ref 0.0–0.9)
Hepatitis B Surface Ag: NEGATIVE
RPR Ser Ql: NONREACTIVE

## 2018-07-24 LAB — VITAMIN D 25 HYDROXY (VIT D DEFICIENCY, FRACTURES): Vit D, 25-Hydroxy: 19.2 ng/mL — ABNORMAL LOW (ref 30.0–100.0)

## 2018-07-24 LAB — VITAMIN B12: Vitamin B-12: 335 pg/mL (ref 232–1245)

## 2018-07-24 MED ORDER — VITAMIN D (ERGOCALCIFEROL) 1.25 MG (50000 UNIT) PO CAPS: 50000.0000 [IU] | ORAL_CAPSULE | ORAL | 3 refills | Status: DC

## 2018-07-25 DIAGNOSIS — R519 Headache, unspecified: Secondary | ICD-10-CM | POA: Insufficient documentation

## 2018-07-27 ENCOUNTER — Telehealth: Payer: Self-pay

## 2018-07-27 ENCOUNTER — Other Ambulatory Visit: Payer: Self-pay | Admitting: Family Medicine

## 2018-07-27 NOTE — Telephone Encounter (Signed)
Is there any way that you can remove "possible exposure" from my notes because I never communicated that I thought I was exposed to anything . I just asked the doctor if she can test me for everything including the vitamin panel.

## 2018-07-27 NOTE — Telephone Encounter (Signed)
Patient also wants to know what it means for her Marre[place] level to be high in her urine.

## 2018-08-04 ENCOUNTER — Encounter: Payer: Self-pay | Admitting: Family Medicine

## 2018-08-06 ENCOUNTER — Encounter: Payer: Self-pay | Admitting: Family Medicine

## 2018-08-06 ENCOUNTER — Ambulatory Visit (HOSPITAL_COMMUNITY): Payer: Medicaid Other

## 2018-08-06 ENCOUNTER — Telehealth: Payer: Self-pay

## 2018-08-06 LAB — NUSWAB VAGINITIS PLUS (VG+)
Candida albicans, NAA: NEGATIVE
Candida glabrata, NAA: NEGATIVE
Chlamydia trachomatis, NAA: NEGATIVE
Neisseria gonorrhoeae, NAA: NEGATIVE
Trich vag by NAA: NEGATIVE

## 2018-08-06 NOTE — Telephone Encounter (Signed)
Patient would like a call regarding her Nuswab results.

## 2018-08-18 ENCOUNTER — Telehealth: Payer: Self-pay

## 2018-08-18 ENCOUNTER — Encounter: Payer: Self-pay | Admitting: Family Medicine

## 2018-08-18 NOTE — Telephone Encounter (Signed)
Patient would like a referral to [facility] for the [facility]. Patient also states that she is still having abdominal pain and vaginal discomfort. Patient is aware that you are out of office until Friday. Wanted to know if something could be sent in. Patient was advise that she may need to go to urgent care

## 2018-08-19 ENCOUNTER — Other Ambulatory Visit: Payer: Self-pay | Admitting: Family Medicine

## 2018-08-19 ENCOUNTER — Ambulatory Visit (HOSPITAL_COMMUNITY)
Admission: EM | Admit: 2018-08-19 | Discharge: 2018-08-19 | Disposition: A | Discharge status: Home / Self Care | Payer: Medicaid Other | Attending: Internal Medicine | Admitting: Internal Medicine

## 2018-08-19 ENCOUNTER — Other Ambulatory Visit: Payer: Self-pay

## 2018-08-19 DIAGNOSIS — N760 Acute vaginitis: Secondary | ICD-10-CM | POA: Diagnosis not present

## 2018-08-19 DIAGNOSIS — N949 Unspecified condition associated with female genital organs and menstrual cycle: Secondary | ICD-10-CM

## 2018-08-19 LAB — POCT URINALYSIS DIP (DEVICE)
Bilirubin Urine: NEGATIVE
Glucose, UA: NEGATIVE mg/dL
Ketones, ur: NEGATIVE mg/dL
Leukocytes,Ua: NEGATIVE
Nitrite: NEGATIVE
Protein, ur: NEGATIVE mg/dL
Specific Gravity, Urine: >=1.03 (ref 1.005–1.030)
Urobilinogen, UA: 0.2 mg/dL (ref 0.0–1.0)
pH: 5.5 (ref 5.0–8.0)

## 2018-08-19 MED ORDER — FLUCONAZOLE 200 MG PO TABS: ORAL_TABLET | ORAL | 0 refills | Status: DC

## 2018-08-19 NOTE — ED Provider Notes (Signed)
[place]    CSN: [id number] Arrival date & time: 08/19/18  1318     History   Chief Complaint No chief complaint on file.   HPI Genelle Economou is a 23 y.o. female with no past medical history comes to urgent care with complaints of intermittent lower abdominal pain of a few days duration.  Patient denies any nausea, vomiting or flank pain.  She is also noted whitish discharge which is not pruritic.  Discharge is not foul-smelling.  Patient denies any deep dyspareunia.  Patient has single sexual partner and engages in unprotected sex with him.   HPI  Past Medical History:  Diagnosis Date  . Abdominal pain   . Anxiety   . Vitamin D deficiency 06/2018    Patient Active Problem List   Diagnosis Date Noted  . Intractable headache 07/25/2018  . Right lower quadrant abdominal pain 05/21/2018  . Urinary tract infection without hematuria 05/21/2018  . Pelvic pain 03/30/2018  . Vaginal discharge 03/30/2018  . Elevated BP 03/14/2014    Past Surgical History:  Procedure Laterality Date  . lung puncture      OB History   No obstetric history on file.      Home Medications    Prior to Admission medications   Medication Sig Start Date End Date Taking? Authorizing Provider  butalbital-acetaminophen-caffeine (FIORICET) 50-325-40 MG tablet Take 1 tablet by mouth 2 (two) times daily as needed for headache. 07/23/18   Azzie Glatter, FNP  fluconazole (DIFLUCAN) 200 MG tablet Please take 1 tablet now and repeat in 72 hrs if no improvement 08/19/18   Octavis Sheeler, Myrene Galas, MD  naproxen sodium (ALEVE) 220 MG tablet Take 220 mg by mouth.    [provider]  Vitamin D, Ergocalciferol, (DRISDOL) 1.25 MG (50000 UT) CAPS capsule Take 1 capsule (50,000 Units total) by mouth every 7 (seven) days. 07/24/18   Azzie Glatter, FNP    Family History Family History  Problem Relation Age of Onset  . Mental illness Neg Hx     Social History Social History    Tobacco Use  . Smoking status: Never Smoker  . Smokeless tobacco: Never Used  Substance Use Topics  . Alcohol use: Yes    Comment: occasionally  . Drug use: Never     Allergies   Imitrex [sumatriptan]   Review of Systems Review of Systems  Constitutional: Negative.   Respiratory: Negative.   Cardiovascular: Negative.   Gastrointestinal: Positive for abdominal pain.  Genitourinary: Positive for dysuria and vaginal discharge. Negative for dyspareunia, frequency, pelvic pain, urgency, vaginal bleeding and vaginal pain.  Musculoskeletal: Negative for arthralgias, gait problem and joint swelling.  Skin: Negative.   Neurological: Negative for dizziness, weakness and light-headedness.     Physical Exam Triage Vital Signs ED Triage Vitals [08/19/18 1336]  Enc Vitals Group     BP 131/75     Pulse Rate 60     Resp 16     Temp 98.1 F (36.7 C)     Temp Source Oral     SpO2 100 %     Weight      Height      Head Circumference      Peak Flow      Pain Score      Pain Loc      Pain Edu?      Excl. in [place]?    No data found.  Updated Vital Signs BP 131/75 (BP Location: Left  Arm)   Pulse 60   Temp 98.1 F (36.7 C) (Oral)   Resp 16   SpO2 100%   Visual Acuity Right Eye Distance:   Left Eye Distance:   Bilateral Distance:    Right Eye Near:   Left Eye Near:    Bilateral Near:     Physical Exam Constitutional:      Appearance: She is not ill-appearing or toxic-appearing.  Cardiovascular:     Rate and Rhythm: Normal rate and regular rhythm.  Pulmonary:     Effort: Pulmonary effort is normal.     Breath sounds: Normal breath sounds.  Abdominal:     General: Bowel sounds are normal.     Palpations: Abdomen is soft.  Musculoskeletal: Normal range of motion.  Skin:    General: Skin is warm.     Capillary Refill: Capillary refill takes less than 2 seconds.     Coloration: Skin is not jaundiced.     Findings: No bruising.  Neurological:     General: No focal  deficit present.     Mental Status: She is alert and oriented to person, place, and time.      UC Treatments / Results  Labs (all labs ordered are listed, but only abnormal results are displayed) Labs Reviewed  URINE CYTOLOGY ANCILLARY ONLY  CERVICOVAGINAL ANCILLARY ONLY    EKG   Radiology No results found.  Procedures Procedures (including critical care time)  Medications Ordered in UC Medications - No data to display  Initial Impression / Assessment and Plan / UC Course  I have reviewed the triage vital signs and the nursing notes.  Pertinent labs & imaging results that were available during my care of the patient were reviewed by me and considered in my medical decision making (see chart for details).     1.  Vaginal discharge with lower abdominal pain: Point-of-care urinalysis Urine for GC/chlamydia/trichomonas Fluconazole 200 mg x 1 to be repeated in 72 hours if no improvement. If patient's symptoms worsen she is advised to return to urgent care to be reevaluated. Final Clinical Impressions(s) / UC Diagnoses   Final diagnoses:  None   Discharge Instructions   None    ED Prescriptions    Medication Sig Dispense Auth. Provider   fluconazole (DIFLUCAN) 200 MG tablet Please take 1 tablet now and repeat in 72 hrs if no improvement 2 tablet Chantele Corado, Britta MccreedyPhilip O, MD     Controlled Substance Prescriptions [place] Controlled Substance Registry consulted? No   Merrilee JanskyLamptey, Leatta Alewine O, MD 08/19/18 1447

## 2018-08-19 NOTE — Telephone Encounter (Signed)
Are you going to put in the referral

## 2018-08-20 ENCOUNTER — Ambulatory Visit: Payer: Self-pay | Admitting: Family Medicine

## 2018-08-20 NOTE — Telephone Encounter (Signed)
Patient notified

## 2018-08-21 LAB — URINE CYTOLOGY ANCILLARY ONLY
Chlamydia: NEGATIVE
Neisseria Gonorrhea: NEGATIVE
Trichomonas: NEGATIVE

## 2018-08-21 LAB — CERVICOVAGINAL ANCILLARY ONLY
Bacterial vaginitis: NEGATIVE
Candida vaginitis: NEGATIVE
Chlamydia: POSITIVE — AB
Neisseria Gonorrhea: NEGATIVE
Trichomonas: NEGATIVE

## 2018-08-22 ENCOUNTER — Encounter: Payer: Self-pay | Admitting: Family Medicine

## 2018-08-22 ENCOUNTER — Telehealth (HOSPITAL_COMMUNITY): Payer: Self-pay | Admitting: Emergency Medicine

## 2018-08-22 MED ORDER — AZITHROMYCIN 250 MG PO TABS: 1000.0000 mg | ORAL_TABLET | Freq: Once | ORAL | 0 refills | Status: DC

## 2018-08-22 MED ORDER — AZITHROMYCIN 250 MG PO TABS: 1000.0000 mg | ORAL_TABLET | Freq: Once | ORAL | 0 refills | Status: AC

## 2018-08-22 NOTE — Telephone Encounter (Signed)
Chlamydia is positive.  Rx po zithromax 1g #1 dose no refills was sent to the pharmacy of record.  Pt needs education to please refrain from sexual intercourse for 7 days to give the medicine time to work, sexual partners need to be notified and tested/treated.  Condoms may reduce risk of reinfection.  Recheck or followup with PCP for further evaluation if symptoms are not improving.   [facility] notified.  Patient contacted and made aware of all results, all questions answered.    

## 2018-08-23 LAB — URINE CYTOLOGY ANCILLARY ONLY
Bacterial vaginitis: NEGATIVE
Candida vaginitis: NEGATIVE

## 2018-08-29 ENCOUNTER — Encounter: Payer: Self-pay | Admitting: Family Medicine

## 2018-09-04 ENCOUNTER — Other Ambulatory Visit: Payer: Self-pay

## 2018-09-04 DIAGNOSIS — Z20822 Contact with and (suspected) exposure to covid-19: Secondary | ICD-10-CM

## 2018-09-04 DIAGNOSIS — R6889 Other general symptoms and signs: Secondary | ICD-10-CM | POA: Diagnosis not present

## 2018-09-05 LAB — NOVEL CORONAVIRUS, NAA: SARS-CoV-2, NAA: NOT DETECTED

## 2018-09-08 ENCOUNTER — Telehealth: Payer: Self-pay

## 2018-09-09 NOTE — Telephone Encounter (Signed)
Patient given information yesterday.

## 2018-09-13 DIAGNOSIS — K5909 Other constipation: Secondary | ICD-10-CM | POA: Diagnosis not present

## 2018-09-13 DIAGNOSIS — R1030 Lower abdominal pain, unspecified: Secondary | ICD-10-CM | POA: Diagnosis not present

## 2018-09-13 DIAGNOSIS — Z01419 Encounter for gynecological examination (general) (routine) without abnormal findings: Secondary | ICD-10-CM | POA: Diagnosis not present

## 2018-09-13 DIAGNOSIS — Z113 Encounter for screening for infections with a predominantly sexual mode of transmission: Secondary | ICD-10-CM | POA: Diagnosis not present

## 2018-10-05 ENCOUNTER — Ambulatory Visit (INDEPENDENT_AMBULATORY_CARE_PROVIDER_SITE_OTHER): Payer: Medicaid Other | Admitting: Family Medicine

## 2018-10-05 ENCOUNTER — Other Ambulatory Visit: Payer: Self-pay

## 2018-10-05 VITALS — Temp 98.1°F

## 2018-10-05 DIAGNOSIS — Z23 Encounter for immunization: Secondary | ICD-10-CM | POA: Diagnosis not present

## 2018-10-06 NOTE — Progress Notes (Signed)
Pt came in for a flu shot

## 2018-11-17 DIAGNOSIS — R102 Pelvic and perineal pain: Secondary | ICD-10-CM | POA: Diagnosis not present

## 2018-11-17 DIAGNOSIS — R399 Unspecified symptoms and signs involving the genitourinary system: Secondary | ICD-10-CM | POA: Diagnosis not present

## 2018-11-17 DIAGNOSIS — Z113 Encounter for screening for infections with a predominantly sexual mode of transmission: Secondary | ICD-10-CM | POA: Diagnosis not present

## 2018-11-18 DIAGNOSIS — H5213 Myopia, bilateral: Secondary | ICD-10-CM | POA: Diagnosis not present

## 2018-12-01 DIAGNOSIS — Z113 Encounter for screening for infections with a predominantly sexual mode of transmission: Secondary | ICD-10-CM | POA: Diagnosis not present

## 2018-12-01 DIAGNOSIS — R102 Pelvic and perineal pain: Secondary | ICD-10-CM | POA: Diagnosis not present

## 2019-01-08 ENCOUNTER — Other Ambulatory Visit: Payer: Self-pay

## 2019-01-08 DIAGNOSIS — Z20822 Contact with and (suspected) exposure to covid-19: Secondary | ICD-10-CM

## 2019-01-10 ENCOUNTER — Other Ambulatory Visit: Payer: Self-pay

## 2019-01-10 ENCOUNTER — Emergency Department (HOSPITAL_COMMUNITY)
Admission: EM | Admit: 2019-01-10 | Discharge: 2019-01-11 | Disposition: A | Discharge status: Home / Self Care | Payer: Medicaid Other | Attending: Emergency Medicine | Admitting: Emergency Medicine

## 2019-01-10 ENCOUNTER — Encounter (HOSPITAL_COMMUNITY): Payer: Self-pay | Admitting: Emergency Medicine

## 2019-01-10 ENCOUNTER — Ambulatory Visit (HOSPITAL_COMMUNITY)
Admission: EM | Admit: 2019-01-10 | Discharge: 2019-01-10 | Disposition: A | Discharge status: Home / Self Care | Payer: Medicaid Other

## 2019-01-10 ENCOUNTER — Telehealth: Payer: Self-pay | Admitting: Family Medicine

## 2019-01-10 DIAGNOSIS — R002 Palpitations: Secondary | ICD-10-CM | POA: Insufficient documentation

## 2019-01-10 DIAGNOSIS — Z79899 Other long term (current) drug therapy: Secondary | ICD-10-CM | POA: Insufficient documentation

## 2019-01-10 LAB — NOVEL CORONAVIRUS, NAA: SARS-CoV-2, NAA: NOT DETECTED

## 2019-01-10 NOTE — ED Notes (Signed)
[name], patient access reports patient left stating she was going to ED-she said she needed to be seen now

## 2019-01-10 NOTE — ED Notes (Signed)
Patient reports feeling heart beating fast, patient has had several cups of coffee

## 2019-01-10 NOTE — ED Triage Notes (Addendum)
Pt states at 1pm she began to feel a fast heart rate and felt "werid" Pt went to UC but due to wait came to ER. Pt reports a mild discomfort in center of chest. Pt feels jittery- pt reports drinking 3 cups of coffee with is not normal for her, also on her way to work her car broke down so thinks this could be contributing to anxiety.

## 2019-01-11 ENCOUNTER — Other Ambulatory Visit: Payer: Self-pay

## 2019-01-11 ENCOUNTER — Emergency Department (HOSPITAL_COMMUNITY): Payer: Medicaid Other

## 2019-01-11 DIAGNOSIS — R002 Palpitations: Secondary | ICD-10-CM | POA: Diagnosis not present

## 2019-01-11 NOTE — ED Notes (Signed)
Pt back from x-ray.

## 2019-01-11 NOTE — Discharge Instructions (Addendum)
Please abstain from drinking caffeinated drinks as this may have been the cause of your palpitations today. Return to the ED if you develop chest pain, worsening palpitations, shortness of breath, leg swelling, vomiting or coughing up blood.

## 2019-01-11 NOTE — ED Notes (Signed)
Pt discharge instructions and handouts reviewed with the patient. The patient verbalized understanding of both. Pt discharged.

## 2019-01-11 NOTE — ED Notes (Signed)
Patient transported to X-ray 

## 2019-01-11 NOTE — ED Provider Notes (Signed)
[facility] EMERGENCY DEPARTMENT Provider Note   CSN: [id number] Arrival date & time: 01/10/19  1727     History Chief Complaint  Patient presents with  . Palpitations    Regina Warren is a 23 y.o. female who presents to ED with a chief complaint of palpitations.  Approximately 19 hours ago patient drinks 3 cups of coffee prior to going to work.  2 hours later she developed feelings of "jitteriness" and feeling "weird" as well as palpitations.  She checked her blood pressure at home when symptoms began and found that it was "150/something."  Since arrival to the ED her symptoms have gradually improved.  She states that she usually does not drink coffee.  She is concerned that this could have been attributed to her jitteriness.  She denies any chest pain, shortness of breath, leg swelling, OCP use, cough, fever, sick contacts with similar symptoms.  HPI     Past Medical History:  Diagnosis Date  . Abdominal pain   . Anxiety   . Vitamin D deficiency 06/2018    Patient Active Problem List   Diagnosis Date Noted  . Intractable headache 07/25/2018  . Right lower quadrant abdominal pain 05/21/2018  . Urinary tract infection without hematuria 05/21/2018  . Pelvic pain 03/30/2018  . Vaginal discharge 03/30/2018  . Elevated BP 03/14/2014    Past Surgical History:  Procedure Laterality Date  . lung puncture       OB History   No obstetric history on file.     Family History  Problem Relation Age of Onset  . Mental illness Neg Hx     Social History   Tobacco Use  . Smoking status: Never Smoker  . Smokeless tobacco: Never Used  Substance Use Topics  . Alcohol use: Yes    Comment: occasionally  . Drug use: Never    Home Medications Prior to Admission medications   Medication Sig Start Date End Date Taking? Authorizing Provider  Multiple Vitamins-Minerals (AIRBORNE GUMMIES PO) Take 1 tablet by mouth 2 (two) times daily.   Yes [provider]  naproxen sodium (ALEVE) 220 MG tablet Take 220 mg by mouth.   Yes [provider]  Vitamin D, Ergocalciferol, (DRISDOL) 1.25 MG (50000 UT) CAPS capsule Take 1 capsule (50,000 Units total) by mouth every 7 (seven) days. 07/24/18  Yes Kallie Locks, FNP  butalbital-acetaminophen-caffeine (FIORICET) [phone] MG tablet Take 1 tablet by mouth 2 (two) times daily as needed for headache. Patient not taking: Reported on 01/11/2019 07/23/18   Kallie Locks, FNP  fluconazole (DIFLUCAN) 200 MG tablet Please take 1 tablet now and repeat in 72 hrs if no improvement Patient not taking: Reported on 01/11/2019 08/19/18   Merrilee Jansky, MD    Allergies    Imitrex [sumatriptan]  Review of Systems   Review of Systems  Constitutional: Negative for appetite change, chills and fever.  HENT: Negative for ear pain, rhinorrhea, sneezing and sore throat.   Eyes: Negative for photophobia and visual disturbance.  Respiratory: Negative for cough, chest tightness, shortness of breath and wheezing.   Cardiovascular: Positive for palpitations. Negative for chest pain.  Gastrointestinal: Negative for abdominal pain, blood in stool, constipation, diarrhea, nausea and vomiting.  Genitourinary: Negative for dysuria, hematuria and urgency.  Musculoskeletal: Negative for myalgias.  Skin: Negative for rash.  Neurological: Negative for dizziness, weakness and light-headedness.    Physical Exam Updated Vital Signs BP 122/81   Pulse 60   Temp  98.2 F (36.8 C) (Oral)   Resp 14   SpO2 100%   Physical Exam Vitals and nursing note reviewed.  Constitutional:      General: She is not in acute distress.    Appearance: She is well-developed.  HENT:     Head: Normocephalic and atraumatic.     Nose: Nose normal.  Eyes:     General: No scleral icterus.       Left eye: No discharge.     Conjunctiva/sclera: Conjunctivae normal.  Cardiovascular:     Rate and Rhythm: Normal rate and regular  rhythm.     Heart sounds: Normal heart sounds. No murmur. No friction rub. No gallop.   Pulmonary:     Effort: Pulmonary effort is normal. No respiratory distress.     Breath sounds: Normal breath sounds.  Abdominal:     General: Bowel sounds are normal. There is no distension.     Palpations: Abdomen is soft.     Tenderness: There is no abdominal tenderness. There is no guarding.  Musculoskeletal:        General: Normal range of motion.     Cervical back: Normal range of motion and neck supple.     Comments: No lower extremity edema, erythema or calf tenderness bilaterally.  Skin:    General: Skin is warm and dry.     Findings: No rash.  Neurological:     Mental Status: She is alert.     Motor: No abnormal muscle tone.     Coordination: Coordination normal.     ED Results / Procedures / Treatments   Labs (all labs ordered are listed, but only abnormal results are displayed) Labs Reviewed - No data to display  EKG EKG Interpretation  Date/Time:  Monday January 10 2019 18:00:12 EST Ventricular Rate:  62 PR Interval:  144 QRS Duration: 82 QT Interval:  400 QTC Calculation: 406 R Axis:   50 Text Interpretation: Normal sinus rhythm Normal ECG Confirmed by Donnetta Hutchingook, Brian ([id number]) on 01/11/2019 8:17:54 AM   Radiology DG Chest 2 View  Result Date: 01/11/2019 CLINICAL DATA:  Cardiac palpitations EXAM: CHEST - 2 VIEW COMPARISON:  October 07, 2017 FINDINGS: Lungs are clear. Heart size and pulmonary vascular normal. No adenopathy. No pneumothorax. Mild pectus deformity noted. No evident fracture. IMPRESSION: No edema or consolidation.  Stable cardiac silhouette. Electronically Signed   By: Bretta BangWilliam  Woodruff III M.D.   On: 01/11/2019 09:42    Procedures Procedures (including critical care time)  Medications Ordered in ED Medications - No data to display  ED Course  I have reviewed the triage vital signs and the nursing notes.  Pertinent labs & imaging results that were  available during my care of the patient were reviewed by me and considered in my medical decision making (see chart for details).    MDM Rules/Calculators/A&P                      23 year old female presents to ED with a chief complaint of palpitations.  Symptoms began yesterday after she drank 3 cups of coffee.  Unfortunately she did have a long wait in the waiting room and her symptoms have gradually improved since arrival to the ED.  She denies any chest pain or shortness of breath.  She is overall well-appearing on my examination.  Her vital signs are within normal limits.  No lower extremity edema, erythema or calf tenderness of concern me for DVT.  EKG shows normal  sinus rhythm without ischemic changes. CXR without acute findings.  Suspect that her symptoms are due to her recent caffeine intake.  Doubt ACS, PE or other surgical emergent cause of her palpitations at her symptoms have gradually improved and vital signs remained stable here.  We will have her refrain from drinking caffeinated beverages and have her follow-up with PCP.  Patient is hemodynamically stable, in NAD, and able to ambulate in the ED. Evaluation does not show pathology that would require ongoing emergent intervention or inpatient treatment. I explained the diagnosis to the patient. Pain has been managed and has no complaints prior to discharge. Patient is comfortable with above plan and is stable for discharge at this time. All questions were answered prior to disposition. Strict return precautions for returning to the ED were discussed. Encouraged follow up with PCP.   An After Visit Summary was printed and given to the patient.   Portions of this note were generated with Lobbyist. Dictation errors may occur despite best attempts at proofreading.   Final Clinical Impression(s) / ED Diagnoses Final diagnoses:  Palpitations    Rx / DC Orders ED Discharge Orders    None       Delia Heady, PA-C  01/11/19 0957    Nat Christen, MD 01/12/19 1239

## 2019-01-19 NOTE — Telephone Encounter (Signed)
done

## 2019-01-24 ENCOUNTER — Ambulatory Visit: Payer: Self-pay | Admitting: Family Medicine

## 2019-02-01 ENCOUNTER — Ambulatory Visit (INDEPENDENT_AMBULATORY_CARE_PROVIDER_SITE_OTHER): Payer: Medicaid Other | Admitting: Family Medicine

## 2019-02-01 ENCOUNTER — Encounter: Payer: Self-pay | Admitting: Family Medicine

## 2019-02-01 ENCOUNTER — Other Ambulatory Visit: Payer: Self-pay

## 2019-02-01 VITALS — BP 127/82 | HR 81 | Temp 98.2°F | Ht 65.0 in | Wt 164.8 lb

## 2019-02-01 DIAGNOSIS — I10 Essential (primary) hypertension: Secondary | ICD-10-CM

## 2019-02-01 DIAGNOSIS — Z202 Contact with and (suspected) exposure to infections with a predominantly sexual mode of transmission: Secondary | ICD-10-CM | POA: Diagnosis not present

## 2019-02-01 DIAGNOSIS — Z7251 High risk heterosexual behavior: Secondary | ICD-10-CM | POA: Diagnosis not present

## 2019-02-01 DIAGNOSIS — Z711 Person with feared health complaint in whom no diagnosis is made: Secondary | ICD-10-CM | POA: Diagnosis not present

## 2019-02-01 DIAGNOSIS — N926 Irregular menstruation, unspecified: Secondary | ICD-10-CM

## 2019-02-01 DIAGNOSIS — E559 Vitamin D deficiency, unspecified: Secondary | ICD-10-CM

## 2019-02-01 DIAGNOSIS — Z09 Encounter for follow-up examination after completed treatment for conditions other than malignant neoplasm: Secondary | ICD-10-CM

## 2019-02-01 LAB — POCT URINALYSIS DIPSTICK
Bilirubin, UA: NEGATIVE
Blood, UA: NEGATIVE
Glucose, UA: NEGATIVE
Ketones, UA: NEGATIVE
Leukocytes, UA: NEGATIVE
Nitrite, UA: NEGATIVE
Protein, UA: NEGATIVE
Spec Grav, UA: 1.025 (ref 1.010–1.025)
Urobilinogen, UA: 0.2 E.U./dL
pH, UA: 7 (ref 5.0–8.0)

## 2019-02-01 LAB — POCT URINE PREGNANCY: Preg Test, Ur: NEGATIVE

## 2019-02-01 MED ORDER — VITAMIN D (ERGOCALCIFEROL) 1.25 MG (50000 UNIT) PO CAPS: 50000.0000 [IU] | ORAL_CAPSULE | ORAL | 6 refills | Status: DC

## 2019-02-01 NOTE — Progress Notes (Signed)
[facility]  Established Patient Office Visit  Subjective:  Patient ID: Regina Warren, female    DOB: 03/10/1995  Age: 24 y.o. MRN: [id number]  CC:  Chief Complaint  Patient presents with  . Hospitalization Follow-up    ED 01/10/2019 Palpitations  . Menstrual Problem    Late  . STD Check  . Hernia    hernia check    HPI Regina Warren is a 24 year old female who presents for Follow Up today.   Past Medical History:  Diagnosis Date  . Abdominal pain   . Anxiety   . Vitamin D deficiency 06/2018   Current Status: Since her last office visit, she is doing well with no complaints. She is inquiring for STD testing today, because of a new partner. She is asymptomatic. She denies fevers, chills, fatigue, recent infections, weight loss, and night sweats. She has not had any headaches, visual changes, dizziness, and falls. No chest pain, heart palpitations, cough and shortness of breath reported. No reports of GI problems such as nausea, vomiting, diarrhea, and constipation. She has no reports of blood in stools, dysuria and hematuria. No depression or anxiety, and denies suicidal ideations, homicidal ideations, or auditory hallucinations. She denies pain today.   Past Surgical History:  Procedure Laterality Date  . lung puncture      Family History  Problem Relation Age of Onset  . Mental illness Neg Hx     Social History   Socioeconomic History  . Marital status: Single    Spouse name: Not on file  . Number of children: Not on file  . Years of education: Not on file  . Highest education level: Not on file  Occupational History  . Not on file  Tobacco Use  . Smoking status: Never Smoker  . Smokeless tobacco: Never Used  Substance and Sexual Activity  . Alcohol use: Yes    Comment: occasionally  . Drug use: Never  . Sexual activity: Yes  Other Topics Concern  . Not on file  Social History Narrative    ** Merged History Encounter **       Social Determinants of Health   Financial Resource Strain:   . Difficulty of Paying Living Expenses: Not on file  Food Insecurity:   . Worried About Programme researcher, broadcasting/film/video in the Last Year: Not on file  . Ran Out of Food in the Last Year: Not on file  Transportation Needs:   . Lack of Transportation (Medical): Not on file  . Lack of Transportation (Non-Medical): Not on file  Physical Activity:   . Days of Exercise per Week: Not on file  . Minutes of Exercise per Session: Not on file  Stress:   . Feeling of Stress : Not on file  Social Connections:   . Frequency of Communication with Friends and Family: Not on file  . Frequency of Social Gatherings with Friends and Family: Not on file  . Attends Religious Services: Not on file  . Active Member of Clubs or Organizations: Not on file  . Attends Banker Meetings: Not on file  . Marital Status: Not on file  Intimate Partner Violence:   . Fear of Current or Ex-Partner: Not on file  . Emotionally Abused: Not on file  . Physically Abused: Not on file  . Sexually Abused: Not on file    Outpatient Medications Prior to Visit  Medication Sig Dispense Refill  .  butalbital-acetaminophen-caffeine (FIORICET) 50-325-40 MG tablet Take 1 tablet by mouth 2 (two) times daily as needed for headache. (Patient not taking: Reported on 01/11/2019) 14 tablet 0  . fluconazole (DIFLUCAN) 200 MG tablet Please take 1 tablet now and repeat in 72 hrs if no improvement (Patient not taking: Reported on 01/11/2019) 2 tablet 0  . Multiple Vitamins-Minerals (AIRBORNE GUMMIES PO) Take 1 tablet by mouth 2 (two) times daily.    . naproxen sodium (ALEVE) 220 MG tablet Take 220 mg by mouth.    . Vitamin D, Ergocalciferol, (DRISDOL) 1.25 MG (50000 UT) CAPS capsule Take 1 capsule (50,000 Units total) by mouth every 7 (seven) days. 5 capsule 3   No facility-administered medications prior to visit.    Allergies    Allergen Reactions  . Imitrex [Sumatriptan]     "made my brain feel like it was about to explode"    ROS Review of Systems  Constitutional: Negative.   HENT: Negative.   Eyes: Negative.   Respiratory: Negative.   Cardiovascular: Negative.   Gastrointestinal: Negative.   Endocrine: Negative.   Genitourinary: Negative.   Musculoskeletal: Negative.   Skin: Negative.   Allergic/Immunologic: Negative.   Neurological: Negative.   Hematological: Negative.   Psychiatric/Behavioral: Negative.       Objective:    Physical Exam  Constitutional: She is oriented to person, place, and time. She appears well-developed and well-nourished.  HENT:  Head: Normocephalic and atraumatic.  Eyes: Conjunctivae are normal.  Cardiovascular: Normal rate, regular rhythm, normal heart sounds and intact distal pulses.  Pulmonary/Chest: Breath sounds normal.  Abdominal: Soft.  Musculoskeletal:        General: Normal range of motion.     Cervical back: Normal range of motion and neck supple.  Neurological: She is alert and oriented to person, place, and time. She has normal reflexes.  Skin: Skin is warm and dry.  Psychiatric: She has a normal mood and affect. Her behavior is normal. Judgment and thought content normal.  Nursing note and vitals reviewed.   BP 127/82   Pulse 81   Temp 98.2 F (36.8 C) (Oral)   Ht 5\' 5"  (1.651 m)   Wt 164 lb 12.8 oz (74.8 kg)   LMP 01/02/2019   SpO2 98%   BMI 27.42 kg/m  Wt Readings from Last 3 Encounters:  02/01/19 164 lb 12.8 oz (74.8 kg)  07/23/18 160 lb 3.2 oz (72.7 kg)  05/21/18 162 lb (73.5 kg)     There are no preventive care reminders to display for this patient.  There are no preventive care reminders to display for this patient.  Lab Results  Component Value Date   TSH 2.39 05/07/2016   Lab Results  Component Value Date   WBC 6.2 10/07/2017   HGB 12.7 10/07/2017   HCT 38.1 10/07/2017   MCV 96.2 10/07/2017   PLT 265 10/07/2017   Lab  Results  Component Value Date   NA 138 10/07/2017   K 3.5 10/07/2017   CO2 23 10/07/2017   GLUCOSE 89 10/07/2017   BUN 8 10/07/2017   CREATININE 0.77 10/07/2017   BILITOT 0.6 08/12/2017   ALKPHOS 64 08/12/2017   AST 20 08/12/2017   ALT 20 08/12/2017   PROT 7.5 08/12/2017   ALBUMIN 4.6 08/12/2017   CALCIUM 8.8 (L) 10/07/2017   ANIONGAP 9 10/07/2017   No results found for: CHOL No results found for: HDL No results found for: LDLCALC No results found for: TRIG No results found for: CHOLHDL  No results found for: HGBA1C  Assessment & Plan:   1. Hypertension, unspecified type The current medical regimen is effective; blood pressure is stable at 127/82 today; continue present plan and medications as prescribed. She will continue to take medications as prescribed, to decrease high sodium intake, excessive alcohol intake, increase potassium intake, smoking cessation, and increase physical activity of at least 30 minutes of cardio activity daily. She will continue to follow Heart Healthy or DASH diet. - Urinalysis Dipstick  2. Concern about STD in female without diagnosis - Urinalysis Dipstick - NuSwab Vaginitis Plus (VG+)  3. Late period  4. Unprotected sex - POCT urine pregnancy  5. Vitamin D deficiency - Vitamin D, Ergocalciferol, (DRISDOL) 1.25 MG (50000 UT) CAPS capsule; Take 1 capsule (50,000 Units total) by mouth every 7 (seven) days.  Dispense: 5 capsule; Refill: 6  6. Possible exposure to STD - STD Screen (8); Future  7. Follow up She will follow up as needed.   Meds ordered this encounter  Medications  . Vitamin D, Ergocalciferol, (DRISDOL) 1.25 MG (50000 UT) CAPS capsule    Sig: Take 1 capsule (50,000 Units total) by mouth every 7 (seven) days.    Dispense:  5 capsule    Refill:  6    Orders Placed This Encounter  Procedures  . NuSwab Vaginitis Plus (VG+)  . STD Screen (8)  . Urinalysis Dipstick  . POCT urine pregnancy    Referral Orders  No  referral(s) requested today    Raliegh Ip,  MSN, FNP-BC [facility] [address] [place], [place] [address] [phone] fax  Problem List Items Addressed This Visit    None    Visit Diagnoses    Hypertension, unspecified type    -  Primary   Relevant Orders   Urinalysis Dipstick (Completed)   Concern about STD in female without diagnosis       Relevant Orders   Urinalysis Dipstick (Completed)   NuSwab Vaginitis Plus (VG+)   Late period       Unprotected sex       Relevant Orders   POCT urine pregnancy (Completed)   Vitamin D deficiency       Relevant Medications   Vitamin D, Ergocalciferol, (DRISDOL) 1.25 MG (50000 UT) CAPS capsule   Possible exposure to STD       Relevant Orders   STD Screen (8)   Follow up          Meds ordered this encounter  Medications  . Vitamin D, Ergocalciferol, (DRISDOL) 1.25 MG (50000 UT) CAPS capsule    Sig: Take 1 capsule (50,000 Units total) by mouth every 7 (seven) days.    Dispense:  5 capsule    Refill:  6    Follow-up: No follow-ups on file.    Kallie Locks, FNP

## 2019-02-02 ENCOUNTER — Other Ambulatory Visit: Payer: Medicaid Other

## 2019-02-02 DIAGNOSIS — Z202 Contact with and (suspected) exposure to infections with a predominantly sexual mode of transmission: Secondary | ICD-10-CM

## 2019-02-03 LAB — STD SCREEN (8)
HIV Screen 4th Generation wRfx: NONREACTIVE
HSV 1 Glycoprotein G Ab, IgG: <0.91 index (ref 0.00–0.90)
HSV 2 IgG, Type Spec: <0.91 index (ref 0.00–0.90)
Hep A IgM: NEGATIVE
Hep B C IgM: NEGATIVE
Hep C Virus Ab: 0.1 s/co ratio (ref 0.0–0.9)
Hepatitis B Surface Ag: NEGATIVE
RPR Ser Ql: NONREACTIVE

## 2019-02-04 LAB — NUSWAB VAGINITIS PLUS (VG+)
Candida albicans, NAA: NEGATIVE
Candida glabrata, NAA: NEGATIVE
Chlamydia trachomatis, NAA: NEGATIVE
Neisseria gonorrhoeae, NAA: NEGATIVE
Trich vag by NAA: NEGATIVE

## 2019-02-07 ENCOUNTER — Telehealth: Payer: Self-pay

## 2019-02-07 NOTE — Telephone Encounter (Signed)
-----   Message from Kallie Locks, FNP sent at 02/06/2019  1:48 PM EST ----- All lab results are negative. Please inform patient. Thank you.

## 2019-02-07 NOTE — Telephone Encounter (Signed)
Patient aware.

## 2019-02-07 NOTE — Telephone Encounter (Signed)
Message left for patient to cal call back for test results.

## 2019-02-28 ENCOUNTER — Ambulatory Visit
Admission: EM | Admit: 2019-02-28 | Discharge: 2019-02-28 | Disposition: A | Discharge status: Home / Self Care | Payer: Medicaid Other | Attending: Physician Assistant | Admitting: Physician Assistant

## 2019-02-28 ENCOUNTER — Ambulatory Visit: Payer: Medicaid Other | Attending: Internal Medicine

## 2019-02-28 ENCOUNTER — Telehealth: Payer: Medicaid Other

## 2019-02-28 ENCOUNTER — Other Ambulatory Visit: Payer: Self-pay

## 2019-02-28 DIAGNOSIS — R399 Unspecified symptoms and signs involving the genitourinary system: Secondary | ICD-10-CM | POA: Insufficient documentation

## 2019-02-28 DIAGNOSIS — Z20822 Contact with and (suspected) exposure to covid-19: Secondary | ICD-10-CM | POA: Diagnosis not present

## 2019-02-28 LAB — POCT URINALYSIS DIP (MANUAL ENTRY)
Bilirubin, UA: NEGATIVE
Glucose, UA: NEGATIVE mg/dL
Ketones, POC UA: NEGATIVE mg/dL
Leukocytes, UA: NEGATIVE
Nitrite, UA: NEGATIVE
Protein Ur, POC: NEGATIVE mg/dL
Spec Grav, UA: 1.025 (ref 1.010–1.025)
Urobilinogen, UA: 0.2 E.U./dL
pH, UA: 6 (ref 5.0–8.0)

## 2019-02-28 NOTE — Discharge Instructions (Addendum)
Your urine was negative for infection. You did have a little blood in the urine, which means you could have a small kidney stone causing symptoms. At this time, make sure you stay hydrated, urine should be clear to pale yellow in color. Cytology sent, you will be contacted with any positive results that requires further treatment. Refrain from sexual activity and alcohol use for the next 7 days. Monitor for any worsening of symptoms, fever, abdominal pain, nausea, vomiting, to follow up for reevaluation.

## 2019-02-28 NOTE — ED Triage Notes (Signed)
Pt states having urinary frequency and can't empty her bladder since yesterday. States has had un protective intercourse prior to these sx's.States has had congestion and headache x7 days at night only. States was COVID tested this morning.

## 2019-02-28 NOTE — ED Provider Notes (Signed)
[name]    CSN: [id number] Arrival date & time: 02/28/19  1122      History   Chief Complaint Chief Complaint  Patient presents with  . Urinary Tract Infection    HPI Regina Warren is a 24 y.o. female.   24 year old female comes in for 2 day history of urinary symptoms. Has had urinary frequency, trouble emptying bladder. Denies dysuria, hematuria. Denies abdominal pain, nausea, vomiting. Denies fever, chills, flank/back pain. Denies vaginal discharge, itching, spotting. LMP 1/6/20201. Sexually active 1 female partner. States had unprotected intercourse prior to symptom onset.      Past Medical History:  Diagnosis Date  . Abdominal pain   . Anxiety   . Vitamin D deficiency 06/2018    Patient Active Problem List   Diagnosis Date Noted  . Intractable headache 07/25/2018  . Right lower quadrant abdominal pain 05/21/2018  . Urinary tract infection without hematuria 05/21/2018  . Pelvic pain 03/30/2018  . Vaginal discharge 03/30/2018  . Elevated BP 03/14/2014    Past Surgical History:  Procedure Laterality Date  . lung puncture      OB History    Gravida  1   Para      Term      Preterm      AB      Living  1     SAB      TAB      Ectopic      Multiple      Live Births               Home Medications    Prior to Admission medications   Medication Sig Start Date End Date Taking? Authorizing Provider  naproxen sodium (ALEVE) 220 MG tablet Take 220 mg by mouth.    [provider]  Vitamin D, Ergocalciferol, (DRISDOL) 1.25 MG (50000 UT) CAPS capsule Take 1 capsule (50,000 Units total) by mouth every 7 (seven) days. 02/01/19   Kallie Locks, FNP    Family History Family History  Problem Relation Age of Onset  . Mental illness Neg Hx     Social History Social History   Tobacco Use  . Smoking status: Never Smoker  . Smokeless tobacco: Never Used  Substance Use Topics  . Alcohol use: Yes    Comment:  occasionally  . Drug use: Never     Allergies   Imitrex [sumatriptan]   Review of Systems Review of Systems  Reason unable to perform ROS: See HPI as above.     Physical Exam Triage Vital Signs ED Triage Vitals  Enc Vitals Group     BP 02/28/19 1143 134/88     Pulse Rate 02/28/19 1143 (!) 103     Resp 02/28/19 1143 16     Temp 02/28/19 1143 98.6 F (37 C)     Temp Source 02/28/19 1143 Oral     SpO2 02/28/19 1144 96 %     Weight --      Height --      Head Circumference --      Peak Flow --      Pain Score 02/28/19 1144 0     Pain Loc --      Pain Edu? --      Excl. in GC? --    No data found.  Updated Vital Signs BP 134/88 (BP Location: Left Arm)   Pulse (!) 103   Temp 98.6 F (37 C) (Oral)  Resp 16   LMP 02/02/2019   SpO2 96%   Physical Exam Constitutional:      General: She is not in acute distress.    Appearance: She is well-developed. She is not ill-appearing, toxic-appearing or diaphoretic.  HENT:     Head: Normocephalic and atraumatic.  Eyes:     Conjunctiva/sclera: Conjunctivae normal.     Pupils: Pupils are equal, round, and reactive to light.  Cardiovascular:     Rate and Rhythm: Normal rate and regular rhythm.  Pulmonary:     Effort: Pulmonary effort is normal. No respiratory distress.     Comments: LCTAB Abdominal:     General: Bowel sounds are normal.     Palpations: Abdomen is soft.     Tenderness: There is no abdominal tenderness. There is no right CVA tenderness, left CVA tenderness, guarding or rebound.  Musculoskeletal:     Cervical back: Normal range of motion and neck supple.  Skin:    General: Skin is warm and dry.  Neurological:     Mental Status: She is alert and oriented to person, place, and time.  Psychiatric:        Behavior: Behavior normal.        Judgment: Judgment normal.      UC Treatments / Results  Labs (all labs ordered are listed, but only abnormal results are displayed) Labs Reviewed  POCT URINALYSIS  DIP (MANUAL ENTRY) - Abnormal; Notable for the following components:      Result Value   Blood, UA trace-lysed (*)    All other components within normal limits  URINE CULTURE  CERVICOVAGINAL ANCILLARY ONLY    EKG   Radiology No results found.  Procedures Procedures (including critical care time)  Medications Ordered in UC Medications - No data to display  Initial Impression / Assessment and Plan / UC Course  I have reviewed the triage vital signs and the nursing notes.  Pertinent labs & imaging results that were available during my care of the patient were reviewed by me and considered in my medical decision making (see chart for details).    Urine with trace blood. Negative leuks/nitrite. Will send for urine culture due to current symptoms. Discussed symptoms could also be caused by irritation/kidney stones. Will have patient push fluids for now. Cytology sent, patient will be contacted with any positive results that require additional treatment. Patient to refrain from sexual activity for the next 7 days. Return precautions given.   Final Clinical Impressions(s) / UC Diagnoses   Final diagnoses:  Urinary tract infection symptoms   ED Prescriptions    None     PDMP not reviewed this encounter.   Ok Edwards, PA-C 02/28/19 1300

## 2019-03-01 ENCOUNTER — Telehealth: Payer: Self-pay

## 2019-03-01 LAB — NOVEL CORONAVIRUS, NAA: SARS-CoV-2, NAA: NOT DETECTED

## 2019-03-01 LAB — CERVICOVAGINAL ANCILLARY ONLY
Chlamydia: NEGATIVE
Neisseria Gonorrhea: NEGATIVE
Trichomonas: NEGATIVE

## 2019-03-01 LAB — URINE CULTURE: Culture: NO GROWTH

## 2019-03-01 NOTE — Telephone Encounter (Signed)
Pt. Checking on COVID 19 results, not available yet. °

## 2019-03-02 ENCOUNTER — Other Ambulatory Visit: Payer: Medicaid Other

## 2019-03-15 ENCOUNTER — Ambulatory Visit: Payer: Medicaid Other | Attending: Internal Medicine

## 2019-03-15 ENCOUNTER — Other Ambulatory Visit: Payer: Self-pay

## 2019-03-15 DIAGNOSIS — Z20822 Contact with and (suspected) exposure to covid-19: Secondary | ICD-10-CM

## 2019-03-16 LAB — NOVEL CORONAVIRUS, NAA: SARS-CoV-2, NAA: NOT DETECTED

## 2019-03-28 ENCOUNTER — Telehealth: Payer: Self-pay | Admitting: Family Medicine

## 2019-03-28 DIAGNOSIS — M549 Dorsalgia, unspecified: Secondary | ICD-10-CM

## 2019-03-28 HISTORY — DX: Dorsalgia, unspecified: M54.9

## 2019-03-28 NOTE — Telephone Encounter (Signed)
Pt called and reminded of appointment 

## 2019-03-29 ENCOUNTER — Ambulatory Visit (HOSPITAL_COMMUNITY)
Admission: RE | Admit: 2019-03-29 | Discharge: 2019-03-29 | Disposition: A | Discharge status: Home / Self Care | Payer: Medicaid Other | Source: Ambulatory Visit | Attending: Family Medicine | Admitting: Family Medicine

## 2019-03-29 ENCOUNTER — Encounter: Payer: Self-pay | Admitting: Family Medicine

## 2019-03-29 ENCOUNTER — Other Ambulatory Visit: Payer: Self-pay

## 2019-03-29 ENCOUNTER — Ambulatory Visit (INDEPENDENT_AMBULATORY_CARE_PROVIDER_SITE_OTHER): Payer: Medicaid Other | Admitting: Family Medicine

## 2019-03-29 VITALS — BP 125/72 | HR 91 | Temp 98.0°F | Ht 65.0 in | Wt 157.4 lb

## 2019-03-29 DIAGNOSIS — M549 Dorsalgia, unspecified: Secondary | ICD-10-CM

## 2019-03-29 DIAGNOSIS — I10 Essential (primary) hypertension: Secondary | ICD-10-CM

## 2019-03-29 DIAGNOSIS — Z09 Encounter for follow-up examination after completed treatment for conditions other than malignant neoplasm: Secondary | ICD-10-CM | POA: Diagnosis not present

## 2019-03-29 DIAGNOSIS — M545 Low back pain: Secondary | ICD-10-CM | POA: Diagnosis not present

## 2019-03-29 NOTE — Progress Notes (Signed)
[facility]   Established Patient Office Visit  Subjective:  Patient ID: Regina Warren, female    DOB: 1995/04/01  Age: 24 y.o. MRN: [id number]  CC:  Chief Complaint  Patient presents with  . Back Pain    upper and lower back shifting     HPI Regina Warren is a 24 year old female who presents for Follow Up today   Past Medical History:  Diagnosis Date  . Abdominal pain   . Anxiety   . Vitamin D deficiency 06/2018   Current Status: Since her last office visit, she states that she had muscle shifting from spine to other parts of her back and flank area. She denies fevers, chills, fatigue, recent infections, weight loss, and night sweats. She has not had any headaches, visual changes, dizziness, and falls. No chest pain, heart palpitations, cough and shortness of breath reported. No reports of GI problems such as nausea, vomiting, diarrhea, and constipation. She has no reports of blood in stools, dysuria and hematuria. No depression or anxiety, and denies suicidal ideations, homicidal ideations, or auditory hallucinations. She denies pain today.   Past Surgical History:  Procedure Laterality Date  . lung puncture      Family History  Problem Relation Age of Onset  . Mental illness Neg Hx     Social History   Socioeconomic History  . Marital status: Single    Spouse name: Not on file  . Number of children: Not on file  . Years of education: Not on file  . Highest education level: Not on file  Occupational History  . Not on file  Tobacco Use  . Smoking status: Never Smoker  . Smokeless tobacco: Never Used  Substance and Sexual Activity  . Alcohol use: Yes    Comment: occasionally  . Drug use: Never  . Sexual activity: Yes    Birth control/protection: None  Other Topics Concern  . Not on file  Social History Narrative   ** Merged History Encounter **       Social Determinants of Health    Financial Resource Strain:   . Difficulty of Paying Living Expenses: Not on file  Food Insecurity:   . Worried About Programme researcher, broadcasting/film/video in the Last Year: Not on file  . Ran Out of Food in the Last Year: Not on file  Transportation Needs:   . Lack of Transportation (Medical): Not on file  . Lack of Transportation (Non-Medical): Not on file  Physical Activity:   . Days of Exercise per Week: Not on file  . Minutes of Exercise per Session: Not on file  Stress:   . Feeling of Stress : Not on file  Social Connections:   . Frequency of Communication with Friends and Family: Not on file  . Frequency of Social Gatherings with Friends and Family: Not on file  . Attends Religious Services: Not on file  . Active Member of Clubs or Organizations: Not on file  . Attends Banker Meetings: Not on file  . Marital Status: Not on file  Intimate Partner Violence:   . Fear of Current or Ex-Partner: Not on file  . Emotionally Abused: Not on file  . Physically Abused: Not on file  . Sexually Abused: Not on file    Outpatient Medications Prior to Visit  Medication Sig Dispense Refill  . naproxen sodium (ALEVE) 220 MG tablet Take 220 mg by mouth.    Marland Kitchen  Vitamin D, Ergocalciferol, (DRISDOL) 1.25 MG (50000 UT) CAPS capsule Take 1 capsule (50,000 Units total) by mouth every 7 (seven) days. 5 capsule 6   No facility-administered medications prior to visit.    Allergies  Allergen Reactions  . Imitrex [Sumatriptan]     "made my brain feel like it was about to explode"    ROS Review of Systems  Constitutional: Negative.   HENT: Negative.   Eyes: Negative.   Respiratory: Negative.   Cardiovascular: Negative.   Gastrointestinal: Negative.   Endocrine: Negative.   Genitourinary: Negative.   Musculoskeletal: Negative.        Back discomfort  Skin: Negative.   Allergic/Immunologic: Negative.   Neurological: Negative.   Hematological: Negative.   Psychiatric/Behavioral: Negative.        Objective:    Physical Exam  Constitutional: She is oriented to person, place, and time. She appears well-developed and well-nourished.  HENT:  Head: Normocephalic and atraumatic.  Eyes: Conjunctivae are normal.  Cardiovascular: Normal rate, regular rhythm, normal heart sounds and intact distal pulses.  Pulmonary/Chest: Effort normal and breath sounds normal.  Abdominal: Soft. Bowel sounds are normal.  Musculoskeletal:        General: Normal range of motion.     Cervical back: Normal range of motion and neck supple.  Neurological: She is alert and oriented to person, place, and time. She has normal reflexes.  Skin: Skin is warm and dry.  Psychiatric: She has a normal mood and affect. Her behavior is normal. Judgment and thought content normal.  Nursing note and vitals reviewed.   BP 125/72   Pulse 91   Temp 98 F (36.7 C) (Oral)   Ht 5\' 5"  (1.651 m)   Wt 157 lb 6.4 oz (71.4 kg)   LMP 03/08/2019   SpO2 99%   BMI 26.19 kg/m  Wt Readings from Last 3 Encounters:  03/29/19 157 lb 6.4 oz (71.4 kg)  02/01/19 164 lb 12.8 oz (74.8 kg)  07/23/18 160 lb 3.2 oz (72.7 kg)     Health Maintenance Due  Topic Date Due  . PAP SMEAR-Modifier  05/10/2019    There are no preventive care reminders to display for this patient.  Lab Results  Component Value Date   TSH 2.39 05/07/2016   Lab Results  Component Value Date   WBC 6.2 10/07/2017   HGB 12.7 10/07/2017   HCT 38.1 10/07/2017   MCV 96.2 10/07/2017   PLT 265 10/07/2017   Lab Results  Component Value Date   NA 138 10/07/2017   K 3.5 10/07/2017   CO2 23 10/07/2017   GLUCOSE 89 10/07/2017   BUN 8 10/07/2017   CREATININE 0.77 10/07/2017   BILITOT 0.6 08/12/2017   ALKPHOS 64 08/12/2017   AST 20 08/12/2017   ALT 20 08/12/2017   PROT 7.5 08/12/2017   ALBUMIN 4.6 08/12/2017   CALCIUM 8.8 (L) 10/07/2017   ANIONGAP 9 10/07/2017   No results found for: CHOL No results found for: HDL No results found for:  LDLCALC No results found for: TRIG No results found for: CHOLHDL No results found for: HGBA1C    Assessment & Plan:   1. Discomfort of back - DG Lumbar Spine Complete; Future  2. Hypertension, unspecified type Stable. Within normal range today.   3. Follow up She will follow up for Pap Smear as needed. She will follow up for Office Visit in 6 months.  She will follow up in 1 week for Labs Only.  Problem List Items Addressed This Visit    None    Visit Diagnoses    Discomfort of back    -  Primary   Relevant Orders   DG Lumbar Spine Complete (Completed)   Hypertension, unspecified type       Follow up          No orders of the defined types were placed in this encounter.   Follow-up: No follow-ups on file.    Kallie Locks, FNP

## 2019-04-01 ENCOUNTER — Telehealth: Payer: Self-pay | Admitting: Family Medicine

## 2019-04-01 NOTE — Telephone Encounter (Signed)
Pt was called and reminded of there appointment 

## 2019-04-02 ENCOUNTER — Encounter: Payer: Self-pay | Admitting: Family Medicine

## 2019-04-02 DIAGNOSIS — M549 Dorsalgia, unspecified: Secondary | ICD-10-CM | POA: Insufficient documentation

## 2019-04-04 ENCOUNTER — Other Ambulatory Visit: Payer: Self-pay | Admitting: Family Medicine

## 2019-04-04 ENCOUNTER — Ambulatory Visit: Payer: Self-pay | Admitting: Family Medicine

## 2019-04-04 ENCOUNTER — Other Ambulatory Visit: Payer: Self-pay

## 2019-04-04 DIAGNOSIS — M549 Dorsalgia, unspecified: Secondary | ICD-10-CM

## 2019-04-08 ENCOUNTER — Telehealth: Payer: Self-pay | Admitting: Family Medicine

## 2019-04-08 NOTE — Telephone Encounter (Signed)
Pt called requesting her vaccination records be resent via e-mail. Pt sates they were sent by e-mail previously. She also wants her flu shot included. Her e-mail is [email or web address]. Pt needs them resent by today.

## 2019-04-11 ENCOUNTER — Other Ambulatory Visit: Payer: Self-pay

## 2019-04-13 ENCOUNTER — Other Ambulatory Visit: Payer: Self-pay

## 2019-04-13 ENCOUNTER — Ambulatory Visit (INDEPENDENT_AMBULATORY_CARE_PROVIDER_SITE_OTHER): Payer: Medicaid Other | Admitting: Family Medicine

## 2019-04-13 ENCOUNTER — Encounter: Payer: Self-pay | Admitting: Family Medicine

## 2019-04-13 VITALS — Ht 65.0 in | Wt 161.4 lb

## 2019-04-13 DIAGNOSIS — Z124 Encounter for screening for malignant neoplasm of cervix: Secondary | ICD-10-CM

## 2019-04-13 DIAGNOSIS — Z09 Encounter for follow-up examination after completed treatment for conditions other than malignant neoplasm: Secondary | ICD-10-CM

## 2019-04-13 DIAGNOSIS — Z0000 Encounter for general adult medical examination without abnormal findings: Secondary | ICD-10-CM | POA: Diagnosis not present

## 2019-04-13 LAB — POCT URINALYSIS DIPSTICK
Bilirubin, UA: NEGATIVE
Blood, UA: NEGATIVE
Glucose, UA: NEGATIVE
Ketones, UA: NEGATIVE
Leukocytes, UA: NEGATIVE
Nitrite, UA: NEGATIVE
Protein, UA: NEGATIVE
Spec Grav, UA: 1.025 (ref 1.010–1.025)
Urobilinogen, UA: 0.2 E.U./dL
pH, UA: 7.5 (ref 5.0–8.0)

## 2019-04-13 LAB — POCT GLYCOSYLATED HEMOGLOBIN (HGB A1C): Hemoglobin A1C: 5 % (ref 4.0–5.6)

## 2019-04-13 LAB — GLUCOSE, POCT (MANUAL RESULT ENTRY): POC Glucose: 90 mg/dl (ref 70–99)

## 2019-04-13 NOTE — Progress Notes (Signed)
[facility]   Pap Smear  Subjective:  Patient ID: Regina Warren, female    DOB: [birth year]  Age: 24 y.o. MRN: [id number]  CC:  Chief Complaint  Patient presents with  . Follow-up    PAP    HPI Regina Warren is a 24 year old female who presents for Pap Smear today.   Past Medical History:  Diagnosis Date  . Abdominal pain   . Anxiety   . Discomfort of back 03/2019  . Vitamin D deficiency 06/2018   Gynecological Exam  Patient here for routine gynecological exam. No reports that she has had a history of an abnormal Pap smear. She did not report that she is sexually active. She denies abnormal vaginal discharge, vaginal  itching, vaginal burning, or dyspareunia. She does not perform monthly self breast exams. She does have any family history of cancer. She typically follows a balanced diet and is in good physical shape. Body mass index is 26.86.  Gynecologic History Regular periods usually last normal days. No discharge, discomfort, irregular bleeding, abdominal pain reported.   Obstetric History        OB History  Gravida Para Term Preterm AB Living  1 0 1  0 0  SAB TAB Ectopic Multiple Live Births   0 0 0 0 0     Current Status Since her last office visit, she is doing well with no complaints. She denies fevers, chills, fatigue, recent infections, weight loss, and night sweats. She has not had any headaches, visual changes, dizziness, and falls. No chest pain, heart palpitations, cough and shortness of breath reported. No reports of GI problems such as nausea, vomiting, diarrhea, and constipation. She has no reports of blood in stools, dysuria and hematuria. No depression or anxiety, and denies suicidal ideations, homicidal ideations, or auditory hallucinations. She denies pain today.  Past Surgical History:  Procedure Laterality Date  . lung puncture      Family History  Problem Relation Age of Onset   . Mental illness Neg Hx     Social History   Socioeconomic History  . Marital status: Single    Spouse name: Not on file  . Number of children: Not on file  . Years of education: Not on file  . Highest education level: Not on file  Occupational History  . Not on file  Tobacco Use  . Smoking status: Never Smoker  . Smokeless tobacco: Never Used  Substance and Sexual Activity  . Alcohol use: Yes    Comment: occasionally  . Drug use: Never  . Sexual activity: Yes    Birth control/protection: None  Other Topics Concern  . Not on file  Social History Narrative   ** Merged History Encounter **       Social Determinants of Health   Financial Resource Strain:   . Difficulty of Paying Living Expenses:   Food Insecurity:   . Worried About Programme researcher, broadcasting/film/video in the Last Year:   . Barista in the Last Year:   Transportation Needs:   . Freight forwarder (Medical):   Marland Kitchen Lack of Transportation (Non-Medical):   Physical Activity:   . Days of Exercise per Week:   . Minutes of Exercise per Session:   Stress:   . Feeling of Stress :   Social Connections:   . Frequency of Communication with Friends and Family:   . Frequency of Social Gatherings with Friends  and Family:   . Attends Religious Services:   . Active Member of Clubs or Organizations:   . Attends Banker Meetings:   Marland Kitchen Marital Status:   Intimate Partner Violence:   . Fear of Current or Ex-Partner:   . Emotionally Abused:   Marland Kitchen Physically Abused:   . Sexually Abused:     Outpatient Medications Prior to Visit  Medication Sig Dispense Refill  . naproxen sodium (ALEVE) 220 MG tablet Take 220 mg by mouth.    . Vitamin D, Ergocalciferol, (DRISDOL) 1.25 MG (50000 UT) CAPS capsule Take 1 capsule (50,000 Units total) by mouth every 7 (seven) days. 5 capsule 6   No facility-administered medications prior to visit.    Allergies  Allergen Reactions  . Imitrex [Sumatriptan]     "made my brain feel  like it was about to explode"    ROS Review of Systems  Eyes: Negative.   Respiratory: Negative.   Cardiovascular: Negative.   Gastrointestinal: Negative.   Endocrine: Negative.   Genitourinary: Negative.   Musculoskeletal: Negative.   Skin: Negative.   Allergic/Immunologic: Negative.   Psychiatric/Behavioral: Negative.       Objective:    Physical Exam  Constitutional: She is oriented to person, place, and time. She appears well-developed and well-nourished.  HENT:  Head: Normocephalic and atraumatic.  Eyes: Conjunctivae are normal.  Cardiovascular: Normal rate, regular rhythm, normal heart sounds and intact distal pulses.  Pulmonary/Chest: Effort normal and breath sounds normal.  Abdominal: Soft. Bowel sounds are normal.  Genitourinary:    Vagina normal.   Musculoskeletal:        General: Normal range of motion.     Cervical back: Normal range of motion and neck supple.  Neurological: She is alert and oriented to person, place, and time. She has normal reflexes.  Skin: Skin is warm.  Psychiatric: She has a normal mood and affect. Her behavior is normal. Judgment and thought content normal.    Ht 5\' 5"  (1.651 m)   Wt 161 lb 6.4 oz (73.2 kg)   BMI 26.86 kg/m  Wt Readings from Last 3 Encounters:  04/13/19 161 lb 6.4 oz (73.2 kg)  03/29/19 157 lb 6.4 oz (71.4 kg)  02/01/19 164 lb 12.8 oz (74.8 kg)     Health Maintenance Due  Topic Date Due  . PAP SMEAR-Modifier  05/10/2019    There are no preventive care reminders to display for this patient.  Lab Results  Component Value Date   TSH 2.39 05/07/2016   Lab Results  Component Value Date   WBC 6.2 10/07/2017   HGB 12.7 10/07/2017   HCT 38.1 10/07/2017   MCV 96.2 10/07/2017   PLT 265 10/07/2017   Lab Results  Component Value Date   NA 138 10/07/2017   K 3.5 10/07/2017   CO2 23 10/07/2017   GLUCOSE 89 10/07/2017   BUN 8 10/07/2017   CREATININE 0.77 10/07/2017   BILITOT 0.6 08/12/2017   ALKPHOS 64  08/12/2017   AST 20 08/12/2017   ALT 20 08/12/2017   PROT 7.5 08/12/2017   ALBUMIN 4.6 08/12/2017   CALCIUM 8.8 (L) 10/07/2017   ANIONGAP 9 10/07/2017   No results found for: CHOL No results found for: HDL No results found for: LDLCALC No results found for: TRIG No results found for: CHOLHDL No results found for: 12/07/2017    Assessment & Plan:   1. Pap smear for cervical cancer screening Tolerated exam well. She will perform monthly breasts exam,  increase physical active, continue a healthy diet, minimize tobacco and alcoholic beverages.  - IGP, rfx Aptima HPV ASCU  2. Health care maintenance - POCT glucose (manual entry) - POCT glycosylated hemoglobin (Hb A1C) - POCT urinalysis dipstick  3. Follow up She will follow up as needed.   No orders of the defined types were placed in this encounter.   Orders Placed This Encounter  Procedures  . POCT glucose (manual entry)  . POCT glycosylated hemoglobin (Hb A1C)  . POCT urinalysis dipstick    Referral Orders  No referral(s) requested today   Kathe Becton,  MSN, FNP-BC [address] [place], [place] [phone]- fax    Problem List Items Addressed This Visit    None    Visit Diagnoses    Health care maintenance    -  Primary   Relevant Orders   POCT glucose (manual entry)   POCT glycosylated hemoglobin (Hb A1C)   POCT urinalysis dipstick      No orders of the defined types were placed in this encounter.   Follow-up: No follow-ups on file.    Azzie Glatter, FNP

## 2019-04-15 LAB — IGP, RFX APTIMA HPV ASCU

## 2019-04-18 ENCOUNTER — Encounter: Payer: Self-pay | Admitting: Family Medicine

## 2019-05-22 ENCOUNTER — Ambulatory Visit
Admission: EM | Admit: 2019-05-22 | Discharge: 2019-05-22 | Disposition: A | Discharge status: Home / Self Care | Payer: Medicaid Other | Attending: Emergency Medicine | Admitting: Emergency Medicine

## 2019-05-22 ENCOUNTER — Other Ambulatory Visit: Payer: Self-pay

## 2019-05-22 ENCOUNTER — Encounter: Payer: Self-pay | Admitting: Emergency Medicine

## 2019-05-22 DIAGNOSIS — Z113 Encounter for screening for infections with a predominantly sexual mode of transmission: Secondary | ICD-10-CM

## 2019-05-22 DIAGNOSIS — N3001 Acute cystitis with hematuria: Secondary | ICD-10-CM | POA: Diagnosis not present

## 2019-05-22 LAB — POCT URINALYSIS DIP (MANUAL ENTRY)
Bilirubin, UA: NEGATIVE
Blood, UA: NEGATIVE
Glucose, UA: NEGATIVE mg/dL
Ketones, POC UA: NEGATIVE mg/dL
Nitrite, UA: NEGATIVE
Protein Ur, POC: NEGATIVE mg/dL
Spec Grav, UA: 1.025 (ref 1.010–1.025)
Urobilinogen, UA: 0.2 E.U./dL
pH, UA: 7 (ref 5.0–8.0)

## 2019-05-22 LAB — POCT URINE PREGNANCY: Preg Test, Ur: NEGATIVE

## 2019-05-22 MED ORDER — CEPHALEXIN 500 MG PO CAPS: 500.0000 mg | ORAL_CAPSULE | Freq: Two times a day (BID) | ORAL | 0 refills | Status: AC

## 2019-05-22 NOTE — Discharge Instructions (Signed)
Take antibiotic twice daily with food. Important to drink plenty of water throughout the day. May continue Azo as needed for burning sensation. Return for worsening urinary symptoms, blood in urine, abdominal or back pain, fever. 

## 2019-05-22 NOTE — ED Provider Notes (Signed)
[name]    CSN: [id number] Arrival date & time: 05/22/19  1115      History   Chief Complaint Chief Complaint  Patient presents with  . Urinary Tract Infection    HPI Regina Warren is a 24 y.o. female presented for 5-day course of bladder distention, urinary frequency and urgency.  Denies hematuria, vaginal or pelvic pain, vaginal discharge, back or belly pain, fever.  Has not taken any for symptoms.  Denies history of frequent UTI.  Patient requesting STD testing: Currently sexually active with 1 female partner, does use condoms.  Denies known exposure.    Past Medical History:  Diagnosis Date  . Abdominal pain   . Anxiety   . Discomfort of back 03/2019  . Vitamin D deficiency 06/2018    Patient Active Problem List   Diagnosis Date Noted  . Discomfort of back 04/02/2019  . Intractable headache 07/25/2018  . Right lower quadrant abdominal pain 05/21/2018  . Urinary tract infection without hematuria 05/21/2018  . Pelvic pain 03/30/2018  . Vaginal discharge 03/30/2018  . Elevated BP 03/14/2014    Past Surgical History:  Procedure Laterality Date  . lung puncture      OB History    Gravida  1   Para      Term      Preterm      AB      Living  1     SAB      TAB      Ectopic      Multiple      Live Births               Home Medications    Prior to Admission medications   Medication Sig Start Date End Date Taking? Authorizing Provider  cephALEXin (KEFLEX) 500 MG capsule Take 1 capsule (500 mg total) by mouth 2 (two) times daily for 3 days. 05/22/19 05/25/19  Hall-Potvin, [place], PA-C  naproxen sodium (ALEVE) 220 MG tablet Take 220 mg by mouth.    [provider]  Vitamin D, Ergocalciferol, (DRISDOL) 1.25 MG (50000 UT) CAPS capsule Take 1 capsule (50,000 Units total) by mouth every 7 (seven) days. 02/01/19   Kallie Locks, FNP    Family History Family History  Problem Relation Age of Onset  . Mental  illness Neg Hx     Social History Social History   Tobacco Use  . Smoking status: Never Smoker  . Smokeless tobacco: Never Used  Substance Use Topics  . Alcohol use: Yes    Comment: occasionally  . Drug use: Never     Allergies   Imitrex [sumatriptan]   Review of Systems As per HPI   Physical Exam Triage Vital Signs ED Triage Vitals  Enc Vitals Group     BP      Pulse      Resp      Temp      Temp src      SpO2      Weight      Height      Head Circumference      Peak Flow      Pain Score      Pain Loc      Pain Edu?      Excl. in GC?    No data found.  Updated Vital Signs BP (!) 135/94 (BP Location: Left Arm)   Pulse 79   Temp 98.1 F (36.7 C) (Oral)  Resp 18   LMP 05/04/2019   SpO2 98%   Visual Acuity Right Eye Distance:   Left Eye Distance:   Bilateral Distance:    Right Eye Near:   Left Eye Near:    Bilateral Near:     Physical Exam Constitutional:      General: She is not in acute distress. HENT:     Head: Normocephalic and atraumatic.  Eyes:     General: No scleral icterus.    Pupils: Pupils are equal, round, and reactive to light.  Cardiovascular:     Rate and Rhythm: Normal rate.  Pulmonary:     Effort: Pulmonary effort is normal.  Abdominal:     General: Bowel sounds are normal.     Palpations: Abdomen is soft.     Tenderness: There is no abdominal tenderness. There is no right CVA tenderness, left CVA tenderness or guarding.  Genitourinary:    Comments: Patient declined, self-swab performed Skin:    Coloration: Skin is not jaundiced or pale.  Neurological:     Mental Status: She is alert and oriented to person, place, and time.      UC Treatments / Results  Labs (all labs ordered are listed, but only abnormal results are displayed) Labs Reviewed  POCT URINALYSIS DIP (MANUAL ENTRY) - Abnormal; Notable for the following components:      Result Value   Leukocytes, UA Trace (*)    All other components within normal  limits  POCT URINE PREGNANCY - Normal  URINE CULTURE  CERVICOVAGINAL ANCILLARY ONLY    EKG   Radiology No results found.  Procedures Procedures (including critical care time)  Medications Ordered in UC Medications - No data to display  Initial Impression / Assessment and Plan / UC Course  I have reviewed the triage vital signs and the nursing notes.  Pertinent labs & imaging results that were available during my care of the patient were reviewed by me and considered in my medical decision making (see chart for details).     Patient afebrile, nontoxic in office today.  Urine pregnancy negative, urine dipstick significant for trace leukocytes.  Culture pending.  Will start Keflex today given urinary symptoms.  Cytology pending: We will treat if indicated.  Return precautions discussed, patient verbalized understanding and is agreeable to plan. Final Clinical Impressions(s) / UC Diagnoses   Final diagnoses:  Acute cystitis with hematuria  Screening for venereal disease     Discharge Instructions     Take antibiotic twice daily with food. Important to drink plenty of water throughout the day. May continue Azo as needed for burning sensation. Return for worsening urinary symptoms, blood in urine, abdominal or back pain, fever.    ED Prescriptions    Medication Sig Dispense Auth. Provider   cephALEXin (KEFLEX) 500 MG capsule Take 1 capsule (500 mg total) by mouth 2 (two) times daily for 3 days. 6 capsule Hall-Potvin, [place], PA-C     PDMP not reviewed this encounter.   Hall-Potvin, [place], [place] 05/22/19 1247

## 2019-05-22 NOTE — ED Triage Notes (Signed)
Onset 5 days ago of uti symptoms.  Feels like a full bladder, urinating frequently and small amounts.

## 2019-05-24 LAB — URINE CULTURE: Culture: NO GROWTH

## 2019-05-24 LAB — CERVICOVAGINAL ANCILLARY ONLY
Chlamydia: NEGATIVE
Comment: NEGATIVE
Comment: NEGATIVE
Comment: NORMAL
Neisseria Gonorrhea: NEGATIVE
Trichomonas: NEGATIVE

## 2019-07-20 ENCOUNTER — Telehealth: Payer: Self-pay | Admitting: Family Medicine

## 2019-07-20 NOTE — Telephone Encounter (Signed)
Please schedule patient an appointment so she can be seen and evaluated. Thanks

## 2019-07-20 NOTE — Telephone Encounter (Signed)
Pt wants to a call back to discuss FMLA due to her mental health and headaches.

## 2019-07-25 ENCOUNTER — Other Ambulatory Visit: Payer: Self-pay

## 2019-07-25 ENCOUNTER — Encounter: Payer: Self-pay | Admitting: Family Medicine

## 2019-07-25 ENCOUNTER — Ambulatory Visit (INDEPENDENT_AMBULATORY_CARE_PROVIDER_SITE_OTHER): Payer: Medicaid Other | Admitting: Family Medicine

## 2019-07-25 VITALS — BP 131/86 | HR 76 | Temp 98.1°F | Ht 65.0 in | Wt 160.0 lb

## 2019-07-25 DIAGNOSIS — F419 Anxiety disorder, unspecified: Secondary | ICD-10-CM

## 2019-07-25 DIAGNOSIS — Z09 Encounter for follow-up examination after completed treatment for conditions other than malignant neoplasm: Secondary | ICD-10-CM

## 2019-07-25 DIAGNOSIS — N3000 Acute cystitis without hematuria: Secondary | ICD-10-CM

## 2019-07-25 DIAGNOSIS — I10 Essential (primary) hypertension: Secondary | ICD-10-CM

## 2019-07-25 NOTE — Progress Notes (Signed)
[facility]  Urgent Care Follow Up  Subjective:  Patient ID: Regina Warren, female    DOB: 1996-01-21  Age: 24 y.o. MRN: [id number]  CC:  Chief Complaint  Patient presents with  . Follow-up    FMLA Forms; Stress related issues.     HPI Regina Warren is a 24 year old female who presents for Follow Up today.    Patient Active Problem List   Diagnosis Date Noted  . Discomfort of back 04/02/2019  . Intractable headache 07/25/2018  . Right lower quadrant abdominal pain 05/21/2018  . Urinary tract infection without hematuria 05/21/2018  . Pelvic pain 03/30/2018  . Vaginal discharge 03/30/2018  . Elevated BP 03/14/2014    Past Medical History:  Diagnosis Date  . Abdominal pain   . Anxiety   . Discomfort of back 03/2019  . Vitamin D deficiency 06/2018    Current Status: Since her last office visit, she has had an Urgent Care visit on 05/22/2019 for UTI. She was treated and discharged home. Today, she is doing well with no complaints. No reports of urinary frequency, discharge, dysuria, urinary itching, burning, odor, hematuria, and suprapubic pain/discomfort today. She states that her anxiety has increased since her last office visit, r/t job and personal stressors. She reports frequent headaches. She reports heart palpitations. She denies suicidal ideations, homicidal ideations, or auditory hallucinations. She denies fevers, chills, fatigue, recent infections, weight loss, and night sweats. She has not had any visual changes, dizziness, and falls. No chest pain, heart palpitations, cough and shortness of breath reported. Denies GI problems such as nausea, vomiting, diarrhea, and constipation. She has no reports of blood in stools. She is taking all medications as prescribed. She denies pain today.   Past Surgical History:  Procedure Laterality Date  . lung puncture      Family History  Problem Relation Age of  Onset  . Mental illness Neg Hx     Social History   Socioeconomic History  . Marital status: Single    Spouse name: Not on file  . Number of children: Not on file  . Years of education: Not on file  . Highest education level: Not on file  Occupational History  . Not on file  Tobacco Use  . Smoking status: Never Smoker  . Smokeless tobacco: Never Used  Vaping Use  . Vaping Use: Never used  Substance and Sexual Activity  . Alcohol use: Yes    Comment: occasionally  . Drug use: Never  . Sexual activity: Yes    Birth control/protection: None  Other Topics Concern  . Not on file  Social History Narrative   ** Merged History Encounter **       Social Determinants of Health   Financial Resource Strain:   . Difficulty of Paying Living Expenses:   Food Insecurity:   . Worried About Programme researcher, broadcasting/film/video in the Last Year:   . Barista in the Last Year:   Transportation Needs:   . Freight forwarder (Medical):   Marland Kitchen Lack of Transportation (Non-Medical):   Physical Activity:   . Days of Exercise per Week:   . Minutes of Exercise per Session:   Stress:   . Feeling of Stress :   Social Connections:   . Frequency of Communication with Friends and Family:   . Frequency of Social Gatherings with Friends and Family:   . Attends Religious Services:   .  Active Member of Clubs or Organizations:   . Attends Banker Meetings:   Marland Kitchen Marital Status:   Intimate Partner Violence:   . Fear of Current or Ex-Partner:   . Emotionally Abused:   Marland Kitchen Physically Abused:   . Sexually Abused:     Outpatient Medications Prior to Visit  Medication Sig Dispense Refill  . naproxen sodium (ALEVE) 220 MG tablet Take 220 mg by mouth.    . Vitamin D, Ergocalciferol, (DRISDOL) 1.25 MG (50000 UT) CAPS capsule Take 1 capsule (50,000 Units total) by mouth every 7 (seven) days. 5 capsule 6   No facility-administered medications prior to visit.    Allergies  Allergen Reactions  .  Imitrex [Sumatriptan]     "made my brain feel like it was about to explode"    ROS Review of Systems  Constitutional: Negative.   HENT: Negative.   Eyes: Negative.   Respiratory: Negative.   Cardiovascular: Negative.   Gastrointestinal: Negative.   Endocrine: Negative.   Genitourinary: Negative.   Musculoskeletal: Negative.   Skin: Negative.   Allergic/Immunologic: Negative.   Hematological: Negative.   Psychiatric/Behavioral: Negative.       Objective:    Physical Exam Vitals and nursing note reviewed.  Constitutional:      Appearance: Normal appearance.  HENT:     Head: Normocephalic and atraumatic.     Mouth/Throat:     Mouth: Mucous membranes are moist.     Pharynx: Oropharynx is clear.  Cardiovascular:     Rate and Rhythm: Normal rate and regular rhythm.     Pulses: Normal pulses.     Heart sounds: Normal heart sounds.  Pulmonary:     Effort: Pulmonary effort is normal.     Breath sounds: Normal breath sounds.  Abdominal:     General: Abdomen is flat. Bowel sounds are normal.     Palpations: Abdomen is soft.  Musculoskeletal:        General: Normal range of motion.     Cervical back: Normal range of motion and neck supple.  Skin:    General: Skin is warm and dry.  Neurological:     General: No focal deficit present.     Mental Status: She is alert and oriented to person, place, and time.  Psychiatric:        Mood and Affect: Mood normal.        Behavior: Behavior normal.        Thought Content: Thought content normal.        Judgment: Judgment normal.     BP 131/86 (BP Location: Right Arm, Patient Position: Sitting, Cuff Size: Small)   Pulse 76   Temp 98.1 F (36.7 C)   Ht 5\' 5"  (1.651 m)   Wt 160 lb (72.6 kg)   SpO2 98%   BMI 26.63 kg/m  Wt Readings from Last 3 Encounters:  07/25/19 160 lb (72.6 kg)  04/13/19 161 lb 6.4 oz (73.2 kg)  03/29/19 157 lb 6.4 oz (71.4 kg)     Health Maintenance Due  Topic Date Due  . COVID-19 Vaccine (1)  Never done  . PAP-Cervical Cytology Screening  05/10/2019    There are no preventive care reminders to display for this patient.  Lab Results  Component Value Date   TSH 2.39 05/07/2016   Lab Results  Component Value Date   WBC 3.6 07/25/2019   HGB 12.7 07/25/2019   HCT 38.3 07/25/2019   MCV 96 07/25/2019   PLT 242  07/25/2019   Lab Results  Component Value Date   NA 138 07/25/2019   K 4.2 07/25/2019   CO2 21 07/25/2019   GLUCOSE 90 07/25/2019   BUN 9 07/25/2019   CREATININE 0.70 07/25/2019   BILITOT 0.3 07/25/2019   ALKPHOS 61 07/25/2019   AST 17 07/25/2019   ALT 13 07/25/2019   PROT 6.7 07/25/2019   ALBUMIN 4.1 07/25/2019   CALCIUM 9.1 07/25/2019   ANIONGAP 9 10/07/2017   No results found for: CHOL No results found for: HDL No results found for: LDLCALC No results found for: TRIG No results found for: [facility] Lab Results  Component Value Date   HGBA1C 5.0 04/13/2019      Assessment & Plan:   1. Hospital discharge follow-up  2. Acute cystitis without hematuria Resolved.   3. Hypertension, unspecified type The current medical regimen is effective; blood pressure is stable at 131/86b today; continue present plan and medications as prescribed. She will continue to take medications as prescribed, to decrease high sodium intake, excessive alcohol intake, increase potassium intake, smoking cessation, and increase physical activity of at least 30 minutes of cardio activity daily. She will continue to follow Heart Healthy or DASH diet. - CBC with Differential - Comprehensive metabolic panel; Future - Comprehensive metabolic panel  4. Anxiety Stable today.   5. Follow up She will follow up in 3 months.   No orders of the defined types were placed in this encounter.   Orders Placed This Encounter  Procedures  . CBC with Differential  . Comprehensive metabolic panel    Referral Orders  No referral(s) requested today   Kathe Becton,  MSN,  FNP-BC [facility] [address] [place], [place] [address] [phone] fax  Problem List Items Addressed This Visit      Genitourinary   Urinary tract infection without hematuria    Other Visit Diagnoses    Hospital discharge follow-up    -  Primary   Hypertension, unspecified type       Relevant Orders   CBC with Differential (Completed)   Comprehensive metabolic panel (Completed)   Anxiety       Follow up          No orders of the defined types were placed in this encounter.   Follow-up: No follow-ups on file.    Azzie Glatter, FNP

## 2019-07-26 LAB — CBC WITH DIFFERENTIAL/PLATELET
Basophils Absolute: 0 10*3/uL (ref 0.0–0.2)
Basos: 1 %
EOS (ABSOLUTE): 0 10*3/uL (ref 0.0–0.4)
Eos: 1 %
Hematocrit: 38.3 % (ref 34.0–46.6)
Hemoglobin: 12.7 g/dL (ref 11.1–15.9)
Immature Grans (Abs): 0 10*3/uL (ref 0.0–0.1)
Immature Granulocytes: 0 %
Lymphocytes Absolute: 1.4 10*3/uL (ref 0.7–3.1)
Lymphs: 38 %
MCH: 31.7 pg (ref 26.6–33.0)
MCHC: 33.2 g/dL (ref 31.5–35.7)
MCV: 96 fL (ref 79–97)
Monocytes Absolute: 0.4 10*3/uL (ref 0.1–0.9)
Monocytes: 10 %
Neutrophils Absolute: 1.8 10*3/uL (ref 1.4–7.0)
Neutrophils: 50 %
Platelets: 242 10*3/uL (ref 150–450)
RBC: 4.01 x10E6/uL (ref 3.77–5.28)
RDW: 12 % (ref 11.7–15.4)
WBC: 3.6 10*3/uL (ref 3.4–10.8)

## 2019-07-26 LAB — COMPREHENSIVE METABOLIC PANEL
ALT: 13 IU/L (ref 0–32)
AST: 17 IU/L (ref 0–40)
Albumin/Globulin Ratio: 1.6 (ref 1.2–2.2)
Albumin: 4.1 g/dL (ref 3.9–5.0)
Alkaline Phosphatase: 61 IU/L (ref 48–121)
BUN/Creatinine Ratio: 13 (ref 9–23)
BUN: 9 mg/dL (ref 6–20)
Bilirubin Total: 0.3 mg/dL (ref 0.0–1.2)
CO2: 21 mmol/L (ref 20–29)
Calcium: 9.1 mg/dL (ref 8.7–10.2)
Chloride: 104 mmol/L (ref 96–106)
Creatinine, Ser: 0.7 mg/dL (ref 0.57–1.00)
GFR calc Af Amer: 139 mL/min/{1.73_m2} (ref >59)
GFR calc non Af Amer: 121 mL/min/{1.73_m2} (ref >59)
Globulin, Total: 2.6 g/dL (ref 1.5–4.5)
Glucose: 90 mg/dL (ref 65–99)
Potassium: 4.2 mmol/L (ref 3.5–5.2)
Sodium: 138 mmol/L (ref 134–144)
Total Protein: 6.7 g/dL (ref 6.0–8.5)

## 2019-08-03 ENCOUNTER — Telehealth: Payer: Self-pay | Admitting: Family Medicine

## 2019-08-04 NOTE — Telephone Encounter (Signed)
Done

## 2019-08-05 ENCOUNTER — Ambulatory Visit: Payer: Self-pay | Admitting: Family Medicine

## 2019-08-08 ENCOUNTER — Ambulatory Visit (INDEPENDENT_AMBULATORY_CARE_PROVIDER_SITE_OTHER): Payer: Medicaid Other | Admitting: Family Medicine

## 2019-08-08 ENCOUNTER — Other Ambulatory Visit: Payer: Self-pay

## 2019-08-08 DIAGNOSIS — Z09 Encounter for follow-up examination after completed treatment for conditions other than malignant neoplasm: Secondary | ICD-10-CM | POA: Diagnosis not present

## 2019-08-08 DIAGNOSIS — F419 Anxiety disorder, unspecified: Secondary | ICD-10-CM

## 2019-08-08 DIAGNOSIS — R519 Headache, unspecified: Secondary | ICD-10-CM

## 2019-08-08 MED ORDER — IBUPROFEN 800 MG PO TABS: 800.0000 mg | ORAL_TABLET | Freq: Three times a day (TID) | ORAL | 3 refills | Status: DC | PRN

## 2019-08-08 NOTE — Progress Notes (Addendum)
Virtual Visit via Telephone Note  I connected with Regina Warren on 08/08/19 at 10:00 AM EDT by telephone and verified that I am speaking with the correct person using two identifiers.   I discussed the limitations, risks, security and privacy concerns of performing an evaluation and management service by telephone and the availability of in person appointments. I also discussed with the patient that there may be a patient responsible charge related to this service. The patient expressed understanding and agreed to proceed.  Televisit Today Patient Location: Home Provider Location: Office  History of Present Illness:  Past Surgical History:  Procedure Laterality Date  . lung puncture     Social History   Socioeconomic History  . Marital status: Single    Spouse name: Not on file  . Number of children: Not on file  . Years of education: Not on file  . Highest education level: Not on file  Occupational History  . Not on file  Tobacco Use  . Smoking status: Never Smoker  . Smokeless tobacco: Never Used  Vaping Use  . Vaping Use: Never used  Substance and Sexual Activity  . Alcohol use: Yes    Comment: occasionally  . Drug use: Never  . Sexual activity: Yes    Birth control/protection: None  Other Topics Concern  . Not on file  Social History Narrative   ** Merged History Encounter **       Social Determinants of Health   Financial Resource Strain:   . Difficulty of Paying Living Expenses:   Food Insecurity:   . Worried About Programme researcher, broadcasting/film/video in the Last Year:   . Barista in the Last Year:   Transportation Needs:   . Freight forwarder (Medical):   Marland Kitchen Lack of Transportation (Non-Medical):   Physical Activity:   . Days of Exercise per Week:   . Minutes of Exercise per Session:   Stress:   . Feeling of Stress :   Social Connections:   . Frequency of Communication with Friends and Family:   . Frequency of Social Gatherings with Friends and  Family:   . Attends Religious Services:   . Active Member of Clubs or Organizations:   . Attends Banker Meetings:   Marland Kitchen Marital Status:   Intimate Partner Violence:   . Fear of Current or Ex-Partner:   . Emotionally Abused:   Marland Kitchen Physically Abused:   . Sexually Abused:     Past Medical History:  Diagnosis Date  . Abdominal pain   . Anxiety   . Discomfort of back 03/2019  . Vitamin D deficiency 06/2018    Patient Active Problem List   Diagnosis Date Noted  . Discomfort of back 04/02/2019  . Intractable headache 07/25/2018  . Right lower quadrant abdominal pain 05/21/2018  . Urinary tract infection without hematuria 05/21/2018  . Pelvic pain 03/30/2018  . Vaginal discharge 03/30/2018  . Elevated BP 03/14/2014    Allergies  Allergen Reactions  . Imitrex [Sumatriptan]     "made my brain feel like it was about to explode"    Patient Active Problem List   Diagnosis Date Noted  . Discomfort of back 04/02/2019  . Intractable headache 07/25/2018  . Right lower quadrant abdominal pain 05/21/2018  . Urinary tract infection without hematuria 05/21/2018  . Pelvic pain 03/30/2018  . Vaginal discharge 03/30/2018  . Elevated BP 03/14/2014    Current Status: Since her last office visit, she continues to  have chronic headaches. She is currently taking OTC pain medications, which is not effective in relieving headaches. She has not had any visual changes, dizziness, and falls. She denies fevers, chills, fatigue, recent infections, weight loss, and night sweats.  No chest pain, heart palpitations, cough and shortness of breath reported. Denies GI problems such as nausea, vomiting, diarrhea, and constipation. She has no reports of blood in stools, dysuria and hematuria. She has recently decided to make a career change and is no longer at her previous job. She is taking all medications as prescribed.    Observations/Objective:  Telephone Visit  Assessment and Plan:  1.  Intractable headache, unspecified chronicity pattern, unspecified headache type We will initiate Motrin today.  - ibuprofen (ADVIL) 800 MG tablet; Take 1 tablet (800 mg total) by mouth every 8 (eight) hours as needed.  Dispense: 30 tablet; Refill: 3  2. Anxiety Stable today.   3. Follow up She will follow up in 09/2019.   Meds ordered this encounter  Medications  . ibuprofen (ADVIL) 800 MG tablet    Sig: Take 1 tablet (800 mg total) by mouth every 8 (eight) hours as needed.    Dispense:  30 tablet    Refill:  3    No orders of the defined types were placed in this encounter.   Referral Orders  No referral(s) requested today    Raliegh Ip,  MSN, FNP-BC [facility] [address] [place], [place] [address] [phone] fax   I discussed the assessment and treatment plan with the patient. The patient was provided an opportunity to ask questions and all were answered. The patient agreed with the plan and demonstrated an understanding of the instructions.   The patient was advised to call back or seek an in-person evaluation if the symptoms worsen or if the condition fails to improve as anticipated.  I provided 20 minutes of non-face-to-face time during this encounter.   Kallie Locks, FNP

## 2019-08-12 ENCOUNTER — Ambulatory Visit
Admission: EM | Admit: 2019-08-12 | Discharge: 2019-08-12 | Disposition: A | Discharge status: Home / Self Care | Payer: Medicaid Other | Attending: Emergency Medicine | Admitting: Emergency Medicine

## 2019-08-12 ENCOUNTER — Other Ambulatory Visit: Payer: Self-pay

## 2019-08-12 DIAGNOSIS — R0989 Other specified symptoms and signs involving the circulatory and respiratory systems: Secondary | ICD-10-CM | POA: Diagnosis not present

## 2019-08-12 MED ORDER — CETIRIZINE HCL 10 MG PO TABS: 10.0000 mg | ORAL_TABLET | Freq: Every day | ORAL | 0 refills | Status: DC

## 2019-08-12 MED ORDER — FLUTICASONE PROPIONATE 50 MCG/ACT NA SUSP
1.0000 | Freq: Every day | NASAL | 0 refills | Status: DC
Start: 2019-08-12 — End: 2019-10-13

## 2019-08-12 NOTE — ED Triage Notes (Signed)
Pt c/o headache, sore throat and runny nose x 3 days, no fever at home, denies sick exposure. Not COVID vaccinated

## 2019-08-12 NOTE — Discharge Instructions (Signed)
Your COVID test is pending - it is important to quarantine / isolate at home until your results are back. °If you test positive and would like further evaluation for persistent or worsening symptoms, you may schedule an E-visit or virtual (video) visit throughout the [facility] MyChart app or website. ° °PLEASE NOTE: If you develop severe chest pain or shortness of breath please go to the ER or call 9-1-1 for further evaluation --> DO NOT schedule electronic or virtual visits for this. °Please call our office for further guidance / recommendations as needed. ° °For information about the Covid vaccine, please visit [email or web address] °

## 2019-08-12 NOTE — ED Provider Notes (Signed)
[name]    CSN: [id number] Arrival date & time: 08/12/19  4742      History   Chief Complaint Chief Complaint  Patient presents with  . Headache  . Nasal Congestion    HPI Regina Warren is a 24 y.o. female with history of headaches presenting for generalized headache, sore throat, rhinorrhea x3 days.  Denies fever, sinus pressure congestion, change in vision or hearing, dizziness, cough, shortness of breath, chest pain.  Does not currently have a headache.  Has taken Aleve with relief.  Has not received Covid vaccines.  Denies known sick exposures.  Has cleaned and moved recently.   Past Medical History:  Diagnosis Date  . Abdominal pain   . Anxiety   . Discomfort of back 03/2019  . Vitamin D deficiency 06/2018    Patient Active Problem List   Diagnosis Date Noted  . Runny nose 08/12/2019  . Discomfort of back 04/02/2019  . Intractable headache 07/25/2018  . Right lower quadrant abdominal pain 05/21/2018  . Urinary tract infection without hematuria 05/21/2018  . Pelvic pain 03/30/2018  . Vaginal discharge 03/30/2018  . Elevated BP 03/14/2014    Past Surgical History:  Procedure Laterality Date  . lung puncture      OB History    Gravida  1   Para      Term      Preterm      AB      Living  1     SAB      TAB      Ectopic      Multiple      Live Births               Home Medications    Prior to Admission medications   Medication Sig Start Date End Date Taking? Authorizing Provider  naproxen sodium (ALEVE) 220 MG tablet Take 220 mg by mouth.    [provider]  Vitamin D, Ergocalciferol, (DRISDOL) 1.25 MG (50000 UT) CAPS capsule Take 1 capsule (50,000 Units total) by mouth every 7 (seven) days. 02/01/19   Kallie Locks, FNP    Family History Family History  Problem Relation Age of Onset  . Mental illness Neg Hx     Social History Social History   Tobacco Use  . Smoking status: Never  Smoker  . Smokeless tobacco: Never Used  Vaping Use  . Vaping Use: Never used  Substance Use Topics  . Alcohol use: Yes    Comment: occasionally  . Drug use: Never     Allergies   Imitrex [sumatriptan]   Review of Systems As per HPI   Physical Exam Triage Vital Signs ED Triage Vitals [08/12/19 0850]  Enc Vitals Group     BP 131/81     Pulse Rate 76     Resp 18     Temp 98 F (36.7 C)     Temp src      SpO2 98 %     Weight      Height      Head Circumference      Peak Flow      Pain Score      Pain Loc      Pain Edu?      Excl. in GC?    No data found.  Updated Vital Signs BP 131/81   Pulse 76   Temp 98 F (36.7 C)   Resp 18   LMP 08/04/2019 (Exact  Date)   SpO2 98%   Visual Acuity Right Eye Distance:   Left Eye Distance:   Bilateral Distance:    Right Eye Near:   Left Eye Near:    Bilateral Near:     Physical Exam Constitutional:      General: She is not in acute distress.    Appearance: She is obese. She is not ill-appearing or diaphoretic.  HENT:     Head: Normocephalic and atraumatic.     Right Ear: Tympanic membrane, ear canal and external ear normal.     Left Ear: Tympanic membrane, ear canal and external ear normal.     Nose:     Comments: Bilateral turbinate edema with mucosal pallor    Mouth/Throat:     Mouth: Mucous membranes are moist.     Pharynx: Oropharynx is clear. No oropharyngeal exudate or posterior oropharyngeal erythema.  Eyes:     General: No scleral icterus.    Conjunctiva/sclera: Conjunctivae normal.     Pupils: Pupils are equal, round, and reactive to light.  Neck:     Comments: Trachea midline, negative JVD Cardiovascular:     Rate and Rhythm: Normal rate and regular rhythm.     Heart sounds: No murmur heard.  No gallop.   Pulmonary:     Effort: Pulmonary effort is normal. No respiratory distress.     Breath sounds: No wheezing, rhonchi or rales.  Musculoskeletal:     Cervical back: Neck supple. No  tenderness.  Lymphadenopathy:     Cervical: No cervical adenopathy.  Skin:    Capillary Refill: Capillary refill takes less than 2 seconds.     Coloration: Skin is not jaundiced or pale.     Findings: No rash.  Neurological:     General: No focal deficit present.     Mental Status: She is alert and oriented to person, place, and time.      UC Treatments / Results  Labs (all labs ordered are listed, but only abnormal results are displayed) Labs Reviewed  NOVEL CORONAVIRUS, NAA    EKG   Radiology No results found.  Procedures Procedures (including critical care time)  Medications Ordered in UC Medications - No data to display  Initial Impression / Assessment and Plan / UC Course  I have reviewed the triage vital signs and the nursing notes.  Pertinent labs & imaging results that were available during my care of the patient were reviewed by me and considered in my medical decision making (see chart for details).     Patient afebrile, nontoxic, with SpO2 98%.  Likely allergy VS airway irritation 2/2 cleaning products and moving VS viral infection.  Covid PCR pending.  Patient to quarantine until results are back.  We will treat supportively as outlined below.  Return precautions discussed, patient verbalized understanding and is agreeable to plan. Final Clinical Impressions(s) / UC Diagnoses   Final diagnoses:  Runny nose   Discharge Instructions   None    ED Prescriptions    None     PDMP not reviewed this encounter.   Hall-Potvin, [place], [place] 08/12/19 0940

## 2019-08-13 LAB — SARS-COV-2, NAA 2 DAY TAT

## 2019-08-13 LAB — NOVEL CORONAVIRUS, NAA: SARS-CoV-2, NAA: NOT DETECTED

## 2019-08-25 ENCOUNTER — Other Ambulatory Visit: Payer: Self-pay

## 2019-08-25 ENCOUNTER — Encounter: Payer: Self-pay | Admitting: Emergency Medicine

## 2019-08-25 ENCOUNTER — Ambulatory Visit: Admit: 2019-08-25 | Disposition: A | Discharge status: Home / Self Care | Payer: Medicaid Other

## 2019-08-25 ENCOUNTER — Ambulatory Visit
Admission: EM | Admit: 2019-08-25 | Discharge: 2019-08-25 | Disposition: A | Discharge status: Home / Self Care | Payer: Medicaid Other | Attending: Physician Assistant | Admitting: Physician Assistant

## 2019-08-25 DIAGNOSIS — M546 Pain in thoracic spine: Secondary | ICD-10-CM | POA: Diagnosis not present

## 2019-08-25 DIAGNOSIS — N898 Other specified noninflammatory disorders of vagina: Secondary | ICD-10-CM

## 2019-08-25 DIAGNOSIS — R350 Frequency of micturition: Secondary | ICD-10-CM | POA: Diagnosis not present

## 2019-08-25 LAB — POCT URINALYSIS DIP (MANUAL ENTRY)
Bilirubin, UA: NEGATIVE
Blood, UA: NEGATIVE
Glucose, UA: NEGATIVE mg/dL
Leukocytes, UA: NEGATIVE
Nitrite, UA: NEGATIVE
Protein Ur, POC: NEGATIVE mg/dL
Spec Grav, UA: >=1.03 — AB (ref 1.010–1.025)
Urobilinogen, UA: 1 E.U./dL
pH, UA: 6 (ref 5.0–8.0)

## 2019-08-25 LAB — POCT URINE PREGNANCY: Preg Test, Ur: NEGATIVE

## 2019-08-25 NOTE — ED Triage Notes (Signed)
Pt presents to [facility] for assessment of left sided flank pain, with some occasional right sided flank pain, vaginal discharge and frequent urination x 5 days.  Patient would like STD Testing.

## 2019-08-25 NOTE — ED Provider Notes (Signed)
[name]    CSN: [id number] Arrival date & time: 08/25/19  1316      History   Chief Complaint Chief Complaint  Patient presents with  . Back Pain  . Dysuria  . Vaginal Discharge    HPI Regina Warren is a 24 y.o. female.   24 year old female comes in for 5 day history of left flank pain, vaginal discharge, urinary frequency. Also with occasional right sided flank pain. Vaginal discharge described as thick, denies itching, odor. Urinary frequency without dysuria, heamturia. Occasional urgency. Low abdominal cramping, no nausea/vomiting. No fevers. Left flank pain that is constant that can radiate to the right at times. Worse with movement at times. No association with urinary symptoms. Sexually active ,1 female partner. No birth control use. LMP 08/04/2019     Past Medical History:  Diagnosis Date  . Abdominal pain   . Anxiety   . Discomfort of back 03/2019  . Vitamin D deficiency 06/2018    Patient Active Problem List   Diagnosis Date Noted  . Runny nose 08/12/2019  . Discomfort of back 04/02/2019  . Intractable headache 07/25/2018  . Right lower quadrant abdominal pain 05/21/2018  . Urinary tract infection without hematuria 05/21/2018  . Pelvic pain 03/30/2018  . Vaginal discharge 03/30/2018  . Elevated BP 03/14/2014    Past Surgical History:  Procedure Laterality Date  . lung puncture      OB History    Gravida  1   Para      Term      Preterm      AB      Living  1     SAB      TAB      Ectopic      Multiple      Live Births               Home Medications    Prior to Admission medications   Medication Sig Start Date End Date Taking? Authorizing Provider  cetirizine (ZYRTEC ALLERGY) 10 MG tablet Take 1 tablet (10 mg total) by mouth daily. 08/12/19   Hall-Potvin, [place], PA-C  fluticasone (FLONASE) 50 MCG/ACT nasal spray Place 1 spray into both nostrils daily. 08/12/19   Hall-Potvin, [place], PA-C  naproxen  sodium (ALEVE) 220 MG tablet Take 220 mg by mouth.    [provider]  Vitamin D, Ergocalciferol, (DRISDOL) 1.25 MG (50000 UT) CAPS capsule Take 1 capsule (50,000 Units total) by mouth every 7 (seven) days. 02/01/19   Kallie Locks, FNP    Family History Family History  Problem Relation Age of Onset  . Mental illness Neg Hx     Social History Social History   Tobacco Use  . Smoking status: Never Smoker  . Smokeless tobacco: Never Used  Vaping Use  . Vaping Use: Never used  Substance Use Topics  . Alcohol use: Yes    Comment: occasionally  . Drug use: Never     Allergies   Imitrex [sumatriptan]   Review of Systems Review of Systems  Reason unable to perform ROS: See HPI as above.     Physical Exam Triage Vital Signs ED Triage Vitals [08/25/19 1405]  Enc Vitals Group     BP (!) 124/88     Pulse Rate 70     Resp 18     Temp 98.9 F (37.2 C)     Temp Source Oral     SpO2 97 %  Weight      Height      Head Circumference      Peak Flow      Pain Score 6     Pain Loc      Pain Edu?      Excl. in GC?    No data found.  Updated Vital Signs BP (!) 124/88 (BP Location: Left Arm)   Pulse 70   Temp 98.9 F (37.2 C) (Oral)   Resp 18   LMP 08/04/2019 (Exact Date)   SpO2 97%   Physical Exam Constitutional:      General: She is not in acute distress.    Appearance: She is well-developed. She is not ill-appearing, toxic-appearing or diaphoretic.  HENT:     Head: Normocephalic and atraumatic.  Eyes:     Conjunctiva/sclera: Conjunctivae normal.     Pupils: Pupils are equal, round, and reactive to light.  Cardiovascular:     Rate and Rhythm: Normal rate and regular rhythm.  Pulmonary:     Effort: Pulmonary effort is normal. No respiratory distress.     Comments: LCTAB Abdominal:     General: Bowel sounds are normal.     Palpations: Abdomen is soft.     Tenderness: There is no abdominal tenderness. There is no guarding or rebound.    Musculoskeletal:     Cervical back: Normal range of motion and neck supple.     Comments: Points to thoracic back for pain. Not reproducible. Full ROM of back. Negative CVA tenderness  Skin:    General: Skin is warm and dry.  Neurological:     Mental Status: She is alert and oriented to person, place, and time.  Psychiatric:        Behavior: Behavior normal.        Judgment: Judgment normal.      UC Treatments / Results  Labs (all labs ordered are listed, but only abnormal results are displayed) Labs Reviewed  POCT URINALYSIS DIP (MANUAL ENTRY) - Abnormal; Notable for the following components:      Result Value   Ketones, POC UA trace (5) (*)    Spec Grav, UA >=1.030 (*)    All other components within normal limits  POCT URINE PREGNANCY - Normal  CERVICOVAGINAL ANCILLARY ONLY    EKG   Radiology No results found.  Procedures Procedures (including critical care time)  Medications Ordered in UC Medications - No data to display  Initial Impression / Assessment and Plan / UC Course  I have reviewed the triage vital signs and the nursing notes.  Pertinent labs & imaging results that were available during my care of the patient were reviewed by me and considered in my medical decision making (see chart for details).    1. Urinary frequency Dipstick negative for leuks/nitrites. Increased specific gravity. Push fluids. Return precautions given.  2. Vaginal discharge No changes in hygiene product. No vaginal itching, odor, will await cytology prior to treatment. Cytology sent, patient will be contacted with any positive results that require additional treatment. Return precautions given.   3. Thoracic back pain Not reproducible on exam. LCTAB. Can trial NSAIDs. Follow up with PCP if symptoms not improving. Return precautions given.  Final Clinical Impressions(s) / UC Diagnoses   Final diagnoses:  Urinary frequency  Vaginal discharge  Acute bilateral thoracic back  pain    ED Prescriptions    None     PDMP not reviewed this encounter.   Belinda Fisher, PA-C 08/25/19 1441

## 2019-08-25 NOTE — Discharge Instructions (Signed)
Urinary frequency Urine negative for infection, did show that you are dehydrated. Keep hydrated, urine should be clear to pale yellow in color. Decrease any caffeine/alcohol that could be causing symptoms. Follow up with PCP if not improving  Vaginal discharge Cytology sent, you will be contacted with any positive results that requires further treatment. Monitor for any worsening of symptoms, fever, abdominal pain, nausea, vomiting, to follow up for reevaluation.  Thoracic back pain No alarming signs at this time. Does not appear to be related to the symptoms above. Ibuprofen 800mg  three times a day if needed for pain. Otherwise, follow up with PCP if symptoms not improving. If sudden shortness of breath, go to the ED for further evaluation.

## 2019-08-26 ENCOUNTER — Telehealth (HOSPITAL_COMMUNITY): Payer: Self-pay

## 2019-08-26 LAB — CERVICOVAGINAL ANCILLARY ONLY
Bacterial Vaginitis (gardnerella): POSITIVE — AB
Candida Glabrata: NEGATIVE
Candida Vaginitis: NEGATIVE
Chlamydia: NEGATIVE
Comment: NEGATIVE
Comment: NEGATIVE
Comment: NEGATIVE
Comment: NEGATIVE
Comment: NEGATIVE
Comment: NORMAL
Neisseria Gonorrhea: NEGATIVE
Trichomonas: NEGATIVE

## 2019-08-26 MED ORDER — METRONIDAZOLE 500 MG PO TABS: 500.0000 mg | ORAL_TABLET | Freq: Two times a day (BID) | ORAL | 0 refills | Status: DC

## 2019-09-14 ENCOUNTER — Other Ambulatory Visit: Payer: Self-pay

## 2019-09-14 ENCOUNTER — Ambulatory Visit (HOSPITAL_COMMUNITY)
Admission: RE | Admit: 2019-09-14 | Discharge: 2019-09-14 | Disposition: A | Discharge status: Home / Self Care | Payer: Medicaid Other | Source: Ambulatory Visit | Attending: Family Medicine | Admitting: Family Medicine

## 2019-09-14 ENCOUNTER — Ambulatory Visit: Payer: Medicaid Other | Admitting: Family Medicine

## 2019-09-14 ENCOUNTER — Encounter: Payer: Self-pay | Admitting: Family Medicine

## 2019-09-14 VITALS — BP 123/83 | HR 62 | Temp 98.6°F | Ht 64.0 in | Wt 158.2 lb

## 2019-09-14 DIAGNOSIS — Z09 Encounter for follow-up examination after completed treatment for conditions other than malignant neoplasm: Secondary | ICD-10-CM | POA: Diagnosis not present

## 2019-09-14 DIAGNOSIS — M546 Pain in thoracic spine: Secondary | ICD-10-CM | POA: Diagnosis not present

## 2019-09-14 DIAGNOSIS — F419 Anxiety disorder, unspecified: Secondary | ICD-10-CM

## 2019-09-14 DIAGNOSIS — Z0000 Encounter for general adult medical examination without abnormal findings: Secondary | ICD-10-CM

## 2019-09-14 DIAGNOSIS — M549 Dorsalgia, unspecified: Secondary | ICD-10-CM | POA: Insufficient documentation

## 2019-09-14 DIAGNOSIS — M542 Cervicalgia: Secondary | ICD-10-CM | POA: Diagnosis not present

## 2019-09-14 LAB — POCT URINALYSIS DIPSTICK
Bilirubin, UA: NEGATIVE
Blood, UA: NEGATIVE
Glucose, UA: NEGATIVE
Ketones, UA: NEGATIVE
Leukocytes, UA: NEGATIVE
Nitrite, UA: NEGATIVE
Protein, UA: NEGATIVE
Spec Grav, UA: 1.02 (ref 1.010–1.025)
Urobilinogen, UA: 1 E.U./dL
pH, UA: 7.5 (ref 5.0–8.0)

## 2019-09-14 NOTE — Progress Notes (Signed)
[facility]    Established Patient Office Visit  Subjective:  Patient ID: Regina Warren, female    DOB: 16-Oct-1995  Age: 24 y.o. MRN: [id number]  CC:  Chief Complaint  Patient presents with  . Back Pain    X 49yr.    HPI Regina Warren is a 24 year old female who presents for Follow Up today.    Patient Active Problem List   Diagnosis Date Noted  . Runny nose 08/12/2019  . Discomfort of back 04/02/2019  . Intractable headache 07/25/2018  . Right lower quadrant abdominal pain 05/21/2018  . Urinary tract infection without hematuria 05/21/2018  . Pelvic pain 03/30/2018  . Vaginal discharge 03/30/2018  . Elevated BP 03/14/2014    Past Medical History:  Diagnosis Date  . Abdominal pain   . Anxiety   . Discomfort of back 03/2019  . Vitamin D deficiency 06/2018    Past Surgical History:  Procedure Laterality Date  . lung puncture     Patient Active Problem List   Diagnosis Date Noted  . Runny nose 08/12/2019  . Discomfort of back 04/02/2019  . Intractable headache 07/25/2018  . Right lower quadrant abdominal pain 05/21/2018  . Urinary tract infection without hematuria 05/21/2018  . Pelvic pain 03/30/2018  . Vaginal discharge 03/30/2018  . Elevated BP 03/14/2014   Family History  Problem Relation Age of Onset  . Mental illness Neg Hx    Current Status: Since her last office visit, she continues to have c/o chronic back pain. She denies fevers, chills, fatigue, recent infections, weight loss, and night sweats. She has not had any visual changes, dizziness, and falls. No chest pain, heart palpitations, cough and shortness of breath reported. Denies GI problems such as nausea, vomiting, diarrhea, and constipation. She has no reports of blood in stools, dysuria and hematuria. No depression or anxiety reported today, as she has felt much better since resigning from her previous job. She is currently  placing applications for future employment. She is taking all medications as prescribed.    Social History   Socioeconomic History  . Marital status: Single    Spouse name: Not on file  . Number of children: Not on file  . Years of education: Not on file  . Highest education level: Not on file  Occupational History  . Not on file  Tobacco Use  . Smoking status: Never Smoker  . Smokeless tobacco: Never Used  Vaping Use  . Vaping Use: Never used  Substance and Sexual Activity  . Alcohol use: Yes    Comment: occasionally  . Drug use: Never  . Sexual activity: Yes    Birth control/protection: None  Other Topics Concern  . Not on file  Social History Narrative   ** Merged History Encounter **       Social Determinants of Health   Financial Resource Strain:   . Difficulty of Paying Living Expenses: Not on file  Food Insecurity:   . Worried About Programme researcher, broadcasting/film/video in the Last Year: Not on file  . Ran Out of Food in the Last Year: Not on file  Transportation Needs:   . Lack of Transportation (Medical): Not on file  . Lack of Transportation (Non-Medical): Not on file  Physical Activity:   . Days of Exercise per Week: Not on file  . Minutes of Exercise per Session: Not on file  Stress:   . Feeling of Stress :  Not on file  Social Connections:   . Frequency of Communication with Friends and Family: Not on file  . Frequency of Social Gatherings with Friends and Family: Not on file  . Attends Religious Services: Not on file  . Active Member of Clubs or Organizations: Not on file  . Attends Banker Meetings: Not on file  . Marital Status: Not on file  Intimate Partner Violence:   . Fear of Current or Ex-Partner: Not on file  . Emotionally Abused: Not on file  . Physically Abused: Not on file  . Sexually Abused: Not on file    Outpatient Medications Prior to Visit  Medication Sig Dispense Refill  . naproxen sodium (ALEVE) 220 MG tablet Take 220 mg by mouth.     . Vitamin D, Ergocalciferol, (DRISDOL) 1.25 MG (50000 UT) CAPS capsule Take 1 capsule (50,000 Units total) by mouth every 7 (seven) days. 5 capsule 6  . cetirizine (ZYRTEC ALLERGY) 10 MG tablet Take 1 tablet (10 mg total) by mouth daily. (Patient not taking: Reported on 09/14/2019) 30 tablet 0  . fluticasone (FLONASE) 50 MCG/ACT nasal spray Place 1 spray into both nostrils daily. (Patient not taking: Reported on 09/14/2019) 16 g 0  . metroNIDAZOLE (FLAGYL) 500 MG tablet Take 1 tablet (500 mg total) by mouth 2 (two) times daily. (Patient not taking: Reported on 09/14/2019) 14 tablet 0   No facility-administered medications prior to visit.    Allergies  Allergen Reactions  . Imitrex [Sumatriptan]     "made my brain feel like it was about to explode"    ROS Review of Systems  Constitutional: Negative.   HENT: Negative.   Eyes: Negative.   Respiratory: Negative.   Cardiovascular: Negative.   Gastrointestinal: Negative.   Endocrine: Negative.   Genitourinary: Negative.   Musculoskeletal: Positive for back pain (occasional ).  Skin: Negative.   Allergic/Immunologic: Negative.   Neurological: Negative.   Hematological: Negative.   Psychiatric/Behavioral: Negative.       Objective:    Physical Exam Vitals and nursing note reviewed.  Constitutional:      Appearance: Normal appearance.  HENT:     Head: Normocephalic and atraumatic.     Nose: Nose normal.     Mouth/Throat:     Mouth: Mucous membranes are moist.     Pharynx: Oropharynx is clear.  Cardiovascular:     Rate and Rhythm: Normal rate and regular rhythm.     Pulses: Normal pulses.     Heart sounds: Normal heart sounds.  Pulmonary:     Effort: Pulmonary effort is normal.     Breath sounds: Normal breath sounds.  Abdominal:     General: Bowel sounds are normal.     Palpations: Abdomen is soft.  Musculoskeletal:        General: Normal range of motion.     Cervical back: Normal range of motion and neck supple.    Skin:    General: Skin is warm.  Neurological:     General: No focal deficit present.     Mental Status: She is alert and oriented to person, place, and time.  Psychiatric:        Mood and Affect: Mood normal.        Behavior: Behavior normal.        Thought Content: Thought content normal.        Judgment: Judgment normal.    BP 123/83 (BP Location: Left Arm, Patient Position: Sitting, Cuff Size: Normal)  Pulse 62   Temp 98.6 F (37 C)   Ht 5\' 4"  (1.626 m)   Wt 158 lb 3.2 oz (71.8 kg)   LMP 09/04/2019 (Exact Date)   SpO2 100%   BMI 27.15 kg/m  Wt Readings from Last 3 Encounters:  09/14/19 158 lb 3.2 oz (71.8 kg)  07/25/19 160 lb (72.6 kg)  04/13/19 161 lb 6.4 oz (73.2 kg)     Health Maintenance Due  Topic Date Due  . PAP-Cervical Cytology Screening  05/10/2019  . INFLUENZA VACCINE  08/28/2019    There are no preventive care reminders to display for this patient.  Lab Results  Component Value Date   TSH 2.39 05/07/2016   Lab Results  Component Value Date   WBC 3.6 07/25/2019   HGB 12.7 07/25/2019   HCT 38.3 07/25/2019   MCV 96 07/25/2019   PLT 242 07/25/2019   Lab Results  Component Value Date   NA 138 07/25/2019   K 4.2 07/25/2019   CO2 21 07/25/2019   GLUCOSE 90 07/25/2019   BUN 9 07/25/2019   CREATININE 0.70 07/25/2019   BILITOT 0.3 07/25/2019   ALKPHOS 61 07/25/2019   AST 17 07/25/2019   ALT 13 07/25/2019   PROT 6.7 07/25/2019   ALBUMIN 4.1 07/25/2019   CALCIUM 9.1 07/25/2019   ANIONGAP 9 10/07/2017   No results found for: CHOL No results found for: HDL No results found for: LDLCALC No results found for: TRIG No results found for: [facility] Lab Results  Component Value Date   HGBA1C 5.0 04/13/2019      Assessment & Plan:   1. Back pain, unspecified back location, unspecified back pain laterality, unspecified chronicity She will complete orders previously placed for scans today.  2. Anxiety Stable today.   3. Healthcare  maintenance - Vitamin D, 25-hydroxy - Vitamin B12 - Urinalysis Dipstick  4. Follow up She will follow up a needed.   No orders of the defined types were placed in this encounter.   Orders Placed This Encounter  Procedures  . Vitamin D, 25-hydroxy  . Vitamin B12  . Urinalysis Dipstick   Referral Orders  No referral(s) requested today   04/15/2019,  MSN, FNP-BC [facility] [address] [place], [place] [phone]- fax  Problem List Items Addressed This Visit      Other   Discomfort of back - Primary    Other Visit Diagnoses    Anxiety       Healthcare maintenance       Relevant Orders   Vitamin D, 25-hydroxy (Completed)   Vitamin B12 (Completed)   Urinalysis Dipstick (Completed)   Follow up          No orders of the defined types were placed in this encounter.   Follow-up: No follow-ups on file.    [phone], FNP

## 2019-09-15 LAB — VITAMIN D 25 HYDROXY (VIT D DEFICIENCY, FRACTURES): Vit D, 25-Hydroxy: 25.3 ng/mL — ABNORMAL LOW (ref 30.0–100.0)

## 2019-09-15 LAB — VITAMIN B12: Vitamin B-12: 373 pg/mL (ref 232–1245)

## 2019-09-18 ENCOUNTER — Encounter: Payer: Self-pay | Admitting: Family Medicine

## 2019-09-21 NOTE — Progress Notes (Signed)
Pt was called to discuss her  X-ray. Pt stated she understood and asked about her flu injection. I let pt know she can come in t get her flu injection here towards the end of September.

## 2019-09-23 ENCOUNTER — Encounter: Payer: Self-pay | Admitting: Family Medicine

## 2019-10-04 ENCOUNTER — Ambulatory Visit: Payer: Medicaid Other

## 2019-10-04 DIAGNOSIS — Z0000 Encounter for general adult medical examination without abnormal findings: Secondary | ICD-10-CM

## 2019-10-13 ENCOUNTER — Encounter: Payer: Self-pay | Admitting: Emergency Medicine

## 2019-10-13 ENCOUNTER — Other Ambulatory Visit: Payer: Self-pay

## 2019-10-13 ENCOUNTER — Ambulatory Visit
Admission: EM | Admit: 2019-10-13 | Discharge: 2019-10-13 | Disposition: A | Discharge status: Home / Self Care | Payer: Medicaid Other | Attending: Emergency Medicine | Admitting: Emergency Medicine

## 2019-10-13 DIAGNOSIS — S46812A Strain of other muscles, fascia and tendons at shoulder and upper arm level, left arm, initial encounter: Secondary | ICD-10-CM

## 2019-10-13 DIAGNOSIS — R05 Cough: Secondary | ICD-10-CM

## 2019-10-13 DIAGNOSIS — Z20822 Contact with and (suspected) exposure to covid-19: Secondary | ICD-10-CM | POA: Diagnosis not present

## 2019-10-13 DIAGNOSIS — R059 Cough, unspecified: Secondary | ICD-10-CM

## 2019-10-13 MED ORDER — CETIRIZINE HCL 10 MG PO TABS
10.0000 mg | ORAL_TABLET | Freq: Every day | ORAL | 0 refills | Status: DC
Start: 2019-10-13 — End: 2019-11-24

## 2019-10-13 MED ORDER — FLUTICASONE PROPIONATE 50 MCG/ACT NA SUSP
1.0000 | Freq: Every day | NASAL | 0 refills | Status: DC
Start: 2019-10-13 — End: 2019-11-24

## 2019-10-13 MED ORDER — OMEPRAZOLE 20 MG PO CPDR
20.0000 mg | DELAYED_RELEASE_CAPSULE | Freq: Every day | ORAL | 0 refills | Status: DC
Start: 2019-10-13 — End: 2019-11-24

## 2019-10-13 MED ORDER — CYCLOBENZAPRINE HCL 5 MG PO TABS: 5.0000 mg | ORAL_TABLET | Freq: Two times a day (BID) | ORAL | 0 refills | Status: AC | PRN

## 2019-10-13 NOTE — ED Provider Notes (Signed)
[name]    CSN: [id number] Arrival date & time: 10/13/19  1252      History   Chief Complaint Chief Complaint  Patient presents with  . Neck Pain  . Back Pain    HPI Radonna Bracher is a 24 y.o. female  Presenting for multiple concerns. Noting left-sided neck and upper back pain since this morning.  Unsure if she slept on it wrong.  No trauma, injury, change in work/activity level.  Is right-hand dominant.  No numbness, deformity, neck pain/stiffness, fever.  Has not taken thing for this. Patient also notes 3-day course of dry cough.  States it is worse at night when reclining.  Does have brief, intermittent sharp retrosternal/epigastric pains that are nonradiating, without dyspnea, palpitations, lightheadedness, nausea or vomiting.  No personal or family cardiopulmonary history.  Has not taken thing for this.  States her sister has history of GERD, though she is unsure if she does herself.  Past Medical History:  Diagnosis Date  . Abdominal pain   . Anxiety   . Discomfort of back 03/2019  . Vitamin D deficiency 06/2018    Patient Active Problem List   Diagnosis Date Noted  . Runny nose 08/12/2019  . Discomfort of back 04/02/2019  . Intractable headache 07/25/2018  . Right lower quadrant abdominal pain 05/21/2018  . Urinary tract infection without hematuria 05/21/2018  . Pelvic pain 03/30/2018  . Vaginal discharge 03/30/2018  . Elevated BP 03/14/2014    Past Surgical History:  Procedure Laterality Date  . lung puncture      OB History    Gravida  1   Para      Term      Preterm      AB      Living  1     SAB      TAB      Ectopic      Multiple      Live Births               Home Medications    Prior to Admission medications   Medication Sig Start Date End Date Taking? Authorizing Provider  Vitamin D, Ergocalciferol, (DRISDOL) 1.25 MG (50000 UT) CAPS capsule Take 1 capsule (50,000 Units total) by mouth every 7  (seven) days. 02/01/19  Yes Kallie Locks, FNP  cetirizine (ZYRTEC ALLERGY) 10 MG tablet Take 1 tablet (10 mg total) by mouth daily. 10/13/19   Hall-Potvin, [place], PA-C  cyclobenzaprine (FLEXERIL) 5 MG tablet Take 1 tablet (5 mg total) by mouth 2 (two) times daily as needed for up to 7 days for muscle spasms. 10/13/19 10/20/19  Hall-Potvin, [place], PA-C  fluticasone (FLONASE) 50 MCG/ACT nasal spray Place 1 spray into both nostrils daily. 10/13/19   Hall-Potvin, [place], PA-C  omeprazole (PRILOSEC) 20 MG capsule Take 1 capsule (20 mg total) by mouth daily. 10/13/19   Hall-Potvin, [place], PA-C    Family History Family History  Problem Relation Age of Onset  . Mental illness Neg Hx     Social History Social History   Tobacco Use  . Smoking status: Never Smoker  . Smokeless tobacco: Never Used  Vaping Use  . Vaping Use: Never used  Substance Use Topics  . Alcohol use: Yes    Comment: occasionally  . Drug use: Never     Allergies   Imitrex [sumatriptan]   Review of Systems As per HPI   Physical Exam Triage Vital Signs ED Triage Vitals  Enc  Vitals Group     BP 10/13/19 1319 (!) 146/91     Pulse Rate 10/13/19 1319 68     Resp 10/13/19 1319 16     Temp 10/13/19 1319 98.4 F (36.9 C)     Temp Source 10/13/19 1319 Oral     SpO2 10/13/19 1319 97 %     Weight 10/13/19 1331 160 lb (72.6 kg)     Height 10/13/19 1331 5\' 4"  (1.626 m)     Head Circumference --      Peak Flow --      Pain Score 10/13/19 1330 4     Pain Loc --      Pain Edu? --      Excl. in GC? --    No data found.  Updated Vital Signs BP (!) 146/91 (BP Location: Right Arm)   Pulse 68   Temp 98.4 F (36.9 C) (Oral)   Resp 16   Ht 5\' 4"  (1.626 m)   Wt 160 lb (72.6 kg)   LMP 10/05/2019   SpO2 97%   BMI 27.46 kg/m   Visual Acuity Right Eye Distance:   Left Eye Distance:   Bilateral Distance:    Right Eye Near:   Left Eye Near:    Bilateral Near:     Physical Exam Constitutional:       General: She is not in acute distress.    Appearance: She is obese. She is not ill-appearing or diaphoretic.  HENT:     Head: Normocephalic and atraumatic.     Mouth/Throat:     Mouth: Mucous membranes are moist.     Pharynx: Oropharynx is clear. No oropharyngeal exudate or posterior oropharyngeal erythema.  Eyes:     General: No scleral icterus.    Conjunctiva/sclera: Conjunctivae normal.     Pupils: Pupils are equal, round, and reactive to light.  Neck:     Comments: Trachea midline, negative JVD Cardiovascular:     Rate and Rhythm: Normal rate and regular rhythm.     Heart sounds: No murmur heard.  No gallop.   Pulmonary:     Effort: Pulmonary effort is normal. No respiratory distress.     Breath sounds: No wheezing, rhonchi or rales.  Abdominal:     Palpations: Abdomen is soft.     Tenderness: There is no abdominal tenderness.  Musculoskeletal:     Cervical back: Neck supple. No tenderness.     Comments: No spinous process tenderness of cervical spine, thoracic spine.  Scapula without bony deformity or tenderness.  Left trapezius mildly edematous and tender as compared to right.  No spasm appreciated.  Neurovascular intact  Lymphadenopathy:     Cervical: No cervical adenopathy.  Skin:    Capillary Refill: Capillary refill takes less than 2 seconds.     Coloration: Skin is not jaundiced or pale.     Findings: No rash.  Neurological:     General: No focal deficit present.     Mental Status: She is alert and oriented to person, place, and time.      UC Treatments / Results  Labs (all labs ordered are listed, but only abnormal results are displayed) Labs Reviewed  NOVEL CORONAVIRUS, NAA    EKG   Radiology No results found.  Procedures Procedures (including critical care time)  Medications Ordered in UC Medications - No data to display  Initial Impression / Assessment and Plan / UC Course  I have reviewed the triage vital signs and the nursing  notes.   Pertinent labs & imaging results that were available during my care of the patient were reviewed by me and considered in my medical decision making (see chart for details).     Patient afebrile, nontoxic, with SpO2 97%.  Patient requesting Covid PCR testing given cough, line of work: Pending.  Patient to quarantine until results are back.  Discussed allergy/PND VS GERD VS viral illness.  We will treat supportively as outlined below, keep track of symptoms, and follow-up with PCP.  Return precautions discussed, patient verbalized understanding and is agreeable to plan. Final Clinical Impressions(s) / UC Diagnoses   Final diagnoses:  Encounter for screening laboratory testing for COVID-19 virus  Trapezius muscle strain, left, initial encounter  Cough     Discharge Instructions     Your COVID test is pending - it is important to quarantine / isolate at home until your results are back. If you test positive and would like further evaluation for persistent or worsening symptoms, you may schedule an E-visit or virtual (video) visit throughout the [facility] app or website.  PLEASE NOTE: If you develop severe chest pain or shortness of breath please go to the ER or call 9-1-1 for further evaluation --> DO NOT schedule electronic or virtual visits for this. Please call our office for further guidance / recommendations as needed.  For information about the Covid vaccine, please visit [email or web address]    ED Prescriptions    Medication Sig Dispense Auth. Provider   cetirizine (ZYRTEC ALLERGY) 10 MG tablet Take 1 tablet (10 mg total) by mouth daily. 30 tablet Hall-Potvin, [place], PA-C   fluticasone (FLONASE) 50 MCG/ACT nasal spray Place 1 spray into both nostrils daily. 16 g Hall-Potvin, [place], PA-C   omeprazole (PRILOSEC) 20 MG capsule Take 1 capsule (20 mg total) by mouth daily. 30 capsule Hall-Potvin, [place], PA-C   cyclobenzaprine (FLEXERIL) 5 MG tablet Take 1 tablet (5  mg total) by mouth 2 (two) times daily as needed for up to 7 days for muscle spasms. 14 tablet Hall-Potvin, [place], PA-C     PDMP not reviewed this encounter.   Hall-Potvin, [place], [place] 10/13/19 1435

## 2019-10-13 NOTE — ED Triage Notes (Addendum)
Patient c/o neck and upper back pain that started this morning. She denies injury. Patient states when she lies flat she feels like she needs to cough. Patient is also requesting to be tested for COVID.

## 2019-10-13 NOTE — Discharge Instructions (Signed)
Your COVID test is pending - it is important to quarantine / isolate at home until your results are back. °If you test positive and would like further evaluation for persistent or worsening symptoms, you may schedule an E-visit or virtual (video) visit throughout the [facility] MyChart app or website. ° °PLEASE NOTE: If you develop severe chest pain or shortness of breath please go to the ER or call 9-1-1 for further evaluation --> DO NOT schedule electronic or virtual visits for this. °Please call our office for further guidance / recommendations as needed. ° °For information about the Covid vaccine, please visit [email or web address] °

## 2019-10-15 LAB — NOVEL CORONAVIRUS, NAA: SARS-CoV-2, NAA: NOT DETECTED

## 2019-10-15 LAB — SARS-COV-2, NAA 2 DAY TAT

## 2019-10-25 ENCOUNTER — Ambulatory Visit: Payer: Self-pay | Admitting: Family Medicine

## 2019-10-28 ENCOUNTER — Other Ambulatory Visit: Payer: Self-pay

## 2019-10-28 ENCOUNTER — Encounter: Payer: Self-pay | Admitting: Family Medicine

## 2019-10-28 ENCOUNTER — Ambulatory Visit (INDEPENDENT_AMBULATORY_CARE_PROVIDER_SITE_OTHER): Payer: Medicaid Other | Admitting: Family Medicine

## 2019-10-28 ENCOUNTER — Other Ambulatory Visit: Payer: Self-pay | Admitting: Family Medicine

## 2019-10-28 VITALS — BP 139/84 | HR 72 | Temp 99.0°F | Ht 64.0 in | Wt 162.6 lb

## 2019-10-28 DIAGNOSIS — F419 Anxiety disorder, unspecified: Secondary | ICD-10-CM | POA: Diagnosis not present

## 2019-10-28 DIAGNOSIS — R0789 Other chest pain: Secondary | ICD-10-CM | POA: Diagnosis not present

## 2019-10-28 DIAGNOSIS — N6320 Unspecified lump in the left breast, unspecified quadrant: Secondary | ICD-10-CM | POA: Diagnosis not present

## 2019-10-28 DIAGNOSIS — Z09 Encounter for follow-up examination after completed treatment for conditions other than malignant neoplasm: Secondary | ICD-10-CM

## 2019-10-28 DIAGNOSIS — Z803 Family history of malignant neoplasm of breast: Secondary | ICD-10-CM | POA: Diagnosis not present

## 2019-10-28 NOTE — Progress Notes (Signed)
[facility]   Established Patient Office Visit  Subjective:  Patient ID: Regina Warren, female    DOB: 05/17/1995  Age: 24 y.o. MRN: [id number]  CC:  Chief Complaint  Patient presents with  . Follow-up    left breast lump having discomfort, not really pain.   can't sleep at night  noticed lump weeks ago.      Patient has questions about vitamin D lab   HPI Regina Warren is a 24 year old female who  presents for Sick Visit.  Current Status: Since her last office visit, she has c/o left breast lump with mild pain and tenderness. Denies nipple discharge, crusting, and nipple inversion.She denies fevers, chills, fatigue, recent infections, weight loss, and night sweats. She has not had any headaches, visual changes, dizziness, and falls. No chest pain, heart palpitations, cough and shortness of breath reported. Denies GI problems such as nausea, vomiting, diarrhea, and constipation. She has no reports of blood in stools, dysuria and hematuria. Her anxiety is moderate r/t current breast changes. She is taking all medications as prescribed.   Past Medical History:  Diagnosis Date  . Abdominal pain   . Anxiety   . Discomfort of back 03/2019  . Vitamin D deficiency 06/2018    Past Surgical History:  Procedure Laterality Date  . lung puncture      Family History  Problem Relation Age of Onset  . Colon cancer Maternal Grandmother   . Breast cancer Paternal Grandmother   . Mental illness Neg Hx     Social History   Socioeconomic History  . Marital status: Single    Spouse name: Not on file  . Number of children: Not on file  . Years of education: Not on file  . Highest education level: Not on file  Occupational History  . Not on file  Tobacco Use  . Smoking status: Never Smoker  . Smokeless tobacco: Never Used  Vaping Use  . Vaping Use: Never used  Substance and Sexual Activity  . Alcohol use: Yes     Comment: occasionally  . Drug use: Never  . Sexual activity: Yes    Birth control/protection: None  Other Topics Concern  . Not on file  Social History Narrative   ** Merged History Encounter **       Social Determinants of Health   Financial Resource Strain:   . Difficulty of Paying Living Expenses: Not on file  Food Insecurity:   . Worried About Programme researcher, broadcasting/film/video in the Last Year: Not on file  . Ran Out of Food in the Last Year: Not on file  Transportation Needs:   . Lack of Transportation (Medical): Not on file  . Lack of Transportation (Non-Medical): Not on file  Physical Activity:   . Days of Exercise per Week: Not on file  . Minutes of Exercise per Session: Not on file  Stress:   . Feeling of Stress : Not on file  Social Connections:   . Frequency of Communication with Friends and Family: Not on file  . Frequency of Social Gatherings with Friends and Family: Not on file  . Attends Religious Services: Not on file  . Active Member of Clubs or Organizations: Not on file  . Attends Banker Meetings: Not on file  . Marital Status: Not on file  Intimate Partner Violence:   . Fear of Current or Ex-Partner: Not on file  . Emotionally  Abused: Not on file  . Physically Abused: Not on file  . Sexually Abused: Not on file    Outpatient Medications Prior to Visit  Medication Sig Dispense Refill  . fluticasone (FLONASE) 50 MCG/ACT nasal spray Place 1 spray into both nostrils daily. 16 g 0  . cetirizine (ZYRTEC ALLERGY) 10 MG tablet Take 1 tablet (10 mg total) by mouth daily. (Patient not taking: Reported on 10/28/2019) 30 tablet 0  . omeprazole (PRILOSEC) 20 MG capsule Take 1 capsule (20 mg total) by mouth daily. (Patient not taking: Reported on 10/28/2019) 30 capsule 0  . Vitamin D, Ergocalciferol, (DRISDOL) 1.25 MG (50000 UT) CAPS capsule Take 1 capsule (50,000 Units total) by mouth every 7 (seven) days. (Patient not taking: Reported on 10/28/2019) 5 capsule 6   No  facility-administered medications prior to visit.    Allergies  Allergen Reactions  . Imitrex [Sumatriptan]     "made my brain feel like it was about to explode"    ROS Review of Systems  Constitutional: Negative.   HENT: Negative.   Eyes: Negative.   Respiratory: Negative.   Cardiovascular:       Chest discomfort.   Endocrine: Negative.   Genitourinary: Negative.   Allergic/Immunologic: Negative.   Neurological: Negative.   Hematological: Negative.   Psychiatric/Behavioral: Negative.       Objective:    Physical Exam Constitutional:      Appearance: Normal appearance.  HENT:     Head: Normocephalic and atraumatic.     Nose: Nose normal.     Mouth/Throat:     Mouth: Mucous membranes are moist.     Pharynx: Oropharynx is clear.  Cardiovascular:     Rate and Rhythm: Normal rate and regular rhythm.     Pulses: Normal pulses.     Heart sounds: Normal heart sounds.  Pulmonary:     Effort: Pulmonary effort is normal.     Breath sounds: Normal breath sounds.  Abdominal:     General: Bowel sounds are normal.     Palpations: Abdomen is soft.  Musculoskeletal:        General: Normal range of motion.     Cervical back: Normal range of motion and neck supple.  Skin:    General: Skin is warm and dry.  Neurological:     General: No focal deficit present.     Mental Status: She is alert and oriented to person, place, and time.  Psychiatric:        Mood and Affect: Mood normal.        Behavior: Behavior normal.        Thought Content: Thought content normal.        Judgment: Judgment normal.     BP 139/84   Pulse 72   Temp 99 F (37.2 C) (Temporal)   Ht 5\' 4"  (1.626 m)   Wt 162 lb 9.6 oz (73.8 kg)   LMP 10/05/2019   SpO2 100%   BMI 27.91 kg/m  Wt Readings from Last 3 Encounters:  10/28/19 162 lb 9.6 oz (73.8 kg)  10/13/19 160 lb (72.6 kg)  09/14/19 158 lb 3.2 oz (71.8 kg)     Health Maintenance Due  Topic Date Due  . PAP-Cervical Cytology Screening   05/10/2019    There are no preventive care reminders to display for this patient.  Lab Results  Component Value Date   TSH 2.39 05/07/2016   Lab Results  Component Value Date   WBC 3.6 07/25/2019  HGB 12.7 07/25/2019   HCT 38.3 07/25/2019   MCV 96 07/25/2019   PLT 242 07/25/2019   Lab Results  Component Value Date   NA 138 07/25/2019   K 4.2 07/25/2019   CO2 21 07/25/2019   GLUCOSE 90 07/25/2019   BUN 9 07/25/2019   CREATININE 0.70 07/25/2019   BILITOT 0.3 07/25/2019   ALKPHOS 61 07/25/2019   AST 17 07/25/2019   ALT 13 07/25/2019   PROT 6.7 07/25/2019   ALBUMIN 4.1 07/25/2019   CALCIUM 9.1 07/25/2019   ANIONGAP 9 10/07/2017   No results found for: CHOL No results found for: HDL No results found for: LDLCALC No results found for: TRIG No results found for: [facility] Lab Results  Component Value Date   HGBA1C 5.0 04/13/2019    Assessment & Plan:   1. Chest discomfort - H. pylori breath test - EKG 12-Lead  2. Left breast lump Order for Mammogram placed today.  3. Family history of breast cancer  4. Anxiety  5. Follow up She will keep follow up appointment..   No orders of the defined types were placed in this encounter.   Orders Placed This Encounter  Procedures  . H. pylori breath test  . EKG 12-Lead    Referral Orders  No referral(s) requested today    Raliegh Ip,  MSN, FNP-BC [facility] [address] [place], [place] [address] [phone] fax   Problem List Items Addressed This Visit    None    Visit Diagnoses    Chest discomfort    -  Primary   Relevant Orders   H. pylori breath test (Completed)   EKG 12-Lead   Left breast lump       Family history of breast cancer       Anxiety       Follow up          No orders of the defined types were placed in this encounter.   Follow-up: Return in about 6 months (around  04/27/2020).    Kallie Locks, FNP

## 2019-10-30 LAB — H. PYLORI BREATH TEST: H pylori Breath Test: NEGATIVE

## 2019-11-01 ENCOUNTER — Other Ambulatory Visit: Payer: Self-pay

## 2019-11-01 ENCOUNTER — Ambulatory Visit
Admission: RE | Admit: 2019-11-01 | Discharge: 2019-11-01 | Disposition: A | Discharge status: Home / Self Care | Payer: Medicaid Other | Source: Ambulatory Visit | Attending: Family Medicine | Admitting: Family Medicine

## 2019-11-01 DIAGNOSIS — N6320 Unspecified lump in the left breast, unspecified quadrant: Secondary | ICD-10-CM

## 2019-11-01 DIAGNOSIS — N6489 Other specified disorders of breast: Secondary | ICD-10-CM | POA: Diagnosis not present

## 2019-11-03 ENCOUNTER — Encounter: Payer: Self-pay | Admitting: Family Medicine

## 2019-11-03 ENCOUNTER — Other Ambulatory Visit: Payer: Medicaid Other

## 2019-11-24 ENCOUNTER — Ambulatory Visit: Admit: 2019-11-24 | Discharge status: Absent without leave | Payer: Medicaid Other

## 2019-11-24 ENCOUNTER — Ambulatory Visit
Admission: EM | Admit: 2019-11-24 | Discharge: 2019-11-24 | Disposition: A | Discharge status: Home / Self Care | Payer: Medicaid Other | Attending: Emergency Medicine | Admitting: Emergency Medicine

## 2019-11-24 ENCOUNTER — Other Ambulatory Visit: Payer: Self-pay

## 2019-11-24 DIAGNOSIS — B3731 Acute candidiasis of vulva and vagina: Secondary | ICD-10-CM

## 2019-11-24 DIAGNOSIS — B373 Candidiasis of vulva and vagina: Secondary | ICD-10-CM | POA: Diagnosis not present

## 2019-11-24 LAB — POCT URINALYSIS DIP (MANUAL ENTRY)
Bilirubin, UA: NEGATIVE
Blood, UA: NEGATIVE
Glucose, UA: NEGATIVE mg/dL
Ketones, POC UA: NEGATIVE mg/dL
Leukocytes, UA: NEGATIVE
Nitrite, UA: NEGATIVE
Protein Ur, POC: NEGATIVE mg/dL
Spec Grav, UA: 1.02 (ref 1.010–1.025)
Urobilinogen, UA: 0.2 E.U./dL
pH, UA: 8.5 — AB (ref 5.0–8.0)

## 2019-11-24 LAB — POCT URINE PREGNANCY: Preg Test, Ur: NEGATIVE

## 2019-11-24 MED ORDER — FLUCONAZOLE 150 MG PO TABS
150.0000 mg | ORAL_TABLET | Freq: Every day | ORAL | 0 refills | Status: DC
Start: 2019-11-24 — End: 2019-12-16

## 2019-11-24 NOTE — ED Provider Notes (Signed)
[name]    CSN: [id number] Arrival date & time: 11/24/19  1707      History   Chief Complaint Chief Complaint  Patient presents with  . Urinary Tract Infection    HPI Regina Warren is a 24 y.o. female  Presenting for vulvar irritation and burning with pruritus.  Also endorsing urinary frequency for the last 2 days.  States that she has white vaginal discharge that is not thick.  No odor.  Endorsing history of BV: States this feels different.  Does use condoms, they are requesting STI testing.  Past Medical History:  Diagnosis Date  . Abdominal pain   . Anxiety   . Discomfort of back 03/2019  . Vitamin D deficiency 06/2018    Patient Active Problem List   Diagnosis Date Noted  . Runny nose 08/12/2019  . Discomfort of back 04/02/2019  . Intractable headache 07/25/2018  . Right lower quadrant abdominal pain 05/21/2018  . Urinary tract infection without hematuria 05/21/2018  . Pelvic pain 03/30/2018  . Vaginal discharge 03/30/2018  . Elevated BP 03/14/2014    Past Surgical History:  Procedure Laterality Date  . lung puncture      OB History    Gravida  1   Para      Term      Preterm      AB      Living  1     SAB      TAB      Ectopic      Multiple      Live Births               Home Medications    Prior to Admission medications   Medication Sig Start Date End Date Taking? Authorizing Provider  fluconazole (DIFLUCAN) 150 MG tablet Take 1 tablet (150 mg total) by mouth daily. May repeat in 72 hours if needed 11/24/19   Hall-Potvin, [place], PA-C    Family History Family History  Problem Relation Age of Onset  . Colon cancer Maternal Grandmother   . Breast cancer Paternal Grandmother   . Mental illness Neg Hx     Social History Social History   Tobacco Use  . Smoking status: Never Smoker  . Smokeless tobacco: Never Used  Vaping Use  . Vaping Use: Never used  Substance Use Topics  . Alcohol use:  Yes    Comment: occasionally  . Drug use: Never     Allergies   Imitrex [sumatriptan]   Review of Systems As per HPI   Physical Exam Triage Vital Signs ED Triage Vitals  Enc Vitals Group     BP      Pulse      Resp      Temp      Temp src      SpO2      Weight      Height      Head Circumference      Peak Flow      Pain Score      Pain Loc      Pain Edu?      Excl. in GC?    No data found.  Updated Vital Signs BP 128/83 (BP Location: Right Arm)   Pulse 70   Temp 97.9 F (36.6 C) (Oral)   Resp 20   LMP 11/07/2019   SpO2 98%   Visual Acuity Right Eye Distance:   Left Eye Distance:   Bilateral Distance:  Right Eye Near:   Left Eye Near:    Bilateral Near:     Physical Exam Constitutional:      General: She is not in acute distress. HENT:     Head: Normocephalic and atraumatic.  Eyes:     General: No scleral icterus.    Pupils: Pupils are equal, round, and reactive to light.  Cardiovascular:     Rate and Rhythm: Normal rate.  Pulmonary:     Effort: Pulmonary effort is normal.  Abdominal:     General: Bowel sounds are normal.     Palpations: Abdomen is soft.     Tenderness: There is no abdominal tenderness. There is no right CVA tenderness, left CVA tenderness or guarding.  Genitourinary:    Comments: Patient declined, self-swab performed Skin:    Coloration: Skin is not jaundiced or pale.  Neurological:     Mental Status: She is alert and oriented to person, place, and time.      UC Treatments / Results  Labs (all labs ordered are listed, but only abnormal results are displayed) Labs Reviewed  POCT URINALYSIS DIP (MANUAL ENTRY) - Abnormal; Notable for the following components:      Result Value   pH, UA 8.5 (*)    All other components within normal limits  POCT URINE PREGNANCY  CERVICOVAGINAL ANCILLARY ONLY    EKG   Radiology No results found.  Procedures Procedures (including critical care time)  Medications Ordered in  UC Medications - No data to display  Initial Impression / Assessment and Plan / UC Course  I have reviewed the triage vital signs and the nursing notes.  Pertinent labs & imaging results that were available during my care of the patient were reviewed by me and considered in my medical decision making (see chart for details).     Urine pregnancy negative, urine unremarkable.  Will treat for suspected yeast vaginitis as outlined below.  Cytology pending: We will treat if indicated.  Return precautions discussed, pt verbalized understanding and is agreeable to plan. Final Clinical Impressions(s) / UC Diagnoses   Final diagnoses:  Yeast vaginitis     Discharge Instructions     Today you received treatment for yeast. Testing for chlamydia, gonorrhea, trichomonas is pending: please look for these results on the MyChart app/website.  We will notify you if you are positive and outline treatment at that time.  Important to avoid all forms of sexual intercourse (oral, vaginal, anal) with any/all partners for the next 7 days to avoid spreading/reinfecting. Any/all sexual partners should be notified of testing/treatment today.  Return for persistent/worsening symptoms or if you develop fever, abdominal or pelvic pain, discharge, genital pain, blood in your urine, or are re-exposed to an STI.    ED Prescriptions    Medication Sig Dispense Auth. Provider   fluconazole (DIFLUCAN) 150 MG tablet Take 1 tablet (150 mg total) by mouth daily. May repeat in 72 hours if needed 2 tablet Hall-Potvin, [place], PA-C     PDMP not reviewed this encounter.   Hall-Potvin, [place], [place] 11/24/19 1831

## 2019-11-24 NOTE — ED Triage Notes (Signed)
Pt c/o burning sensation to volva area with urinary frequency x2 days. States having white vaginal discharge with no odor. States uses condoms.

## 2019-11-24 NOTE — Discharge Instructions (Addendum)
Today you received treatment for yeast. Testing for chlamydia, gonorrhea, trichomonas is pending: please look for these results on the MyChart app/website.  We will notify you if you are positive and outline treatment at that time.  Important to avoid all forms of sexual intercourse (oral, vaginal, anal) with any/all partners for the next 7 days to avoid spreading/reinfecting. Any/all sexual partners should be notified of testing/treatment today.  Return for persistent/worsening symptoms or if you develop fever, abdominal or pelvic pain, discharge, genital pain, blood in your urine, or are re-exposed to an STI. 

## 2019-11-28 ENCOUNTER — Telehealth: Payer: Self-pay | Admitting: Emergency Medicine

## 2019-11-28 LAB — CERVICOVAGINAL ANCILLARY ONLY
Bacterial Vaginitis (gardnerella): POSITIVE — AB
Chlamydia: NEGATIVE
Comment: NEGATIVE
Comment: NEGATIVE
Comment: NEGATIVE
Comment: NORMAL
Neisseria Gonorrhea: NEGATIVE
Trichomonas: NEGATIVE

## 2019-11-28 MED ORDER — METRONIDAZOLE 500 MG PO TABS: 500.0000 mg | ORAL_TABLET | Freq: Two times a day (BID) | ORAL | 0 refills | Status: DC

## 2019-11-28 NOTE — Telephone Encounter (Signed)
Pt tested positive for BV, requesting treatment.  Sent

## 2019-11-29 ENCOUNTER — Other Ambulatory Visit: Payer: Self-pay

## 2019-11-29 ENCOUNTER — Ambulatory Visit (HOSPITAL_COMMUNITY): Payer: Self-pay

## 2019-11-29 ENCOUNTER — Ambulatory Visit (HOSPITAL_COMMUNITY)
Admission: EM | Admit: 2019-11-29 | Discharge: 2019-11-29 | Disposition: A | Discharge status: Home / Self Care | Payer: Medicaid Other | Attending: Family Medicine | Admitting: Family Medicine

## 2019-11-29 ENCOUNTER — Encounter (HOSPITAL_COMMUNITY): Payer: Self-pay

## 2019-11-29 DIAGNOSIS — Z202 Contact with and (suspected) exposure to infections with a predominantly sexual mode of transmission: Secondary | ICD-10-CM | POA: Diagnosis not present

## 2019-11-29 DIAGNOSIS — Z20822 Contact with and (suspected) exposure to covid-19: Secondary | ICD-10-CM | POA: Insufficient documentation

## 2019-11-29 DIAGNOSIS — Z79899 Other long term (current) drug therapy: Secondary | ICD-10-CM | POA: Diagnosis not present

## 2019-11-29 DIAGNOSIS — Z113 Encounter for screening for infections with a predominantly sexual mode of transmission: Secondary | ICD-10-CM | POA: Diagnosis not present

## 2019-11-29 DIAGNOSIS — J029 Acute pharyngitis, unspecified: Secondary | ICD-10-CM | POA: Insufficient documentation

## 2019-11-29 DIAGNOSIS — J302 Other seasonal allergic rhinitis: Secondary | ICD-10-CM | POA: Insufficient documentation

## 2019-11-29 LAB — POCT URINALYSIS DIPSTICK, ED / UC
Bilirubin Urine: NEGATIVE
Glucose, UA: NEGATIVE mg/dL
Hgb urine dipstick: NEGATIVE
Ketones, ur: NEGATIVE mg/dL
Leukocytes,Ua: NEGATIVE
Nitrite: NEGATIVE
Protein, ur: NEGATIVE mg/dL
Specific Gravity, Urine: 1.01 (ref 1.005–1.030)
Urobilinogen, UA: 0.2 mg/dL (ref 0.0–1.0)
pH: 7 (ref 5.0–8.0)

## 2019-11-29 LAB — POC URINE PREG, ED: Preg Test, Ur: NEGATIVE

## 2019-11-29 LAB — SARS CORONAVIRUS 2 (TAT 6-24 HRS): SARS Coronavirus 2: NEGATIVE

## 2019-11-29 LAB — HIV ANTIBODY (ROUTINE TESTING W REFLEX): HIV Screen 4th Generation wRfx: NONREACTIVE

## 2019-11-29 MED ORDER — CETIRIZINE HCL 10 MG PO TABS: 10.0000 mg | ORAL_TABLET | Freq: Every day | ORAL | 0 refills | Status: DC

## 2019-11-29 MED ORDER — FLUTICASONE PROPIONATE 50 MCG/ACT NA SUSP: 1.0000 | Freq: Every day | NASAL | 2 refills | Status: DC

## 2019-11-29 NOTE — ED Notes (Signed)
Cervicovaginal swab placed in lab,verified label covid swab obtained, labeled and placed in lab

## 2019-11-29 NOTE — Discharge Instructions (Signed)
Checking you for STDs. You can check your MyChart for results. Covid swab pending. Recommended Flonase and Zyrtec.

## 2019-11-29 NOTE — ED Triage Notes (Addendum)
Pt c/o mild sore throat with swallowing, HA, nausea and runny nose for 3 days. Also requests "routine, yearly" STI testing to include swab and blood work.   Denies fever, v/d, abdominal pain, ear pain, SOB, body aches, vaginal discharge, dysuria sx.   took aleve yesterday

## 2019-11-30 LAB — RPR: RPR Ser Ql: NONREACTIVE

## 2019-11-30 NOTE — ED Provider Notes (Signed)
[facility]    CSN: [id number] Arrival date & time: 11/29/19  1000      History   Chief Complaint Chief Complaint  Patient presents with  . Sore Throat  . Exposure to STD    HPI Regina Warren is a 24 y.o. female.   Patient is a 24 year old female who presents today for complaints of mild sore throat with swallowing, headache, nausea and runny nose for 3 days.  Requesting Covid testing.  Does have a history of seasonal allergies.  No associated fever, chills, body aches, ear pain. She would also like her annual STI testing.  She is currently with a new partner.  Had a vaginal swab done 3 days ago was negative for gonorrhea, chlamydia and trichomonas.  No vaginal discharge, itching, irritation, abdominal pain, dysuria, hematuria or urinary frequency. Patient's last menstrual period was 11/07/2019.       Past Medical History:  Diagnosis Date  . Abdominal pain   . Anxiety   . Discomfort of back 03/2019  . Vitamin D deficiency 06/2018    Patient Active Problem List   Diagnosis Date Noted  . Runny nose 08/12/2019  . Discomfort of back 04/02/2019  . Intractable headache 07/25/2018  . Right lower quadrant abdominal pain 05/21/2018  . Urinary tract infection without hematuria 05/21/2018  . Pelvic pain 03/30/2018  . Vaginal discharge 03/30/2018  . Elevated BP 03/14/2014    Past Surgical History:  Procedure Laterality Date  . lung puncture      OB History    Gravida  1   Para      Term      Preterm      AB      Living  1     SAB      TAB      Ectopic      Multiple      Live Births               Home Medications    Prior to Admission medications   Medication Sig Start Date End Date Taking? Authorizing Provider  cetirizine (ZYRTEC) 10 MG tablet Take 1 tablet (10 mg total) by mouth daily. 11/29/19   Dahlia Byes A, NP  fluconazole (DIFLUCAN) 150 MG tablet Take 1 tablet (150 mg total) by mouth daily. May repeat in 72 hours  if needed 11/24/19   Hall-Potvin, [place], PA-C  fluticasone (FLONASE) 50 MCG/ACT nasal spray Place 1 spray into both nostrils daily. 11/29/19   Dahlia Byes A, NP  metroNIDAZOLE (FLAGYL) 500 MG tablet Take 1 tablet (500 mg total) by mouth 2 (two) times daily. 11/28/19   Hall-Potvin, [place], PA-C    Family History Family History  Problem Relation Age of Onset  . Colon cancer Maternal Grandmother   . Breast cancer Paternal Grandmother   . Mental illness Neg Hx     Social History Social History   Tobacco Use  . Smoking status: Never Smoker  . Smokeless tobacco: Never Used  Vaping Use  . Vaping Use: Never used  Substance Use Topics  . Alcohol use: Yes    Comment: occasionally  . Drug use: Never     Allergies   Imitrex [sumatriptan]   Review of Systems Review of Systems   Physical Exam Triage Vital Signs ED Triage Vitals  Enc Vitals Group     BP 11/29/19 1118 (!) 133/95     Pulse Rate 11/29/19 1118 73     Resp 11/29/19 1118 17  Temp 11/29/19 1118 97.8 F (36.6 C)     Temp Source 11/29/19 1118 Oral     SpO2 11/29/19 1118 100 %     Weight --      Height --      Head Circumference --      Peak Flow --      Pain Score 11/29/19 1113 6     Pain Loc --      Pain Edu? --      Excl. in GC? --    No data found.  Updated Vital Signs BP (!) 133/95 (BP Location: Right Arm)   Pulse 73   Temp 97.8 F (36.6 C) (Oral)   Resp 17   LMP 11/07/2019   SpO2 100%   Visual Acuity Right Eye Distance:   Left Eye Distance:   Bilateral Distance:    Right Eye Near:   Left Eye Near:    Bilateral Near:     Physical Exam Vitals and nursing note reviewed.  Constitutional:      General: She is not in acute distress.    Appearance: Normal appearance. She is not ill-appearing, toxic-appearing or diaphoretic.  HENT:     Head: Normocephalic.     Nose: Nose normal.  Eyes:     Conjunctiva/sclera: Conjunctivae normal.  Pulmonary:     Effort: Pulmonary effort is normal.    Musculoskeletal:        General: Normal range of motion.     Cervical back: Normal range of motion.  Skin:    General: Skin is warm and dry.     Findings: No rash.  Neurological:     Mental Status: She is alert.  Psychiatric:        Mood and Affect: Mood normal.      UC Treatments / Results  Labs (all labs ordered are listed, but only abnormal results are displayed) Labs Reviewed  SARS CORONAVIRUS 2 (TAT 6-24 HRS)  HIV ANTIBODY (ROUTINE TESTING W REFLEX)  RPR  POCT URINALYSIS DIPSTICK, ED / UC  POC URINE PREG, ED  CERVICOVAGINAL ANCILLARY ONLY    EKG   Radiology No results found.  Procedures Procedures (including critical care time)  Medications Ordered in UC Medications - No data to display  Initial Impression / Assessment and Plan / UC Course  I have reviewed the triage vital signs and the nursing notes.  Pertinent labs & imaging results that were available during my care of the patient were reviewed by me and considered in my medical decision making (see chart for details).     STD screening Self swab sent for testing and HIV and syphilis testing done.  Sore throat Most likely viral or allergy Covid swab pending. Flonase and Zyrtec to treat Follow up as needed for continued or worsening symptoms  Final Clinical Impressions(s) / UC Diagnoses   Final diagnoses:  Screening examination for STD (sexually transmitted disease)  Sore throat     Discharge Instructions     Checking you for STDs. You can check your MyChart for results. Covid swab pending. Recommended Flonase and Zyrtec.    ED Prescriptions    Medication Sig Dispense Auth. Provider   fluticasone (FLONASE) 50 MCG/ACT nasal spray Place 1 spray into both nostrils daily. 16 g Giankarlo Leamer A, NP   cetirizine (ZYRTEC) 10 MG tablet Take 1 tablet (10 mg total) by mouth daily. 30 tablet Dahlia Byes A, NP     PDMP not reviewed this encounter.   Dahlia Byes  A, NP 11/30/19 1950

## 2019-12-01 LAB — CERVICOVAGINAL ANCILLARY ONLY
Chlamydia: NEGATIVE
Comment: NEGATIVE
Comment: NEGATIVE
Comment: NORMAL
Neisseria Gonorrhea: NEGATIVE
Trichomonas: POSITIVE — AB

## 2019-12-02 ENCOUNTER — Telehealth (HOSPITAL_COMMUNITY): Payer: Self-pay

## 2019-12-02 NOTE — Telephone Encounter (Signed)
Pt tested positive for trichomonas and was concerned about a prescription. Pt has be prescribed Flagy on 11/28/19 from [place].  Staff informed her that she would not need a new prescription  To completed the RX she has been giving.

## 2019-12-09 DIAGNOSIS — Z114 Encounter for screening for human immunodeficiency virus [HIV]: Secondary | ICD-10-CM | POA: Diagnosis not present

## 2019-12-09 DIAGNOSIS — Z113 Encounter for screening for infections with a predominantly sexual mode of transmission: Secondary | ICD-10-CM | POA: Diagnosis not present

## 2019-12-12 ENCOUNTER — Ambulatory Visit: Payer: Medicaid Other | Admitting: Family Medicine

## 2019-12-16 ENCOUNTER — Ambulatory Visit
Admission: EM | Admit: 2019-12-16 | Discharge: 2019-12-16 | Disposition: A | Discharge status: Home / Self Care | Payer: Medicaid Other | Attending: Physician Assistant | Admitting: Physician Assistant

## 2019-12-16 DIAGNOSIS — J069 Acute upper respiratory infection, unspecified: Secondary | ICD-10-CM

## 2019-12-16 DIAGNOSIS — Z20822 Contact with and (suspected) exposure to covid-19: Secondary | ICD-10-CM | POA: Diagnosis not present

## 2019-12-16 LAB — POCT RAPID STREP A (OFFICE): Rapid Strep A Screen: NEGATIVE

## 2019-12-16 MED ORDER — BENZONATATE 200 MG PO CAPS: 200.0000 mg | ORAL_CAPSULE | Freq: Three times a day (TID) | ORAL | 0 refills | Status: DC

## 2019-12-16 MED ORDER — LIDOCAINE VISCOUS HCL 2 % MT SOLN: OROMUCOSAL | 0 refills | Status: DC

## 2019-12-16 NOTE — ED Triage Notes (Signed)
Pt c/o cough, sore throat, and chest tightness x4 days.

## 2019-12-16 NOTE — ED Provider Notes (Signed)
[name]    CSN: [id number] Arrival date & time: 12/16/19  1634      History   Chief Complaint Chief Complaint  Patient presents with  . Cough    HPI Regina Warren is a 24 y.o. female.   24 year old female comes in for 4 day of URI symptoms. Cough, nasal congestion, sore throat, chest tightness. Denies fever, chills, body aches. Denies abdominal pain, nausea, vomiting, diarrhea. Denies shortness of breath, loss of taste/smell. Never smoker.      Past Medical History:  Diagnosis Date  . Abdominal pain   . Anxiety   . Discomfort of back 03/2019  . Vitamin D deficiency 06/2018    Patient Active Problem List   Diagnosis Date Noted  . Runny nose 08/12/2019  . Discomfort of back 04/02/2019  . Intractable headache 07/25/2018  . Right lower quadrant abdominal pain 05/21/2018  . Urinary tract infection without hematuria 05/21/2018  . Pelvic pain 03/30/2018  . Vaginal discharge 03/30/2018  . Elevated BP 03/14/2014    Past Surgical History:  Procedure Laterality Date  . lung puncture      OB History    Gravida  1   Para      Term      Preterm      AB      Living  1     SAB      TAB      Ectopic      Multiple      Live Births               Home Medications    Prior to Admission medications   Medication Sig Start Date End Date Taking? Authorizing Provider  benzonatate (TESSALON) 200 MG capsule Take 1 capsule (200 mg total) by mouth every 8 (eight) hours. 12/16/19   Cathie Hoops, Rebakah Cokley V, PA-C  lidocaine (XYLOCAINE) 2 % solution 5-15 mL gargle as needed 12/16/19   Belinda Fisher, PA-C    Family History Family History  Problem Relation Age of Onset  . Colon cancer Maternal Grandmother   . Breast cancer Paternal Grandmother   . Mental illness Neg Hx     Social History Social History   Tobacco Use  . Smoking status: Never Smoker  . Smokeless tobacco: Never Used  Vaping Use  . Vaping Use: Never used  Substance Use Topics    . Alcohol use: Yes    Comment: occasionally  . Drug use: Never     Allergies   Imitrex [sumatriptan]   Review of Systems Review of Systems  Reason unable to perform ROS: See HPI as above.     Physical Exam Triage Vital Signs ED Triage Vitals  Enc Vitals Group     BP 12/16/19 1646 137/81     Pulse Rate 12/16/19 1646 73     Resp 12/16/19 1646 18     Temp 12/16/19 1646 97.9 F (36.6 C)     Temp Source 12/16/19 1646 Oral     SpO2 12/16/19 1646 97 %     Weight --      Height --      Head Circumference --      Peak Flow --      Pain Score 12/16/19 1647 6     Pain Loc --      Pain Edu? --      Excl. in GC? --    No data found.  Updated Vital Signs BP 137/81 (  BP Location: Left Arm)   Pulse 73   Temp 97.9 F (36.6 C) (Oral)   Resp 18   LMP 12/11/2019   SpO2 97%   Physical Exam Constitutional:      General: She is not in acute distress.    Appearance: Normal appearance. She is not ill-appearing, toxic-appearing or diaphoretic.  HENT:     Head: Normocephalic and atraumatic.     Mouth/Throat:     Mouth: Mucous membranes are moist.     Pharynx: Oropharynx is clear. Uvula midline.     Tonsils: No tonsillar exudate. 1+ on the right. 1+ on the left.  Cardiovascular:     Rate and Rhythm: Normal rate and regular rhythm.     Heart sounds: Normal heart sounds. No murmur heard.  No friction rub. No gallop.   Pulmonary:     Effort: Pulmonary effort is normal. No accessory muscle usage, prolonged expiration, respiratory distress or retractions.     Comments: Lungs clear to auscultation without adventitious lung sounds. Musculoskeletal:     Cervical back: Normal range of motion and neck supple.  Neurological:     General: No focal deficit present.     Mental Status: She is alert and oriented to person, place, and time.      UC Treatments / Results  Labs (all labs ordered are listed, but only abnormal results are displayed) Labs Reviewed  NOVEL CORONAVIRUS, NAA   CULTURE, GROUP A STREP [facility])  POCT RAPID STREP A (OFFICE)    EKG   Radiology No results found.  Procedures Procedures (including critical care time)  Medications Ordered in UC Medications - No data to display  Initial Impression / Assessment and Plan / UC Course  I have reviewed the triage vital signs and the nursing notes.  Pertinent labs & imaging results that were available during my care of the patient were reviewed by me and considered in my medical decision making (see chart for details).    Patient requesting strep testing-- negative. COVID PCR test ordered. Patient to quarantine until testing results return. LCTAB. Symptomatic treatment discussed.  Push fluids.  Return precautions given.  Patient expresses understanding and agrees to plan.  Final Clinical Impressions(s) / UC Diagnoses   Final diagnoses:  Encounter for screening laboratory testing for COVID-19 virus  Viral URI    ED Prescriptions    Medication Sig Dispense Auth. Provider   benzonatate (TESSALON) 200 MG capsule Take 1 capsule (200 mg total) by mouth every 8 (eight) hours. 21 capsule Andren Bethea V, PA-C   lidocaine (XYLOCAINE) 2 % solution 5-15 mL gargle as needed 150 mL Belinda Fisher, PA-C     PDMP not reviewed this encounter.   Belinda Fisher, PA-C 12/16/19 1734

## 2019-12-16 NOTE — Discharge Instructions (Signed)
Rapid strep negative. COVID PCR testing ordered. I would like you to quarantine until testing results. Tessalon for cough. Start lidocaine for sore throat, do not eat or drink for the next 40 mins after use as it can stunt your gag reflex. You can take over the counter flonase/nasacort to help with nasal congestion/drainage. Tylenol/motrin for pain and fever. Keep hydrated, urine should be clear to pale yellow in color. If experiencing shortness of breath, trouble breathing, go to the emergency department for further evaluation needed.

## 2019-12-17 LAB — SARS-COV-2, NAA 2 DAY TAT

## 2019-12-17 LAB — NOVEL CORONAVIRUS, NAA: SARS-CoV-2, NAA: NOT DETECTED

## 2019-12-22 LAB — CULTURE, GROUP A STREP (THRC)

## 2020-01-06 ENCOUNTER — Ambulatory Visit
Admission: EM | Admit: 2020-01-06 | Discharge: 2020-01-06 | Disposition: A | Discharge status: Home / Self Care | Payer: Medicaid Other | Attending: Family Medicine | Admitting: Family Medicine

## 2020-01-06 ENCOUNTER — Other Ambulatory Visit: Payer: Self-pay

## 2020-01-06 DIAGNOSIS — R350 Frequency of micturition: Secondary | ICD-10-CM | POA: Insufficient documentation

## 2020-01-06 LAB — POCT URINALYSIS DIP (MANUAL ENTRY)
Bilirubin, UA: NEGATIVE
Blood, UA: NEGATIVE
Glucose, UA: NEGATIVE mg/dL
Ketones, POC UA: NEGATIVE mg/dL
Leukocytes, UA: NEGATIVE
Nitrite, UA: NEGATIVE
Protein Ur, POC: NEGATIVE mg/dL
Spec Grav, UA: >=1.03 — AB (ref 1.010–1.025)
Urobilinogen, UA: 1 E.U./dL
pH, UA: 7 (ref 5.0–8.0)

## 2020-01-06 LAB — POCT URINE PREGNANCY: Preg Test, Ur: NEGATIVE

## 2020-01-06 MED ORDER — NITROFURANTOIN MONOHYD MACRO 100 MG PO CAPS: 100.0000 mg | ORAL_CAPSULE | Freq: Two times a day (BID) | ORAL | 0 refills | Status: DC

## 2020-01-06 NOTE — ED Triage Notes (Signed)
Patient complains of urinary urgency and frequency and has a white discharge x 1 week. Pt is aox4 and ambulatory.

## 2020-01-06 NOTE — ED Provider Notes (Signed)
[name]    CSN: [id number] Arrival date & time: 01/06/20  1749      History   Chief Complaint Chief Complaint  Patient presents with  . Vaginal Discharge    X 1 week    HPI Regina Warren is a 24 y.o. female.   Patient has urinary urgency and frequency with white vaginal discharge.  She denies vaginal itching.  She denies any odor with the discharge.  HPI  Past Medical History:  Diagnosis Date  . Abdominal pain   . Anxiety   . Discomfort of back 03/2019  . Vitamin D deficiency 06/2018    Patient Active Problem List   Diagnosis Date Noted  . Runny nose 08/12/2019  . Discomfort of back 04/02/2019  . Intractable headache 07/25/2018  . Right lower quadrant abdominal pain 05/21/2018  . Urinary tract infection without hematuria 05/21/2018  . Pelvic pain 03/30/2018  . Vaginal discharge 03/30/2018  . Elevated BP 03/14/2014    Past Surgical History:  Procedure Laterality Date  . lung puncture      OB History    Gravida  1   Para      Term      Preterm      AB      Living  1     SAB      IAB      Ectopic      Multiple      Live Births               Home Medications    Prior to Admission medications   Medication Sig Start Date End Date Taking? Authorizing Provider  benzonatate (TESSALON) 200 MG capsule Take 1 capsule (200 mg total) by mouth every 8 (eight) hours. 12/16/19   Cathie Hoops, Amy V, PA-C  lidocaine (XYLOCAINE) 2 % solution 5-15 mL gargle as needed 12/16/19   Belinda Fisher, PA-C    Family History Family History  Problem Relation Age of Onset  . Colon cancer Maternal Grandmother   . Breast cancer Paternal Grandmother   . Mental illness Neg Hx     Social History Social History   Tobacco Use  . Smoking status: Never Smoker  . Smokeless tobacco: Never Used  Vaping Use  . Vaping Use: Never used  Substance Use Topics  . Alcohol use: Yes    Comment: occasionally  . Drug use: Never     Allergies    Imitrex [sumatriptan]   Review of Systems Review of Systems  Genitourinary: Positive for frequency, urgency and vaginal discharge.  All other systems reviewed and are negative.    Physical Exam Triage Vital Signs ED Triage Vitals  Enc Vitals Group     BP 01/06/20 1804 122/85     Pulse Rate 01/06/20 1804 75     Resp 01/06/20 1804 20     Temp 01/06/20 1804 98.7 F (37.1 C)     Temp Source 01/06/20 1804 Oral     SpO2 01/06/20 1804 97 %     Weight --      Height --      Head Circumference --      Peak Flow --      Pain Score 01/06/20 1815 0     Pain Loc --      Pain Edu? --      Excl. in GC? --    No data found.  Updated Vital Signs BP 122/85 (BP Location: Right Arm)  Pulse 75   Temp 98.7 F (37.1 C) (Oral)   Resp 20   LMP 12/11/2019 (Exact Date)   SpO2 97%   Visual Acuity Right Eye Distance:   Left Eye Distance:   Bilateral Distance:    Right Eye Near:   Left Eye Near:    Bilateral Near:     Physical Exam Vitals and nursing note reviewed.  Constitutional:      Appearance: Normal appearance.  HENT:     Nose: Nose normal.     Mouth/Throat:     Mouth: Mucous membranes are moist.  Cardiovascular:     Pulses: Normal pulses.     Heart sounds: Normal heart sounds.  Pulmonary:     Effort: Pulmonary effort is normal.     Breath sounds: Normal breath sounds.  Abdominal:     General: Abdomen is flat.     Palpations: Abdomen is soft.  Neurological:     General: No focal deficit present.     Mental Status: She is alert and oriented to person, place, and time.      UC Treatments / Results  Labs (all labs ordered are listed, but only abnormal results are displayed) Labs Reviewed  POCT URINALYSIS DIP (MANUAL ENTRY) - Abnormal; Notable for the following components:      Result Value   Spec Grav, UA >=1.030 (*)    All other components within normal limits  POCT URINE PREGNANCY    EKG   Radiology No results found.  Procedures Procedures  (including critical care time)  Medications Ordered in UC Medications - No data to display  Initial Impression / Assessment and Plan / UC Course  I have reviewed the triage vital signs and the nursing notes.  Pertinent labs & imaging results that were available during my care of the patient were reviewed by me and considered in my medical decision making (see chart for details).     Lower urinary tract symptoms suggest infection but urinalysis does not support.  Will do culture and start antibiotic pending results of culture.  Also do vaginal swab looking for vaginal yeast infections or other. Final Clinical Impressions(s) / UC Diagnoses   Final diagnoses:  None   Discharge Instructions   None    ED Prescriptions    None     PDMP not reviewed this encounter.   Frederica Kuster, MD 01/06/20 [id number]

## 2020-01-10 LAB — CYTOLOGY, (ORAL, ANAL, URETHRAL) ANCILLARY ONLY
Chlamydia: NEGATIVE
Comment: NEGATIVE
Comment: NEGATIVE
Comment: NORMAL
Neisseria Gonorrhea: NEGATIVE
Trichomonas: NEGATIVE

## 2020-02-02 ENCOUNTER — Ambulatory Visit
Admission: EM | Admit: 2020-02-02 | Discharge: 2020-02-02 | Disposition: A | Discharge status: Home / Self Care | Payer: Medicaid Other | Attending: Internal Medicine | Admitting: Internal Medicine

## 2020-02-02 ENCOUNTER — Other Ambulatory Visit: Payer: Self-pay

## 2020-02-02 DIAGNOSIS — R1031 Right lower quadrant pain: Secondary | ICD-10-CM

## 2020-02-02 DIAGNOSIS — R1032 Left lower quadrant pain: Secondary | ICD-10-CM | POA: Diagnosis not present

## 2020-02-02 LAB — POCT URINALYSIS DIP (MANUAL ENTRY)
Bilirubin, UA: NEGATIVE
Blood, UA: NEGATIVE
Glucose, UA: NEGATIVE mg/dL
Leukocytes, UA: NEGATIVE
Nitrite, UA: NEGATIVE
Protein Ur, POC: NEGATIVE mg/dL
Spec Grav, UA: 1.025 (ref 1.010–1.025)
Urobilinogen, UA: 0.2 E.U./dL
pH, UA: 7.5 (ref 5.0–8.0)

## 2020-02-02 LAB — POCT URINE PREGNANCY: Preg Test, Ur: NEGATIVE

## 2020-02-02 NOTE — Discharge Instructions (Signed)
Sign up for my chart We will call you if your labs are abnormal Increase your oral fluid intake

## 2020-02-02 NOTE — ED Triage Notes (Signed)
Pt said she was here about 3 week ago with same urinary symptoms and they have not resolved. Pt said urination frequency and abdominal pain.

## 2020-02-04 LAB — URINE CULTURE
Culture: NO GROWTH
Special Requests: NORMAL

## 2020-02-05 DIAGNOSIS — Z20822 Contact with and (suspected) exposure to covid-19: Secondary | ICD-10-CM | POA: Diagnosis not present

## 2020-02-06 ENCOUNTER — Other Ambulatory Visit: Payer: Self-pay

## 2020-02-06 ENCOUNTER — Ambulatory Visit
Admission: RE | Admit: 2020-02-06 | Discharge: 2020-02-06 | Disposition: A | Discharge status: Home / Self Care | Payer: Medicaid Other | Source: Ambulatory Visit | Attending: Internal Medicine | Admitting: Internal Medicine

## 2020-02-06 VITALS — BP 134/83 | HR 84 | Temp 98.5°F | Resp 20

## 2020-02-06 DIAGNOSIS — K429 Umbilical hernia without obstruction or gangrene: Secondary | ICD-10-CM | POA: Diagnosis not present

## 2020-02-06 DIAGNOSIS — K641 Second degree hemorrhoids: Secondary | ICD-10-CM | POA: Diagnosis not present

## 2020-02-06 DIAGNOSIS — K5900 Constipation, unspecified: Secondary | ICD-10-CM

## 2020-02-06 LAB — CERVICOVAGINAL ANCILLARY ONLY
Bacterial Vaginitis (gardnerella): NEGATIVE
Candida Glabrata: NEGATIVE
Candida Vaginitis: NEGATIVE
Chlamydia: NEGATIVE
Comment: NEGATIVE
Comment: NEGATIVE
Comment: NEGATIVE
Comment: NEGATIVE
Comment: NEGATIVE
Comment: NORMAL
Neisseria Gonorrhea: NEGATIVE
Trichomonas: NEGATIVE

## 2020-02-06 MED ORDER — SENNOSIDES-DOCUSATE SODIUM 8.6-50 MG PO TABS: 1.0000 | ORAL_TABLET | Freq: Two times a day (BID) | ORAL | 0 refills | Status: DC

## 2020-02-06 MED ORDER — HYDROCORTISONE ACETATE 25 MG RE SUPP: 25.0000 mg | Freq: Two times a day (BID) | RECTAL | 0 refills | Status: DC

## 2020-02-06 MED ORDER — POLYETHYLENE GLYCOL 3350 17 G PO PACK: 17.0000 g | PACK | Freq: Every day | ORAL | 0 refills | Status: DC

## 2020-02-06 NOTE — Discharge Instructions (Signed)
Please take medications as directed Increase fruit and vegetable intake Take laxatives regularly Please call to make an appointment with the surgery office. If symptoms worsen,please return to urgent care to be re-evaluated

## 2020-02-06 NOTE — ED Triage Notes (Signed)
Pt here for blood in the stool onset 2 weeks associated w/constipation   Denies f/v/n/d, mucousy stools  A&O x4... ambulatory... NAD.

## 2020-02-07 NOTE — ED Provider Notes (Addendum)
[name]    CSN: [id number] Arrival date & time: 02/02/20  1817      History   Chief Complaint Chief Complaint  Patient presents with  . Urinary Tract Infection    HPI Regina Warren is a 25 y.o. female comes to urgent care with a 3-week history of urinary frequency and lower abdominal pain.  Patient describes intermittent symptoms over the period of time.  No flank pain.  No fever or chills.  No nausea or vomiting.  Patient has no vaginal discharge.   Patient has some constipation.   HPI  Past Medical History:  Diagnosis Date  . Abdominal pain   . Anxiety   . Discomfort of back 03/2019  . Vitamin D deficiency 06/2018    Patient Active Problem List   Diagnosis Date Noted  . Runny nose 08/12/2019  . Discomfort of back 04/02/2019  . Intractable headache 07/25/2018  . Right lower quadrant abdominal pain 05/21/2018  . Urinary tract infection without hematuria 05/21/2018  . Pelvic pain 03/30/2018  . Vaginal discharge 03/30/2018  . Elevated BP 03/14/2014    Past Surgical History:  Procedure Laterality Date  . lung puncture      OB History    Gravida  1   Para      Term      Preterm      AB      Living  1     SAB      IAB      Ectopic      Multiple      Live Births               Home Medications    Prior to Admission medications   Medication Sig Start Date End Date Taking? Authorizing Provider  benzonatate (TESSALON) 200 MG capsule Take 1 capsule (200 mg total) by mouth every 8 (eight) hours. 12/16/19   Belinda Fisher, PA-C  hydrocortisone (ANUSOL-HC) 25 MG suppository Place 1 suppository (25 mg total) rectally 2 (two) times daily. 02/06/20   Merrilee Jansky, MD  lidocaine (XYLOCAINE) 2 % solution 5-15 mL gargle as needed 12/16/19   Cathie Hoops, Amy V, PA-C  nitrofurantoin, macrocrystal-monohydrate, (MACROBID) 100 MG capsule Take 1 capsule (100 mg total) by mouth 2 (two) times daily. 01/06/20   Frederica Kuster, MD  polyethylene  glycol (MIRALAX) 17 g packet Take 17 g by mouth daily. 02/06/20   Lamptey, Britta Mccreedy, MD  senna-docusate (SENOKOT-S) 8.6-50 MG tablet Take 1 tablet by mouth 2 (two) times daily. 02/06/20 03/07/20  Merrilee Jansky, MD    Family History Family History  Problem Relation Age of Onset  . Colon cancer Maternal Grandmother   . Breast cancer Paternal Grandmother   . Mental illness Neg Hx     Social History Social History   Tobacco Use  . Smoking status: Never Smoker  . Smokeless tobacco: Never Used  Vaping Use  . Vaping Use: Never used  Substance Use Topics  . Alcohol use: Yes    Comment: occasionally  . Drug use: Never     Allergies   Imitrex [sumatriptan]   Review of Systems Review of Systems  Gastrointestinal: Negative for nausea and vomiting.  Genitourinary: Positive for frequency and urgency. Negative for dysuria, flank pain and vaginal discharge.  Musculoskeletal: Negative.   Neurological: Negative.  Negative for dizziness and headaches.     Physical Exam Triage Vital Signs ED Triage Vitals  Enc Vitals Group  BP 02/02/20 1939 (!) 144/88     Pulse Rate 02/02/20 1939 92     Resp 02/02/20 1939 18     Temp 02/02/20 1939 98.1 F (36.7 C)     Temp Source 02/02/20 1939 Oral     SpO2 02/02/20 1939 98 %     Weight --      Height --      Head Circumference --      Peak Flow --      Pain Score 02/02/20 1850 6     Pain Loc --      Pain Edu? --      Excl. in GC? --    No data found.  Updated Vital Signs BP 123/86 (BP Location: Right Wrist)   Pulse 92   Temp 98.1 F (36.7 C) (Oral)   Resp 18   LMP 01/09/2020   SpO2 98%   Visual Acuity Right Eye Distance:   Left Eye Distance:   Bilateral Distance:    Right Eye Near:   Left Eye Near:    Bilateral Near:     Physical Exam Vitals and nursing note reviewed.  Constitutional:      General: She is not in acute distress.    Appearance: She is not ill-appearing.  Cardiovascular:     Rate and Rhythm: Normal  rate and regular rhythm.  Pulmonary:     Effort: Pulmonary effort is normal.     Breath sounds: Normal breath sounds.  Abdominal:     General: Bowel sounds are normal.     Palpations: Abdomen is soft.     Tenderness: There is abdominal tenderness. There is no guarding or rebound.  Neurological:     Mental Status: She is alert.      UC Treatments / Results  Labs (all labs ordered are listed, but only abnormal results are displayed) Labs Reviewed  POCT URINALYSIS DIP (MANUAL ENTRY) - Abnormal; Notable for the following components:      Result Value   Ketones, POC UA trace (5) (*)    All other components within normal limits  URINE CULTURE  POCT URINE PREGNANCY  CERVICOVAGINAL ANCILLARY ONLY    EKG   Radiology No results found.  Procedures Procedures (including critical care time)  Medications Ordered in UC Medications - No data to display  Initial Impression / Assessment and Plan / UC Course  I have reviewed the triage vital signs and the nursing notes.  Pertinent labs & imaging results that were available during my care of the patient were reviewed by me and considered in my medical decision making (see chart for details).     1.  Bilateral lower abdominal pain Point-of-care urinalysis is negative for UTI Patient is advised to increase oral fluid intake Please take MiraLAX as needed for constipation. Return to urgent care if symptoms worsen. Final Clinical Impressions(s) / UC Diagnoses   Final diagnoses:  Bilateral lower abdominal discomfort     Discharge Instructions     Sign up for my chart We will call you if your labs are abnormal Increase your oral fluid intake     ED Prescriptions    None     PDMP not reviewed this encounter.   Merrilee Jansky, MD 02/07/20 1007    Merrilee Jansky, MD 02/07/20 1009

## 2020-02-07 NOTE — ED Provider Notes (Signed)
[name]    CSN: [id number] Arrival date & time: 02/06/20  1733      History   Chief Complaint Chief Complaint  Patient presents with  . Constipation    HPI Regina Warren is a 25 y.o. female comes to the urgent care with complaints of blood in stool.  Symptoms started a couple of weeks ago.  Patient endorses a history of constipation.  Patient said blood is on the stool and also sprays the toilet bowl sometimes.  She feels like a mass protruding into the rectum when she is having a bowel movement.  She has some pain when having bowel movements.  Patient has tried MiraLAX inconsistently to help with constipation.  Patient also complains of painful swelling in the umbilical area.  No abdominal distention.  Pain is intermittent and on occasion sharp.  No nausea, vomiting or abdominal distention.  HPI  Past Medical History:  Diagnosis Date  . Abdominal pain   . Anxiety   . Discomfort of back 03/2019  . Vitamin D deficiency 06/2018    Patient Active Problem List   Diagnosis Date Noted  . Runny nose 08/12/2019  . Discomfort of back 04/02/2019  . Intractable headache 07/25/2018  . Right lower quadrant abdominal pain 05/21/2018  . Urinary tract infection without hematuria 05/21/2018  . Pelvic pain 03/30/2018  . Vaginal discharge 03/30/2018  . Elevated BP 03/14/2014    Past Surgical History:  Procedure Laterality Date  . lung puncture      OB History    Gravida  1   Para      Term      Preterm      AB      Living  1     SAB      IAB      Ectopic      Multiple      Live Births               Home Medications    Prior to Admission medications   Medication Sig Start Date End Date Taking? Authorizing Provider  hydrocortisone (ANUSOL-HC) 25 MG suppository Place 1 suppository (25 mg total) rectally 2 (two) times daily. 02/06/20  Yes Elysse Polidore, Britta Mccreedy, MD  polyethylene glycol (MIRALAX) 17 g packet Take 17 g by mouth daily.  02/06/20  Yes Samani Deal, Britta Mccreedy, MD  senna-docusate (SENOKOT-S) 8.6-50 MG tablet Take 1 tablet by mouth 2 (two) times daily. 02/06/20 03/07/20 Yes Shalayah Beagley, Britta Mccreedy, MD  benzonatate (TESSALON) 200 MG capsule Take 1 capsule (200 mg total) by mouth every 8 (eight) hours. 12/16/19   Cathie Hoops, Amy V, PA-C  lidocaine (XYLOCAINE) 2 % solution 5-15 mL gargle as needed 12/16/19   Cathie Hoops, Amy V, PA-C  nitrofurantoin, macrocrystal-monohydrate, (MACROBID) 100 MG capsule Take 1 capsule (100 mg total) by mouth 2 (two) times daily. 01/06/20   Frederica Kuster, MD    Family History Family History  Problem Relation Age of Onset  . Colon cancer Maternal Grandmother   . Breast cancer Paternal Grandmother   . Mental illness Neg Hx     Social History Social History   Tobacco Use  . Smoking status: Never Smoker  . Smokeless tobacco: Never Used  Vaping Use  . Vaping Use: Never used  Substance Use Topics  . Alcohol use: Yes    Comment: occasionally  . Drug use: Never     Allergies   Imitrex [sumatriptan]   Review of Systems Review of Systems  Gastrointestinal: Positive for abdominal pain, blood in stool, constipation and rectal pain. Negative for abdominal distention, nausea and vomiting.  Genitourinary: Negative.   Musculoskeletal: Negative.   Neurological: Negative.      Physical Exam Triage Vital Signs ED Triage Vitals  Enc Vitals Group     BP 02/06/20 1857 134/83     Pulse Rate 02/06/20 1857 84     Resp 02/06/20 1857 20     Temp 02/06/20 1857 98.5 F (36.9 C)     Temp Source 02/06/20 1857 Oral     SpO2 02/06/20 1857 98 %     Weight --      Height --      Head Circumference --      Peak Flow --      Pain Score 02/06/20 1838 8     Pain Loc --      Pain Edu? --      Excl. in GC? --    No data found.  Updated Vital Signs BP 134/83 (BP Location: Left Arm)   Pulse 84   Temp 98.5 F (36.9 C) (Oral)   Resp 20   LMP 01/09/2020   SpO2 98%   Visual Acuity Right Eye Distance:   Left  Eye Distance:   Bilateral Distance:    Right Eye Near:   Left Eye Near:    Bilateral Near:     Physical Exam Vitals and nursing note reviewed.  Constitutional:      General: She is not in acute distress.    Appearance: She is not ill-appearing.  Cardiovascular:     Rate and Rhythm: Normal rate and regular rhythm.  Pulmonary:     Effort: Pulmonary effort is normal.     Breath sounds: Normal breath sounds.  Abdominal:     General: Abdomen is flat. Bowel sounds are normal.     Palpations: There is mass.     Tenderness: There is abdominal tenderness. There is no guarding or rebound.     Hernia: A hernia is present.  Musculoskeletal:        General: Normal range of motion.  Neurological:     Mental Status: She is alert.      UC Treatments / Results  Labs (all labs ordered are listed, but only abnormal results are displayed) Labs Reviewed - No data to display  EKG   Radiology No results found.  Procedures Procedures (including critical care time)  Medications Ordered in UC Medications - No data to display  Initial Impression / Assessment and Plan / UC Course  I have reviewed the triage vital signs and the nursing notes.  Pertinent labs & imaging results that were available during my care of the patient were reviewed by me and considered in my medical decision making (see chart for details).    1.  Bleeding grade 2 hemorrhoids: Senokot 1 tablet twice daily MiraLAX daily Stop MiraLAX if you develop diarrhea General surgery referral if symptoms persist Tylenol as needed for pain Anusol suppositories Increase fruit and vegetable/fiber intake  2.  Periumbilical hernia, not incarcerated: General surgery referral information was given to the patient Return to ED precautions given. Final Clinical Impressions(s) / UC Diagnoses   Final diagnoses:  Constipation, unspecified constipation type  Bleeding grade II hemorrhoids  Periumbilical hernia     Discharge  Instructions     Please take medications as directed Increase fruit and vegetable intake Take laxatives regularly Please call to make an appointment with the surgery office.  If symptoms worsen,please return to urgent care to be re-evaluated   ED Prescriptions    Medication Sig Dispense Auth. Provider   hydrocortisone (ANUSOL-HC) 25 MG suppository Place 1 suppository (25 mg total) rectally 2 (two) times daily. 12 suppository Kemontae Dunklee, Britta Mccreedy, MD   senna-docusate (SENOKOT-S) 8.6-50 MG tablet Take 1 tablet by mouth 2 (two) times daily. 60 tablet Damar Petit, Britta Mccreedy, MD   polyethylene glycol (MIRALAX) 17 g packet Take 17 g by mouth daily. 14 each Enriqueta Augusta, Britta Mccreedy, MD     PDMP not reviewed this encounter.   Merrilee Jansky, MD 02/07/20 1240

## 2020-02-24 ENCOUNTER — Other Ambulatory Visit: Payer: Self-pay

## 2020-02-24 ENCOUNTER — Ambulatory Visit (INDEPENDENT_AMBULATORY_CARE_PROVIDER_SITE_OTHER): Payer: Medicaid Other | Admitting: Family Medicine

## 2020-02-24 ENCOUNTER — Encounter: Payer: Self-pay | Admitting: Family Medicine

## 2020-02-24 VITALS — BP 132/83 | HR 86 | Ht 64.0 in | Wt 162.0 lb

## 2020-02-24 DIAGNOSIS — Z09 Encounter for follow-up examination after completed treatment for conditions other than malignant neoplasm: Secondary | ICD-10-CM

## 2020-02-24 DIAGNOSIS — R519 Headache, unspecified: Secondary | ICD-10-CM | POA: Diagnosis not present

## 2020-02-24 DIAGNOSIS — K429 Umbilical hernia without obstruction or gangrene: Secondary | ICD-10-CM

## 2020-02-24 DIAGNOSIS — F419 Anxiety disorder, unspecified: Secondary | ICD-10-CM

## 2020-02-24 NOTE — Progress Notes (Signed)
Wants to discuss hernia and headaches.

## 2020-02-24 NOTE — Progress Notes (Signed)
[facility]   Hospital Follow Up  Subjective:  Patient ID: Regina Warren, female    DOB: [birth year]  Age: 25 y.o. MRN: [id number]  CC:  Chief Complaint  Patient presents with  . Hospitalization Follow-up   HPI Regina Warren is a 25 year old female who presents for Follow up today.   Patient Active Problem List   Diagnosis Date Noted  . Runny nose 08/12/2019  . Discomfort of back 04/02/2019  . Intractable headache 07/25/2018  . Right lower quadrant abdominal pain 05/21/2018  . Urinary tract infection without hematuria 05/21/2018  . Pelvic pain 03/30/2018  . Vaginal discharge 03/30/2018  . Elevated BP 03/14/2014   Current Status: Since her last office visit, she has c/o increasing frequency of headaches. She denies fevers, chills, fatigue, recent infections, weight loss, and night sweats. She has not had any visual changes, dizziness, and falls. No chest pain, heart palpitations, cough and shortness of breath reported. Denies GI problems such as nausea, vomiting, diarrhea, and constipation. She has no reports of blood in stools, dysuria and hematuria. No depression or anxiety reported today. She is taking all medications as prescribed. She denies pain today.   Past Medical History:  Diagnosis Date  . Abdominal pain   . Anxiety   . Discomfort of back 03/2019  . Vitamin D deficiency 06/2018    Past Surgical History:  Procedure Laterality Date  . lung puncture      Family History  Problem Relation Age of Onset  . Colon cancer Maternal Grandmother   . Breast cancer Paternal Grandmother   . Mental illness Neg Hx     Social History   Socioeconomic History  . Marital status: Single    Spouse name: Not on file  . Number of children: Not on file  . Years of education: Not on file  . Highest education level: Not on file  Occupational History  . Not on file  Tobacco Use  . Smoking status: Never Smoker   . Smokeless tobacco: Never Used  Vaping Use  . Vaping Use: Never used  Substance and Sexual Activity  . Alcohol use: Yes    Comment: occasionally  . Drug use: Never  . Sexual activity: Yes    Birth control/protection: None, Condom  Other Topics Concern  . Not on file  Social History Narrative   ** Merged History Encounter **       Social Determinants of Health   Financial Resource Strain: Not on file  Food Insecurity: Not on file  Transportation Needs: Not on file  Physical Activity: Not on file  Stress: Not on file  Social Connections: Not on file  Intimate Partner Violence: Not on file    Outpatient Medications Prior to Visit  Medication Sig Dispense Refill  . acetaminophen (TYLENOL) 325 MG tablet Take 650 mg by mouth every 6 (six) hours as needed.    . naproxen sodium (ALEVE) 220 MG tablet Take 220 mg by mouth.    . benzonatate (TESSALON) 200 MG capsule Take 1 capsule (200 mg total) by mouth every 8 (eight) hours. (Patient not taking: Reported on 02/24/2020) 21 capsule 0  . hydrocortisone (ANUSOL-HC) 25 MG suppository Place 1 suppository (25 mg total) rectally 2 (two) times daily. (Patient not taking: Reported on 02/24/2020) 12 suppository 0  . lidocaine (XYLOCAINE) 2 % solution 5-15 mL gargle as needed (Patient not taking: Reported on 02/24/2020) 150 mL 0  . nitrofurantoin, macrocrystal-monohydrate, (  MACROBID) 100 MG capsule Take 1 capsule (100 mg total) by mouth 2 (two) times daily. (Patient not taking: Reported on 02/24/2020) 10 capsule 0  . polyethylene glycol (MIRALAX) 17 g packet Take 17 g by mouth daily. (Patient not taking: Reported on 02/24/2020) 14 each 0  . senna-docusate (SENOKOT-S) 8.6-50 MG tablet Take 1 tablet by mouth 2 (two) times daily. (Patient not taking: Reported on 02/24/2020) 60 tablet 0   No facility-administered medications prior to visit.    Allergies  Allergen Reactions  . Imitrex [Sumatriptan]     "made my brain feel like it was about to explode"     ROS Review of Systems  Constitutional: Negative.   HENT: Negative.   Eyes: Negative.   Respiratory: Negative.   Cardiovascular: Negative.   Gastrointestinal: Positive for abdominal pain (r/t umbilical hernia).       Abdominal Hernia  Endocrine: Negative.   Genitourinary: Negative.   Musculoskeletal: Negative.   Skin: Negative.   Allergic/Immunologic: Negative.   Neurological: Positive for dizziness (occasional ) and headaches (occasional ).  Hematological: Negative.   Psychiatric/Behavioral: Negative.       Objective:    Physical Exam Vitals and nursing note reviewed.  Constitutional:      Appearance: Normal appearance.  HENT:     Head: Normocephalic and atraumatic.     Nose: Nose normal.     Mouth/Throat:     Mouth: Mucous membranes are moist.     Pharynx: Oropharynx is clear.  Cardiovascular:     Rate and Rhythm: Normal rate and regular rhythm.     Pulses: Normal pulses.     Heart sounds: Normal heart sounds.  Pulmonary:     Effort: Pulmonary effort is normal.     Breath sounds: Normal breath sounds.  Abdominal:     General: Bowel sounds are normal.     Palpations: Abdomen is soft.  Musculoskeletal:        General: Normal range of motion.     Cervical back: Normal range of motion and neck supple.  Skin:    General: Skin is warm and dry.  Neurological:     General: No focal deficit present.     Mental Status: She is alert and oriented to person, place, and time.  Psychiatric:        Mood and Affect: Mood normal.        Behavior: Behavior normal.        Thought Content: Thought content normal.        Judgment: Judgment normal.     BP 132/83   Pulse 86   Ht 5\' 4"  (1.626 m)   Wt 162 lb (73.5 kg)   SpO2 100%   BMI 27.81 kg/m  Wt Readings from Last 3 Encounters:  02/24/20 162 lb (73.5 kg)  10/28/19 162 lb 9.6 oz (73.8 kg)  10/13/19 160 lb (72.6 kg)     Health Maintenance Due  Topic Date Due  . COVID-19 Vaccine (1) Never done    There are  no preventive care reminders to display for this patient.  Lab Results  Component Value Date   TSH 2.39 05/07/2016   Lab Results  Component Value Date   WBC 3.6 07/25/2019   HGB 12.7 07/25/2019   HCT 38.3 07/25/2019   MCV 96 07/25/2019   PLT 242 07/25/2019   Lab Results  Component Value Date   NA 138 07/25/2019   K 4.2 07/25/2019   CO2 21 07/25/2019   GLUCOSE 90  07/25/2019   BUN 9 07/25/2019   CREATININE 0.70 07/25/2019   BILITOT 0.3 07/25/2019   ALKPHOS 61 07/25/2019   AST 17 07/25/2019   ALT 13 07/25/2019   PROT 6.7 07/25/2019   ALBUMIN 4.1 07/25/2019   CALCIUM 9.1 07/25/2019   ANIONGAP 9 10/07/2017   No results found for: CHOL No results found for: HDL No results found for: LDLCALC No results found for: TRIG No results found for: [facility] Lab Results  Component Value Date   HGBA1C 5.0 04/13/2019      Assessment & Plan:   1. Umbilical hernia without obstruction and without gangrene Referral previously sent by She will contact [facility] for consultation.   2. Intractable headache, unspecified chronicity pattern, unspecified headache type She will continue OTC pain medications as needed.   3. Anxiety  4. Follow up She will follow up in 06/2020 for Annual Physical and Labwork.   No orders of the defined types were placed in this encounter.   No orders of the defined types were placed in this encounter.   Referral Orders  No referral(s) requested today    Raliegh Ip, MSN, ANE, FNP-BC [facility] [address] [place], [place] [address] [phone]- fax  Problem List Items Addressed This Visit      Other   Intractable headache   Relevant Medications   acetaminophen (TYLENOL) 325 MG tablet   naproxen sodium (ALEVE) 220 MG tablet    Other Visit Diagnoses    Umbilical hernia without obstruction and without gangrene    -   Primary   Anxiety       Follow up          No orders of the defined types were placed in this encounter.   Follow-up: No follow-ups on file.    Kallie Locks, FNP

## 2020-03-03 DIAGNOSIS — Z20822 Contact with and (suspected) exposure to covid-19: Secondary | ICD-10-CM | POA: Diagnosis not present

## 2020-03-06 ENCOUNTER — Other Ambulatory Visit: Payer: Self-pay

## 2020-03-06 ENCOUNTER — Ambulatory Visit: Payer: Medicaid Other | Admitting: Family Medicine

## 2020-03-06 VITALS — BP 127/82 | HR 80

## 2020-03-06 DIAGNOSIS — F419 Anxiety disorder, unspecified: Secondary | ICD-10-CM

## 2020-03-06 DIAGNOSIS — Z20822 Contact with and (suspected) exposure to covid-19: Secondary | ICD-10-CM | POA: Diagnosis not present

## 2020-03-06 DIAGNOSIS — Z03818 Encounter for observation for suspected exposure to other biological agents ruled out: Secondary | ICD-10-CM | POA: Diagnosis not present

## 2020-03-06 NOTE — Patient Instructions (Signed)
General Headache Without Cause A headache is pain or discomfort that is felt around the head or neck area. There are many causes and types of headaches. In some cases, the cause may not be found. Follow these instructions at home: Watch your condition for any changes. Let your doctor know about them. Take these steps to help with your condition: Managing pain  Take over-the-counter and prescription medicines only as told by your doctor.  Lie down in a dark, quiet room when you have a headache.  If told, put ice on your head and neck area: ? Put ice in a plastic bag. ? Place a towel between your skin and the bag. ? Leave the ice on for 20 minutes, 2-3 times per day.  If told, put heat on the affected area. Use the heat source that your doctor recommends, such as a moist heat pack or a heating pad. ? Place a towel between your skin and the heat source. ? Leave the heat on for 20-30 minutes. ? Remove the heat if your skin turns bright red. This is very important if you are unable to feel pain, heat, or cold. You may have a greater risk of getting burned.  Keep lights dim if bright lights bother you or make your headaches worse.      Eating and drinking  Eat meals on a regular schedule.  If you drink alcohol: ? Limit how much you use to:  0-1 drink a day for women.  0-2 drinks a day for men. ? Be aware of how much alcohol is in your drink. In the [place] one drink equals one 12 oz bottle of beer (355 mL), one 5 oz glass of wine (148 mL), or one 1 oz glass of hard liquor (44 mL).  Stop drinking caffeine, or reduce how much caffeine you drink. General instructions  Keep a journal to find out if certain things bring on headaches. For example, write down: ? What you eat and drink. ? How much sleep you get. ? Any change to your diet or medicines.  Get a massage or try other ways to relax.  Limit stress.  Sit up straight. Do not tighten (tense) your muscles.  Do not use any  products that contain nicotine or tobacco. This includes cigarettes, e-cigarettes, and chewing tobacco. If you need help quitting, ask your doctor.  Exercise regularly as told by your doctor.  Get enough sleep. This often means 7-9 hours of sleep each night.  Keep all follow-up visits as told by your doctor. This is important.   Contact a doctor if:  Your symptoms are not helped by medicine.  You have a headache that feels different than the other headaches.  You feel sick to your stomach (nauseous) or you throw up (vomit).  You have a fever. Get help right away if:  Your headache gets very bad quickly.  Your headache gets worse after a lot of physical activity.  You keep throwing up.  You have a stiff neck.  You have trouble seeing.  You have trouble speaking.  You have pain in the eye or ear.  Your muscles are weak or you lose muscle control.  You lose your balance or have trouble walking.  You feel like you will pass out (faint) or you pass out.  You are mixed up (confused).  You have a seizure. Summary  A headache is pain or discomfort that is felt around the head or neck area.  There are many   causes and types of headaches. In some cases, the cause may not be found.  Keep a journal to help find out what causes your headaches. Watch your condition for any changes. Let your doctor know about them.  Contact a doctor if you have a headache that is different from usual, or if your headache is not helped by medicine.  Get help right away if your headache gets very bad, you throw up, you have trouble seeing, you lose your balance, or you have a seizure. This information is not intended to replace advice given to you by your health care provider. Make sure you discuss any questions you have with your health care provider. Document Revised: 08/03/2017 Document Reviewed: 08/03/2017 Elsevier Patient Education  2021 [employer]  

## 2020-03-06 NOTE — Progress Notes (Signed)
Patient seen today for blood pressure check.  She is experiencing constant headaches and anxiety.  Currently looking for new employment.  Advised patient to schedule follow up to discuss concerns.  She is taking aleve and tylenol for her headaches as well as using peppermint oil.

## 2020-03-07 ENCOUNTER — Telehealth: Payer: Self-pay | Admitting: Family Medicine

## 2020-03-08 NOTE — Telephone Encounter (Signed)
Patient needs note for 02/29/2020 and 03/06/2020.  Contact patient when done.

## 2020-03-09 ENCOUNTER — Other Ambulatory Visit: Payer: Self-pay | Admitting: Family Medicine

## 2020-03-09 NOTE — Telephone Encounter (Signed)
Pt called to give email for provider to email letter / note.  [email or web address]

## 2020-03-09 NOTE — Progress Notes (Signed)
Physician's excuse written today.

## 2020-03-12 ENCOUNTER — Other Ambulatory Visit: Payer: Self-pay

## 2020-03-13 ENCOUNTER — Ambulatory Visit (INDEPENDENT_AMBULATORY_CARE_PROVIDER_SITE_OTHER): Payer: Medicaid Other | Admitting: Gastroenterology

## 2020-03-13 ENCOUNTER — Encounter: Payer: Self-pay | Admitting: Gastroenterology

## 2020-03-13 ENCOUNTER — Encounter: Payer: Self-pay | Admitting: Family Medicine

## 2020-03-13 VITALS — BP 120/70 | HR 70 | Ht 64.0 in | Wt 161.0 lb

## 2020-03-13 DIAGNOSIS — K602 Anal fissure, unspecified: Secondary | ICD-10-CM

## 2020-03-13 DIAGNOSIS — K6289 Other specified diseases of anus and rectum: Secondary | ICD-10-CM

## 2020-03-13 DIAGNOSIS — K625 Hemorrhage of anus and rectum: Secondary | ICD-10-CM | POA: Diagnosis not present

## 2020-03-13 MED ORDER — AMBULATORY NON FORMULARY MEDICATION: 1 refills | Status: DC

## 2020-03-13 NOTE — Progress Notes (Signed)
[facility] Consult Note:  History: Regina Warren 03/13/2020  Referring provider: Kallie Locks, FNP  Reason for consult/chief complaint: Blood In Stools (Patient states blood observed on tissue and in toilet x 3 months, BRB and clots)   Subjective  HPI:  Regina Warren is a very pleasant 25 year old woman referred by primary care for about 3 months of rectal bleeding.  It is sometimes on the tissue and other times blood in the toilet bowl and occasionally with small clots.  Seems to be getting worse slowly and is associated with painful defecation.  There have been occasional constipation predating the symptoms, but nothing she considered alarming or need to take particular medicines for.  These days she feels that she has to sit for long time for bowel movement because it is painful to do so. She went to urgent care a few times and was told she might have hemorrhoids and did not have a rectal exam and was prescribed some Preparation H suppository.  She saw primary care and then referred to [place].  She does not have chronic abdominal pain other than some tenderness related to an umbilical hernia for which she is seeing a surgeon next week.   ROS:  Review of Systems  Constitutional: Negative for appetite change and unexpected weight change.  HENT: Negative for mouth sores and voice change.   Eyes: Negative for pain and redness.  Respiratory: Negative for cough and shortness of breath.   Cardiovascular: Negative for chest pain and palpitations.  Genitourinary: Negative for dysuria and hematuria.  Musculoskeletal: Negative for arthralgias and myalgias.  Skin: Negative for pallor and rash.  Neurological: Negative for weakness and headaches.  Hematological: Negative for adenopathy.     Past Medical History: Past Medical History:  Diagnosis Date  . Abdominal pain   . Anxiety   . Discomfort of back 03/2019  . Vitamin D deficiency 06/2018     Past Surgical  History: Past Surgical History:  Procedure Laterality Date  . lung puncture       Family History: Family History  Problem Relation Age of Onset  . Colon cancer Maternal Grandmother   . Breast cancer Paternal Grandmother   . Mental illness Neg Hx     Social History: Social History   Socioeconomic History  . Marital status: Single    Spouse name: Not on file  . Number of children: Not on file  . Years of education: Not on file  . Highest education level: Not on file  Occupational History  . Not on file  Tobacco Use  . Smoking status: Never Smoker  . Smokeless tobacco: Never Used  Vaping Use  . Vaping Use: Never used  Substance and Sexual Activity  . Alcohol use: Yes    Comment: occasionally  . Drug use: Never  . Sexual activity: Yes    Birth control/protection: None, Condom  Other Topics Concern  . Not on file  Social History Narrative   ** Merged History Encounter **       Social Determinants of Health   Financial Resource Strain: Not on file  Food Insecurity: Not on file  Transportation Needs: Not on file  Physical Activity: Not on file  Stress: Not on file  Social Connections: Not on file    Allergies: Allergies  Allergen Reactions  . Imitrex [Sumatriptan]     "made my brain feel like it was about to explode"    Outpatient Meds: Current Outpatient Medications  Medication Sig Dispense  Refill  . acetaminophen (TYLENOL) 325 MG tablet Take 650 mg by mouth every 6 (six) hours as needed.    . naproxen sodium (ALEVE) 220 MG tablet Take 220 mg by mouth.     No current facility-administered medications for this visit.      ___________________________________________________________________ Objective   Exam:  BP 120/70   Pulse 70   Ht 5\' 4"  (1.626 m)   Wt 161 lb (73 kg)   LMP 03/10/2020 (Approximate)   BMI 27.64 kg/m  Wt Readings from Last 3 Encounters:  03/13/20 161 lb (73 kg)  02/24/20 162 lb (73.5 kg)  10/28/19 162 lb 9.6 oz (73.8 kg)    Exam chaperoned by our CMA June   General: Well-appearing  Eyes: sclera anicteric, no redness  ENT: oral mucosa moist without lesions, no cervical or supraclavicular lymphadenopathy  CV: RRR without murmur, S1/S2, no JVD, no peripheral edema  Resp: clear to auscultation bilaterally, normal RR and effort noted  GI: soft, + tenderness just to the right of a reducible umbilical hernia, with active bowel sounds. No guarding or palpable organomegaly noted.  Skin; warm and dry, no rash or jaundice noted  Neuro: awake, alert and oriented x 3. Normal gross motor function and fluent speech Rectal: Tender posterior anal fissure, limits DRE  Labs:  No CBC since June 2021 (normal at that time) (Regina Warren says she is going to primary care next week for routine visit and expects to have lab work done)  Assessment: Encounter Diagnoses  Name Primary?  July 2021 Anal fissure Yes  . Anal pain   . Rectal bleeding     She has a posterior anal fissure that most likely explains all symptoms, though the degree of bleeding she is describing with intermittent clots is more than would usually expect from that.  She might also have internal hemorrhoids.Regina Warren had 1 child by vaginal delivery in 2017, no episiotomy or tear.   Plan:  MiraLAX daily Combination RectiCare and nitroglycerin ointment 3 times daily Follow-up in 6 weeks.  If fissure is improved but bleeding continues, will schedule colonoscopy  Thank you for the courtesy of this consult.  Please call me with any questions or concerns.  2018 III  CC: Referring provider noted above

## 2020-03-13 NOTE — Patient Instructions (Signed)
If you are age 27 or older, your body mass index should be between 23-30. Your Body mass index is 27.64 kg/m. If this is out of the aforementioned range listed, please consider follow up with your Primary Care Provider.  If you are age 72 or younger, your body mass index should be between 19-25. Your Body mass index is 27.64 kg/m. If this is out of the aformentioned range listed, please consider follow up with your Primary Care Provider.   Please purchase over the counter Recticare and apply 3 times a day as needed.   Please start a daily use of 1 capful of Miralax   [facility] information is below: Address: [address], [place], [place] [address]  [phone]  *Please DO NOT go directly from our office to pick up this medication! Give the pharmacy 1 day to process the prescription as this is compounded and takes time to make.   Patient Drug Education for Nitroglycerin Ointment  Nitroglycerin ointment (NTG) is used to help heal anal fissures. The ointment relaxes the smooth muscle around the anus and promotes blood flow which helps heal the fissure (tear). The NTG reduces anal canal pressure, which diminishes pain and spasm. We use a diluted concentration of NTG (.125%) compared to the 2% that is typically used for heart patients, and this is why you need to obtain the medication from a pharmacy which will compound your prescription.  The NTG ointment should be applied 3 times per day, or as directed.  A pea-sized drop should be placed on the tip of your finger and then gently placed inside the anus. The finger should be inserted 1/3 - 1/2 its length and may be covered with a plastic glove or finger cot. You may use Vaseline to help coat the finger or dilute the ointment. If you are advised to mix the NTG with steroid ointment, limit the steroids to one to two weeks.  The first few applications should be taken lying down, as mild light-headedness or a brief headache may  occur.  It may take several weeks for the fissure to begin healing, and you will need to continue taking the medication after resolution of your symptoms.  It is important to continue the treatment for the entire time period - up to 3 months or as directed. It takes up to two years for the healing tissue to regain the normal skin strength. You will be advised to add fiber to your diet, increase water intake to 7-8 glasses per day, take relaxing baths or sitz baths, and avoid prolonged sitting and straining on the commode. Local anesthetic ointment may be added.  Initially, the anal fissure is very inflamed, which allows more of the NTG to get into the blood. This allows for a higher incidence of the most common side effect - a headache. It is usually brief and mild, but may require Tylenol or Advil. You may dilute the NTG further with Vaseline to decrease the headaches. As the treatment progresses and the fissure begins to heal, the headaches will tend to dissipate. Other side effects include lightheadedness, flushing, dizziness, nervousness, nausea, and vomiting. If any of these side effects persist or worsen, notify us promptly. Stop using the NTG and notify us immediately if you develop the rare side effects of severe dizziness, fainting, fast/pounding heartbeat, paleness, sweating, blurred vision, dry mouth, dark urine, bluish lips/skin/nails, unusual tiredness, severe weakness, irregular heartbeat, seizures, or chest pain. Serious allergic reactions are unusual, but seek  immediate medical attention if you develop a rash, swelling, dizziness, or trouble breathing.  Tell us if you are allergic to nitrates, have severe anemia, low blood pressure, dehydration, chronic heart failure, cardiomyopathy, recent heart attack, increased pressure in the brain, or exposure to nitrates while on the job. Do not use NTG while driving or working around machinery if you are drowsy, dizzy, have lightheadedness, or blurred  vision. Limit alcoholic beverages. To minimize dizziness and lightheadedness, get up slowly when rising from a sitting or lying position. The elderly may be more prone to dizziness and falling. While there are not adequate studies to confirm the safety of NTG in pregnant or breast feeding women, it has been used without incident so far. We recommend waiting at least one hour after applying the NTG ointment before breast feeding.  Do not use NTG ointment if you are taking drugs for sexual problems [e.g., sildenafil (Viagra), tadalafil (Cialis), vardenafil (Levitra)]. Use caution before taking cough-and-cold products, diet aids, or NSAIDs preparations because they may contain ingredients that could increase your blood pressure, cause a fast heartbeat, or increase chest pain (e.g., pseudoephedrine, phenylephrine, chlorpheniramine, diphenhydramine, clemastine, ibuprofen, and naproxen). Tell us if you drink alcohol, take alteplase, migraine drugs (ergotamine), water pills/diuretics such as furosemide or hydrochlorothiazide, or other drugs for high blood pressure (beta blockers, calcium channel blockers, ACE inhibitors).  Store the NTG at room temperature and keep away from light and moisture. Close the container tightly after each use. Do not store in the bathroom. Keep away from children and pets. If you have any questions or problems please call us at  [phone].

## 2020-03-13 NOTE — Telephone Encounter (Addendum)
Printed and emailed letter with corrected date of visit per pt and provider request

## 2020-03-16 ENCOUNTER — Ambulatory Visit (INDEPENDENT_AMBULATORY_CARE_PROVIDER_SITE_OTHER): Payer: Medicaid Other | Admitting: Family Medicine

## 2020-03-16 ENCOUNTER — Encounter: Payer: Self-pay | Admitting: Family Medicine

## 2020-03-16 ENCOUNTER — Other Ambulatory Visit: Payer: Self-pay

## 2020-03-16 VITALS — BP 135/85 | HR 79 | Temp 97.0°F | Ht 64.5 in | Wt 162.0 lb

## 2020-03-16 DIAGNOSIS — Z09 Encounter for follow-up examination after completed treatment for conditions other than malignant neoplasm: Secondary | ICD-10-CM

## 2020-03-16 DIAGNOSIS — R519 Headache, unspecified: Secondary | ICD-10-CM | POA: Diagnosis not present

## 2020-03-16 DIAGNOSIS — K602 Anal fissure, unspecified: Secondary | ICD-10-CM

## 2020-03-16 DIAGNOSIS — K429 Umbilical hernia without obstruction or gangrene: Secondary | ICD-10-CM | POA: Diagnosis not present

## 2020-03-16 NOTE — Progress Notes (Signed)
[facility]   Sick Visit  Subjective:  Patient ID: Regina Warren, female    DOB: Apr 23, 1995  Age: 25 y.o. MRN: [id number]  CC:  Chief Complaint  Patient presents with  . Follow-up    Dicuss her hdg level  having bleeding stool , pt follow up with gi on on the  week , pt also want have other labs done also while she was here at this visit , hernia     HPI Regina Warren is a 25 year old female who presents for Follow Up.    Patient Active Problem List   Diagnosis Date Noted  . Runny nose 08/12/2019  . Discomfort of back 04/02/2019  . Intractable headache 07/25/2018  . Right lower quadrant abdominal pain 05/21/2018  . Urinary tract infection without hematuria 05/21/2018  . Pelvic pain 03/30/2018  . Vaginal discharge 03/30/2018  . Elevated BP 03/14/2014   Current Status: Since her last office visit, she is doing well with no complaints.  She recently follow up with GI for scant blood in her stools and was diagnosed with Anal Fissure. She has complaints of increased fatigue lately. She denies fevers, chills, recent infections, weight loss, and night sweats. She has not had any headaches, visual changes, dizziness, and falls. No chest pain, heart palpitations, cough and shortness of breath reported. Denies GI problems such as nausea, vomiting, diarrhea, and constipation. She has no reports of blood in stools, dysuria and hematuria. No depression or anxiety, and denies suicidal ideations, homicidal ideations, or auditory hallucinations. She is taking all medications as prescribed. She denies pain today.   Past Medical History:  Diagnosis Date  . Abdominal pain   . Anxiety   . Discomfort of back 03/2019  . Vitamin D deficiency 06/2018    Past Surgical History:  Procedure Laterality Date  . lung puncture      Family History  Problem Relation Age of Onset  . Colon cancer Maternal Grandmother   . Breast cancer  Paternal Grandmother   . Mental illness Neg Hx     Social History   Socioeconomic History  . Marital status: Single    Spouse name: Not on file  . Number of children: Not on file  . Years of education: Not on file  . Highest education level: Not on file  Occupational History  . Not on file  Tobacco Use  . Smoking status: Never Smoker  . Smokeless tobacco: Never Used  Vaping Use  . Vaping Use: Never used  Substance and Sexual Activity  . Alcohol use: Not Currently    Comment: occasionally  . Drug use: Never  . Sexual activity: Yes    Birth control/protection: None, Condom  Other Topics Concern  . Not on file  Social History Narrative   ** Merged History Encounter **       Social Determinants of Health   Financial Resource Strain: Not on file  Food Insecurity: Not on file  Transportation Needs: Not on file  Physical Activity: Not on file  Stress: Not on file  Social Connections: Not on file  Intimate Partner Violence: Not on file    Outpatient Medications Prior to Visit  Medication Sig Dispense Refill  . acetaminophen (TYLENOL) 325 MG tablet Take 650 mg by mouth every 6 (six) hours as needed.    . AMBULATORY NON FORMULARY MEDICATION Nitroglycerine ointment 0.125 %  Apply a pea sized amount internally three-four times daily. Dispense  30 GM 1 refill 30 g 1  . naproxen sodium (ALEVE) 220 MG tablet Take 220 mg by mouth.     No facility-administered medications prior to visit.    Allergies  Allergen Reactions  . Imitrex [Sumatriptan]     "made my brain feel like it was about to explode"    ROS Review of Systems  Respiratory: Negative.   Cardiovascular: Negative.   Neurological: Positive for dizziness (occasional) and headaches (occasional ).  Psychiatric/Behavioral: Negative.       Objective:    Physical Exam Vitals and nursing note reviewed.  Constitutional:      Appearance: Normal appearance.  Cardiovascular:     Rate and Rhythm: Normal rate and  regular rhythm.     Pulses: Normal pulses.     Heart sounds: Normal heart sounds.  Pulmonary:     Effort: Pulmonary effort is normal.     Breath sounds: Normal breath sounds.  Musculoskeletal:        General: Normal range of motion.  Skin:    General: Skin is warm and dry.  Neurological:     Mental Status: She is alert.  Psychiatric:        Mood and Affect: Mood normal.        Behavior: Behavior normal.        Thought Content: Thought content normal.        Judgment: Judgment normal.     BP 135/85 (BP Location: Left Arm, Patient Position: Sitting, Cuff Size: Normal)   Pulse 79   Temp (!) 97 F (36.1 C) (Temporal)   Ht 5' 4.5" (1.638 m)   Wt 162 lb (73.5 kg)   LMP 03/10/2020 (Approximate)   SpO2 99%   BMI 27.38 kg/m  Wt Readings from Last 3 Encounters:  03/16/20 162 lb (73.5 kg)  03/13/20 161 lb (73 kg)  02/24/20 162 lb (73.5 kg)     Health Maintenance Due  Topic Date Due  . COVID-19 Vaccine (1) Never done    There are no preventive care reminders to display for this patient.  Lab Results  Component Value Date   TSH 2.39 05/07/2016   Lab Results  Component Value Date   WBC 3.6 07/25/2019   HGB 12.7 07/25/2019   HCT 38.3 07/25/2019   MCV 96 07/25/2019   PLT 242 07/25/2019   Lab Results  Component Value Date   NA 138 07/25/2019   K 4.2 07/25/2019   CO2 21 07/25/2019   GLUCOSE 90 07/25/2019   BUN 9 07/25/2019   CREATININE 0.70 07/25/2019   BILITOT 0.3 07/25/2019   ALKPHOS 61 07/25/2019   AST 17 07/25/2019   ALT 13 07/25/2019   PROT 6.7 07/25/2019   ALBUMIN 4.1 07/25/2019   CALCIUM 9.1 07/25/2019   ANIONGAP 9 10/07/2017   No results found for: CHOL No results found for: HDL No results found for: LDLCALC No results found for: TRIG No results found for: [facility] Lab Results  Component Value Date   HGBA1C 5.0 04/13/2019    Assessment & Plan:   1. Anal fissure - Hemoglobin  2. Intractable headache, unspecified chronicity pattern,  unspecified headache type  3. Follow up She will keep follow up appointment.  No orders of the defined types were placed in this encounter.   Orders Placed This Encounter  Procedures  . Hemoglobin    Referral Orders  No referral(s) requested today    Raliegh Ip, MSN, ANE, FNP-BC [facility] [address] [place], [place] [address] [phone] fax   Problem List Items Addressed This Visit      Other   Intractable headache    Other Visit Diagnoses    Anal fissure    -  Primary   Relevant Orders   Hemoglobin   Follow up          No orders of the defined types were placed in this encounter.   Follow-up: No follow-ups on file.    Kallie Locks, FNP

## 2020-03-17 LAB — HEMOGLOBIN: Hemoglobin: 13.2 g/dL (ref 11.1–15.9)

## 2020-03-18 ENCOUNTER — Encounter: Payer: Self-pay | Admitting: Family Medicine

## 2020-03-19 ENCOUNTER — Telehealth: Payer: Self-pay | Admitting: Family Medicine

## 2020-03-19 ENCOUNTER — Other Ambulatory Visit: Payer: Self-pay | Admitting: Family Medicine

## 2020-03-19 ENCOUNTER — Encounter: Payer: Self-pay | Admitting: Family Medicine

## 2020-03-19 DIAGNOSIS — R239 Unspecified skin changes: Secondary | ICD-10-CM

## 2020-03-19 NOTE — Telephone Encounter (Signed)
Pt request dermatology referral to [facility], Dr. Mickel Crow. Fax [phone].  Pt request referral for hyperpigmentation on cheek area.

## 2020-03-22 DIAGNOSIS — L700 Acne vulgaris: Secondary | ICD-10-CM | POA: Diagnosis not present

## 2020-04-03 ENCOUNTER — Other Ambulatory Visit (HOSPITAL_COMMUNITY): Payer: Self-pay | Admitting: General Surgery

## 2020-04-03 ENCOUNTER — Other Ambulatory Visit: Payer: Self-pay | Admitting: General Surgery

## 2020-04-03 DIAGNOSIS — K429 Umbilical hernia without obstruction or gangrene: Secondary | ICD-10-CM

## 2020-04-04 ENCOUNTER — Ambulatory Visit (HOSPITAL_BASED_OUTPATIENT_CLINIC_OR_DEPARTMENT_OTHER)
Admission: RE | Admit: 2020-04-04 | Discharge: 2020-04-04 | Disposition: A | Discharge status: Home / Self Care | Payer: Medicaid Other | Source: Ambulatory Visit | Attending: General Surgery | Admitting: General Surgery

## 2020-04-04 ENCOUNTER — Other Ambulatory Visit: Payer: Self-pay

## 2020-04-04 ENCOUNTER — Other Ambulatory Visit (HOSPITAL_BASED_OUTPATIENT_CLINIC_OR_DEPARTMENT_OTHER): Payer: Self-pay | Admitting: General Surgery

## 2020-04-04 DIAGNOSIS — K429 Umbilical hernia without obstruction or gangrene: Secondary | ICD-10-CM | POA: Insufficient documentation

## 2020-04-04 DIAGNOSIS — R109 Unspecified abdominal pain: Secondary | ICD-10-CM | POA: Diagnosis not present

## 2020-04-27 ENCOUNTER — Encounter: Payer: Self-pay | Admitting: Gastroenterology

## 2020-04-27 ENCOUNTER — Ambulatory Visit (INDEPENDENT_AMBULATORY_CARE_PROVIDER_SITE_OTHER): Payer: Medicaid Other | Admitting: Gastroenterology

## 2020-04-27 VITALS — BP 110/70 | HR 54 | Ht 65.5 in | Wt 162.0 lb

## 2020-04-27 DIAGNOSIS — K602 Anal fissure, unspecified: Secondary | ICD-10-CM

## 2020-04-27 NOTE — Patient Instructions (Signed)
If you are age 27 or older, your body mass index should be between 23-30. Your Body mass index is 26.55 kg/m. If this is out of the aforementioned range listed, please consider follow up with your Primary Care Provider.  If you are age 68 or younger, your body mass index should be between 19-25. Your Body mass index is 26.55 kg/m. If this is out of the aformentioned range listed, please consider follow up with your Primary Care Provider.   Follow up as needed.   It was a pleasure to see you today!  Dr. Myrtie Neither

## 2020-04-27 NOTE — Progress Notes (Signed)
     [facility] Progress Note  Chief Complaint: Anal fissure  Subjective  History:  Seen February 15 for months of rectal pain and bleeding, posterior anal fissure discovered.  Treated with lidocaine and nitroglycerin ointment and MiraLAX.  Regina Warren tells me she only needed the ointments for 2 or 3 weeks, as she was already feeling better within a week.  She is occasional MiraLAX but mostly uses dandelion or "smooth move" tea for relief of occasional constipation. Pain and bleed have completely resolved.  The patient's Past Medical, Family and Social History were reviewed and are on file in the EMR.  Objective:  Med list reviewed  Current Outpatient Medications:  .  acetaminophen (TYLENOL) 325 MG tablet, Take 650 mg by mouth every 6 (six) hours as needed., Disp: , Rfl:  .  AMBULATORY NON FORMULARY MEDICATION, Nitroglycerine ointment 0.125 %  Apply a pea sized amount internally three-four times daily. Dispense 30 GM 1 refill (Patient taking differently: Nitroglycerine ointment 0.125 %  Apply a pea sized amount internally three-four times daily as needed Dispense 30 GM 1 refill), Disp: 30 g, Rfl: 1 .  ibuprofen (ADVIL) 800 MG tablet, Take 800 mg by mouth 3 (three) times daily as needed., Disp: , Rfl:  .  naproxen sodium (ALEVE) 220 MG tablet, Take 220 mg by mouth daily as needed., Disp: , Rfl:    Vital signs in last 24 hrs: Vitals:   04/27/20 1411  BP: 110/70  Pulse: (!) 54   Wt Readings from Last 3 Encounters:  04/27/20 162 lb (73.5 kg)  03/16/20 162 lb (73.5 kg)  03/13/20 161 lb (73 kg)    Physical Exam   Well-appearing  Normal perianal exam.  DRE normal-complete healing of fissure, no tenderness or palpable internal lesions.  No posterior fibrosis in the anal canal.   _____________________________________________ Assessment & Plan  Assessment: Encounter Diagnosis  Name Primary?  Marland Kitchen Anal fissure Yes    Fissure is completely healed.  She will continue her  treatment of occasional constipation that works well for her.  Should she have recurrent symptoms of fissure, she will immediately begin using the lidocaine and nitroglycerin ointment until symptoms have resolved, and call us as needed.   Charlie Pitter III

## 2020-04-30 ENCOUNTER — Ambulatory Visit: Payer: Self-pay | Admitting: Family Medicine

## 2020-05-28 ENCOUNTER — Other Ambulatory Visit: Payer: Self-pay

## 2020-05-28 ENCOUNTER — Ambulatory Visit
Admission: RE | Admit: 2020-05-28 | Discharge: 2020-05-28 | Disposition: A | Discharge status: Home / Self Care | Payer: Medicaid Other | Source: Ambulatory Visit | Attending: Emergency Medicine | Admitting: Emergency Medicine

## 2020-05-28 VITALS — BP 120/84 | HR 75 | Temp 98.6°F | Resp 20

## 2020-05-28 DIAGNOSIS — R519 Headache, unspecified: Secondary | ICD-10-CM | POA: Diagnosis not present

## 2020-05-28 MED ORDER — NAPROXEN 500 MG PO TABS: 500.0000 mg | ORAL_TABLET | Freq: Two times a day (BID) | ORAL | 0 refills | Status: DC

## 2020-05-28 NOTE — ED Provider Notes (Signed)
[name]    CSN: [id number] Arrival date & time: 05/28/20  1615      History   Chief Complaint Chief Complaint  Patient presents with  . Headache  . Appointment    16:00    HPI Regina Warren is a 25 y.o. female presenting today for evaluation of headache.  Reports frontal headache for the past 2 weeks.  Will get some slight relief temporarily from using over-the-counter medicine, but overalls been more persistent than typical headaches.  Denies any associated vision changes, light sensitivity, nausea or vomiting.  Denies any weakness difficulty speaking.  Denies any dizziness or lightheadedness.  Denies associated URI symptoms.  Reports prior improvement with ibuprofen 800, but recently has not been helping.  Reports aversion to injections.  HPI  Past Medical History:  Diagnosis Date  . Abdominal pain   . Anxiety   . Discomfort of back 03/2019  . Vitamin D deficiency 06/2018    Patient Active Problem List   Diagnosis Date Noted  . Runny nose 08/12/2019  . Discomfort of back 04/02/2019  . Intractable headache 07/25/2018  . Right lower quadrant abdominal pain 05/21/2018  . Urinary tract infection without hematuria 05/21/2018  . Pelvic pain 03/30/2018  . Vaginal discharge 03/30/2018  . Elevated BP 03/14/2014    Past Surgical History:  Procedure Laterality Date  . lung puncture      OB History    Gravida  1   Para      Term      Preterm      AB      Living  1     SAB      IAB      Ectopic      Multiple      Live Births               Home Medications    Prior to Admission medications   Medication Sig Start Date End Date Taking? Authorizing Provider  ibuprofen (ADVIL) 800 MG tablet Take 800 mg by mouth 3 (three) times daily as needed. 04/04/20  Yes [provider]  naproxen (NAPROSYN) 500 MG tablet Take 1 tablet (500 mg total) by mouth 2 (two) times daily. 05/28/20  Yes Sola Margolis C, PA-C  acetaminophen  (TYLENOL) 325 MG tablet Take 650 mg by mouth every 6 (six) hours as needed.    [provider]  AMBULATORY NON FORMULARY MEDICATION Nitroglycerine ointment 0.125 %  Apply a pea sized amount internally three-four times daily. Dispense 30 GM 1 refill Patient taking differently: Nitroglycerine ointment 0.125 %  Apply a pea sized amount internally three-four times daily as needed Dispense 30 GM 1 refill 03/13/20   Charlie Pitter III, MD  naproxen sodium (ALEVE) 220 MG tablet Take 220 mg by mouth daily as needed.    [provider]    Family History Family History  Problem Relation Age of Onset  . Colon cancer Maternal Grandmother   . Breast cancer Paternal Grandmother   . Mental illness Neg Hx     Social History Social History   Tobacco Use  . Smoking status: Never Smoker  . Smokeless tobacco: Never Used  Vaping Use  . Vaping Use: Never used  Substance Use Topics  . Alcohol use: Not Currently    Comment: occasionally  . Drug use: Never     Allergies   Imitrex [sumatriptan]   Review of Systems Review of Systems  Constitutional: Negative for fatigue and  fever.  HENT: Negative for congestion, sinus pressure and sore throat.   Eyes: Negative for photophobia, pain and visual disturbance.  Respiratory: Negative for cough and shortness of breath.   Cardiovascular: Negative for chest pain.  Gastrointestinal: Negative for abdominal pain, nausea and vomiting.  Genitourinary: Negative for decreased urine volume and hematuria.  Musculoskeletal: Negative for myalgias, neck pain and neck stiffness.  Neurological: Positive for headaches. Negative for dizziness, syncope, facial asymmetry, speech difficulty, weakness, light-headedness and numbness.     Physical Exam Triage Vital Signs ED Triage Vitals  Enc Vitals Group     BP 05/28/20 1646 120/84     Pulse Rate 05/28/20 1646 75     Resp 05/28/20 1646 20     Temp 05/28/20 1646 98.6 F (37 C)     Temp Source  05/28/20 1646 Oral     SpO2 05/28/20 1646 98 %     Weight --      Height --      Head Circumference --      Peak Flow --      Pain Score 05/28/20 1653 10     Pain Loc --      Pain Edu? --      Excl. in GC? --    No data found.  Updated Vital Signs BP 120/84 (BP Location: Left Arm)   Pulse 75   Temp 98.6 F (37 C) (Oral)   Resp 20   LMP 05/09/2020   SpO2 98%   Visual Acuity Right Eye Distance:   Left Eye Distance:   Bilateral Distance:    Right Eye Near:   Left Eye Near:    Bilateral Near:     Physical Exam Vitals and nursing note reviewed.  Constitutional:      Appearance: She is well-developed.     Comments: No acute distress  HENT:     Head: Normocephalic and atraumatic.     Ears:     Comments: Bilateral ears without tenderness to palpation of external auricle, tragus and mastoid, EAC's without erythema or swelling, TM's with good bony landmarks and cone of light. Non erythematous.     Nose: Nose normal.     Mouth/Throat:     Comments: Oral mucosa pink and moist, no tonsillar enlargement or exudate. Posterior pharynx patent and nonerythematous, no uvula deviation or swelling. Normal phonation. Eyes:     Conjunctiva/sclera: Conjunctivae normal.  Cardiovascular:     Rate and Rhythm: Normal rate.  Pulmonary:     Effort: Pulmonary effort is normal. No respiratory distress.     Comments: Breathing comfortably at rest, CTABL, no wheezing, rales or other adventitious sounds auscultated Abdominal:     General: There is no distension.  Musculoskeletal:        General: Normal range of motion.     Cervical back: Neck supple.  Skin:    General: Skin is warm and dry.  Neurological:     General: No focal deficit present.     Mental Status: She is alert and oriented to person, place, and time. Mental status is at baseline.     Cranial Nerves: No cranial nerve deficit.     Motor: No weakness.     Gait: Gait normal.      UC Treatments / Results  Labs (all labs  ordered are listed, but only abnormal results are displayed) Labs Reviewed - No data to display  EKG   Radiology No results found.  Procedures Procedures (including critical care time)  Medications Ordered in UC Medications - No data to display  Initial Impression / Assessment and Plan / UC Course  I have reviewed the triage vital signs and the nursing notes.  Pertinent labs & imaging results that were available during my care of the patient were reviewed by me and considered in my medical decision making (see chart for details).     Persistent headache-no neurodeficits, no red flags, do not suspect related to any underlying sinus inflammation, offered migraine cocktail, patient wished to defer.  We will treat with Naprosyn as alternative and may use over-the-counter Benadryl.  Follow-up with PCP if continuing to have persistent/recurrent headaches.  Discussed strict return precautions. Patient verbalized understanding and is agreeable with plan.  Final Clinical Impressions(s) / UC Diagnoses   Final diagnoses:  Acute nonintractable headache, unspecified headache type     Discharge Instructions     Naprosyn twice daily with food May use benadryl at bedtime Follow up if not improving or worsening    ED Prescriptions    Medication Sig Dispense Auth. Provider   naproxen (NAPROSYN) 500 MG tablet Take 1 tablet (500 mg total) by mouth 2 (two) times daily. 30 tablet Alice Burnside, [place] C, PA-C     PDMP not reviewed this encounter.   Lew Dawes, [place] 05/28/20 1749

## 2020-05-28 NOTE — ED Triage Notes (Signed)
Headache for 2 weeks.  Denies runny nose, denies ear pain.  Patient has a frontal headache.  Denies sore throat

## 2020-05-28 NOTE — Discharge Instructions (Signed)
Naprosyn twice daily with food May use benadryl at bedtime Follow up if not improving or worsening

## 2020-06-20 ENCOUNTER — Ambulatory Visit (INDEPENDENT_AMBULATORY_CARE_PROVIDER_SITE_OTHER): Payer: Medicaid Other | Admitting: Nurse Practitioner

## 2020-06-20 ENCOUNTER — Other Ambulatory Visit: Payer: Self-pay

## 2020-06-20 DIAGNOSIS — Z113 Encounter for screening for infections with a predominantly sexual mode of transmission: Secondary | ICD-10-CM

## 2020-06-20 DIAGNOSIS — R350 Frequency of micturition: Secondary | ICD-10-CM | POA: Diagnosis not present

## 2020-06-20 LAB — POCT URINALYSIS DIPSTICK
Bilirubin, UA: NEGATIVE
Blood, UA: NEGATIVE
Glucose, UA: NEGATIVE
Ketones, UA: NEGATIVE
Nitrite, UA: NEGATIVE
Protein, UA: NEGATIVE
Spec Grav, UA: 1.025 (ref 1.010–1.025)
Urobilinogen, UA: 0.2 E.U./dL
pH, UA: 7 (ref 5.0–8.0)

## 2020-06-20 NOTE — Progress Notes (Signed)
Patient in to give urine screening for UTI. Sx: full in bladder area, feeling of needing to urinate with little void.  Also requesting to do swab to check for STDs.

## 2020-06-22 DIAGNOSIS — L700 Acne vulgaris: Secondary | ICD-10-CM | POA: Diagnosis not present

## 2020-06-24 LAB — NUSWAB VAGINITIS PLUS (VG+)
Candida albicans, NAA: NEGATIVE
Candida glabrata, NAA: NEGATIVE
Chlamydia trachomatis, NAA: NEGATIVE
Neisseria gonorrhoeae, NAA: NEGATIVE
Trich vag by NAA: NEGATIVE

## 2020-07-11 ENCOUNTER — Other Ambulatory Visit: Payer: Self-pay | Admitting: Nurse Practitioner

## 2020-07-11 MED ORDER — NAPROXEN 500 MG PO TABS: 500.0000 mg | ORAL_TABLET | Freq: Two times a day (BID) | ORAL | 0 refills | Status: DC

## 2020-07-24 ENCOUNTER — Ambulatory Visit: Payer: Medicaid Other | Admitting: Family Medicine

## 2020-07-25 ENCOUNTER — Other Ambulatory Visit: Payer: Self-pay | Admitting: Nurse Practitioner

## 2020-07-25 MED ORDER — NAPROXEN 500 MG PO TABS: 500.0000 mg | ORAL_TABLET | Freq: Two times a day (BID) | ORAL | 0 refills | Status: DC

## 2020-08-14 ENCOUNTER — Other Ambulatory Visit: Payer: Self-pay

## 2020-08-14 MED ORDER — NAPROXEN 500 MG PO TABS: 500.0000 mg | ORAL_TABLET | Freq: Two times a day (BID) | ORAL | 1 refills | Status: DC

## 2020-09-13 ENCOUNTER — Telehealth: Payer: Self-pay

## 2020-09-13 NOTE — Telephone Encounter (Signed)
Pt is asking to speak to you! This is the one that has an appt today!

## 2020-09-14 ENCOUNTER — Encounter: Payer: Medicaid Other | Admitting: Nurse Practitioner

## 2020-09-14 ENCOUNTER — Encounter: Payer: Medicaid Other | Admitting: Family Medicine

## 2020-09-17 ENCOUNTER — Encounter: Payer: Medicaid Other | Admitting: Nurse Practitioner

## 2020-09-17 ENCOUNTER — Other Ambulatory Visit: Payer: Self-pay

## 2020-09-17 ENCOUNTER — Encounter: Payer: Self-pay | Admitting: Nurse Practitioner

## 2020-09-17 ENCOUNTER — Ambulatory Visit (INDEPENDENT_AMBULATORY_CARE_PROVIDER_SITE_OTHER): Payer: Medicaid Other | Admitting: Nurse Practitioner

## 2020-09-17 VITALS — BP 141/86 | HR 72 | Temp 97.9°F | Ht 64.5 in | Wt 162.1 lb

## 2020-09-17 DIAGNOSIS — D519 Vitamin B12 deficiency anemia, unspecified: Secondary | ICD-10-CM | POA: Diagnosis not present

## 2020-09-17 DIAGNOSIS — Z0000 Encounter for general adult medical examination without abnormal findings: Secondary | ICD-10-CM

## 2020-09-17 DIAGNOSIS — R5383 Other fatigue: Secondary | ICD-10-CM

## 2020-09-17 DIAGNOSIS — R531 Weakness: Secondary | ICD-10-CM | POA: Diagnosis not present

## 2020-09-17 DIAGNOSIS — Z113 Encounter for screening for infections with a predominantly sexual mode of transmission: Secondary | ICD-10-CM | POA: Diagnosis not present

## 2020-09-17 DIAGNOSIS — E559 Vitamin D deficiency, unspecified: Secondary | ICD-10-CM | POA: Diagnosis not present

## 2020-09-17 LAB — POCT URINALYSIS DIPSTICK
Bilirubin, UA: NEGATIVE
Blood, UA: NEGATIVE
Glucose, UA: NEGATIVE
Ketones, UA: NEGATIVE
Leukocytes, UA: NEGATIVE
Nitrite, UA: NEGATIVE
Protein, UA: NEGATIVE
Spec Grav, UA: 1.025 (ref 1.010–1.025)
Urobilinogen, UA: 1 E.U./dL
pH, UA: 7 (ref 5.0–8.0)

## 2020-09-17 MED ORDER — IBUPROFEN 800 MG PO TABS: 800.0000 mg | ORAL_TABLET | Freq: Three times a day (TID) | ORAL | 0 refills | Status: DC | PRN

## 2020-09-17 NOTE — Addendum Note (Signed)
Addended by: Eduard Clos on: 09/17/2020 03:10 PM   Modules accepted: Orders

## 2020-09-17 NOTE — Patient Instructions (Addendum)
You were seen today for your annual physical/ pap smear. Your exam was overall reassuring. Labs were collected, with results available via your MyChart within the next week.  Please contact me if you have any questions or your fatigue worsens.

## 2020-09-17 NOTE — Progress Notes (Signed)
[facility] Patient [facility] [address] [place], [place]  [address] Phone:  [phone]   Fax:  [phone] Subjective:   Patient ID: Regina Warren, female    DOB: 11/26/1995, 25 y.o.   MRN: [id number]  Chief Complaint  Patient presents with   Gynecologic Exam    Requesting labs; requesting refill ibuprofen 800mg .    25 y/o female to the clinic for   Gynecologic Exam The patient's pertinent negatives include no genital itching, genital lesions, genital odor, missed menses, pelvic pain, vaginal bleeding or vaginal discharge. The patient is experiencing no pain. Pertinent negatives include no abdominal pain, back pain, chills, constipation, diarrhea, dysuria, fever, flank pain, nausea or vomiting. She is sexually active. No, her partner does not have an STD. She uses nothing for contraception. Her menstrual history has been regular (last menstrual lighter than normal, but same duration). There is no history of an STD or vaginosis.  Patient states that she has had one partner in the past 6 mths, protected sex. Indicates that she has some increased stress this week related to returning to work. 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 Checks blood pressure regularly at home and is typically 120/80. Suspects elevation in b/p related to recent stressors.  Also endorses having some increased fatigue that began recently. Fatigue is more pronounced in the morning and early afternoon.Currently takes a multivitamin, which is somewhat helpful. Denies any chest pain, fever, shortness of breath, HA or dizziness.     Past Medical History:  Diagnosis Date   Abdominal pain    Anxiety    Discomfort of back 03/2019   Vitamin D deficiency 06/2018    Past Surgical History:  Procedure Laterality Date   lung puncture      Family History  Problem Relation Age of Onset   Colon cancer Maternal Grandmother    Breast cancer Paternal Grandmother    Mental illness  Neg Hx     Social History   Socioeconomic History   Marital status: Single    Spouse name: Not on file   Number of children: 1   Years of education: Not on file   Highest education level: Not on file  Occupational History   Occupation: case worker  Tobacco Use   Smoking status: Never   Smokeless tobacco: Never  Vaping Use   Vaping Use: Never used  Substance and Sexual Activity   Alcohol use: Not Currently    Comment: occasionally   Drug use: Never   Sexual activity: Yes    Birth control/protection: None, Condom  Other Topics Concern   Not on file  Social History Narrative   ** Merged History Encounter **       Social Determinants of Health   Financial Resource Strain: Not on file  Food Insecurity: Not on file  Transportation Needs: Not on file  Physical Activity: Not on file  Stress: Not on file  Social Connections: Not on file  Intimate Partner Violence: Not on file    Outpatient Medications Prior to Visit  Medication Sig Dispense Refill   ibuprofen (ADVIL) 800 MG tablet Take 800 mg by mouth 3 (three) times daily as needed.     AMBULATORY NON FORMULARY MEDICATION Nitroglycerine ointment 0.125 %  Apply a pea sized amount internally three-four times daily. Dispense 30 GM 1 refill (Patient taking differently: Nitroglycerine ointment 0.125 %  Apply a pea sized amount internally three-four times daily as needed Dispense 30 GM 1 refill)  30 g 1   benzoyl peroxide-erythromycin (BENZAMYCIN) gel Apply 1 a small amount to affected area once a day     acetaminophen (TYLENOL) 325 MG tablet Take 650 mg by mouth every 6 (six) hours as needed.     naproxen (NAPROSYN) 500 MG tablet Take 1 tablet (500 mg total) by mouth 2 (two) times daily. 30 tablet 1   naproxen sodium (ALEVE) 220 MG tablet Take 220 mg by mouth daily as needed.     No facility-administered medications prior to visit.    Allergies  Allergen Reactions   Imitrex [Sumatriptan]     "made my brain feel like it  was about to explode"    Review of Systems  Constitutional:  Positive for malaise/fatigue. Negative for chills and fever.  Respiratory:  Negative for cough and shortness of breath.   Cardiovascular:  Negative for chest pain, palpitations and leg swelling.  Gastrointestinal:  Negative for abdominal pain, blood in stool, constipation, diarrhea, nausea and vomiting.  Genitourinary:  Negative for dysuria, flank pain, missed menses, pelvic pain and vaginal discharge.  Musculoskeletal:  Negative for back pain.  Skin: Negative.   Neurological: Negative.   Psychiatric/Behavioral:  Negative for depression. The patient is not nervous/anxious.   All other systems reviewed and are negative.     Objective:    Physical Exam Constitutional:      General: She is not in acute distress.    Appearance: Normal appearance.  HENT:     Head: Normocephalic.  Cardiovascular:     Rate and Rhythm: Normal rate and regular rhythm.     Pulses: Normal pulses.     Heart sounds: Normal heart sounds.     Comments: No obvious peripheral edema Pulmonary:     Effort: Pulmonary effort is normal.     Breath sounds: Normal breath sounds.  Skin:    General: Skin is warm and dry.     Capillary Refill: Capillary refill takes less than 2 seconds.  Neurological:     General: No focal deficit present.     Mental Status: She is alert and oriented to person, place, and time.  Psychiatric:        Mood and Affect: Mood normal.        Behavior: Behavior normal.        Thought Content: Thought content normal.        Judgment: Judgment normal.    BP (!) 141/86   Pulse 72   Temp 97.9 F (36.6 C)   Ht 5' 4.5" (1.638 m)   Wt 162 lb 0.8 oz (73.5 kg)   LMP 09/12/2020   SpO2 98%   BMI 27.39 kg/m  Wt Readings from Last 3 Encounters:  09/17/20 162 lb 0.8 oz (73.5 kg)  04/27/20 162 lb (73.5 kg)  03/16/20 162 lb (73.5 kg)    Immunization History  Administered Date(s) Administered   Influenza,inj,Quad PF,6+ Mos  01/14/2017, 01/06/2018, 10/05/2018, 10/04/2019    Diabetic Foot Exam - Simple   No data filed     Lab Results  Component Value Date   TSH 2.39 05/07/2016   Lab Results  Component Value Date   WBC 3.6 07/25/2019   HGB 13.2 03/16/2020   HCT 38.3 07/25/2019   MCV 96 07/25/2019   PLT 242 07/25/2019   Lab Results  Component Value Date   NA 138 07/25/2019   K 4.2 07/25/2019   CO2 21 07/25/2019   GLUCOSE 90 07/25/2019   BUN 9 07/25/2019  CREATININE 0.70 07/25/2019   BILITOT 0.3 07/25/2019   ALKPHOS 61 07/25/2019   AST 17 07/25/2019   ALT 13 07/25/2019   PROT 6.7 07/25/2019   ALBUMIN 4.1 07/25/2019   CALCIUM 9.1 07/25/2019   ANIONGAP 9 10/07/2017   No results found for: CHOL No results found for: HDL No results found for: LDLCALC No results found for: TRIG No results found for: CHOLHDL Lab Results  Component Value Date   HGBA1C 5.0 04/13/2019       Assessment & Plan:   Problem List Items Addressed This Visit   None Visit Diagnoses     Healthcare maintenance    -  Primary   Relevant Orders   HIV antibody (with reflex)   STD Panel   NuSwab BV and Candida, NAA   Pap IG and Chlamydia/Gonococcus, NAA ([facility]) Educated on STD screening and Pap screening   Other fatigue       Relevant Orders   Vitamin D, 25-hydroxy   Vitamin B12   CBC with Differential Informed to continue taking multi vitamin  Will review labs when resulted and proceed accordingly               Follow up 1 yr, physical exam, or sooner as needed    I have discontinued Don A. Jhaveri "Bri"'s acetaminophen, naproxen sodium, and naproxen. I have also changed her ibuprofen. Additionally, I am having her maintain her AMBULATORY NON FORMULARY MEDICATION and benzoyl peroxide-erythromycin.  Meds ordered this encounter  Medications   ibuprofen (ADVIL) 800 MG tablet    Sig: Take 1 tablet (800 mg total) by mouth 3 (three) times daily as needed.    Dispense:  30 tablet    Refill:  0       Kathrynn Speed, NP

## 2020-09-18 ENCOUNTER — Other Ambulatory Visit: Payer: Self-pay | Admitting: Nurse Practitioner

## 2020-09-18 MED ORDER — VITAMIN D (ERGOCALCIFEROL) 1.25 MG (50000 UNIT) PO CAPS: 50000.0000 [IU] | ORAL_CAPSULE | ORAL | 0 refills | Status: DC

## 2020-09-18 NOTE — Telephone Encounter (Signed)
Spoke with patient.

## 2020-09-19 LAB — CBC WITH DIFFERENTIAL/PLATELET
Basophils Absolute: 0 10*3/uL (ref 0.0–0.2)
Basos: 1 %
EOS (ABSOLUTE): 0 10*3/uL (ref 0.0–0.4)
Eos: 1 %
Hematocrit: 39.5 % (ref 34.0–46.6)
Hemoglobin: 13.5 g/dL (ref 11.1–15.9)
Immature Grans (Abs): 0 10*3/uL (ref 0.0–0.1)
Immature Granulocytes: 0 %
Lymphocytes Absolute: 1.3 10*3/uL (ref 0.7–3.1)
Lymphs: 35 %
MCH: 32.1 pg (ref 26.6–33.0)
MCHC: 34.2 g/dL (ref 31.5–35.7)
MCV: 94 fL (ref 79–97)
Monocytes Absolute: 0.4 10*3/uL (ref 0.1–0.9)
Monocytes: 11 %
Neutrophils Absolute: 2 10*3/uL (ref 1.4–7.0)
Neutrophils: 52 %
Platelets: 281 10*3/uL (ref 150–450)
RBC: 4.21 x10E6/uL (ref 3.77–5.28)
RDW: 11.9 % (ref 11.7–15.4)
WBC: 3.8 10*3/uL (ref 3.4–10.8)

## 2020-09-19 LAB — VITAMIN B12: Vitamin B-12: 348 pg/mL (ref 232–1245)

## 2020-09-19 LAB — RPR+HSVIGM+HBSAG+HSV2(IGG)+...
HIV Screen 4th Generation wRfx: NONREACTIVE
HSV 2 IgG, Type Spec: <0.91 index (ref 0.00–0.90)
HSVI/II Comb IgM: <0.91 Ratio (ref 0.00–0.90)
Hepatitis B Surface Ag: NEGATIVE
RPR Ser Ql: NONREACTIVE

## 2020-09-19 LAB — VITAMIN D 25 HYDROXY (VIT D DEFICIENCY, FRACTURES): Vit D, 25-Hydroxy: 14.6 ng/mL — ABNORMAL LOW (ref 30.0–100.0)

## 2020-09-20 LAB — NUSWAB BV AND CANDIDA, NAA
Candida albicans, NAA: NEGATIVE
Candida glabrata, NAA: NEGATIVE

## 2020-09-20 LAB — PAP IG AND CT-NG NAA
Chlamydia, Nuc. Acid Amp: NEGATIVE
Gonococcus by Nucleic Acid Amp: NEGATIVE

## 2020-10-03 DIAGNOSIS — Z20822 Contact with and (suspected) exposure to covid-19: Secondary | ICD-10-CM | POA: Diagnosis not present

## 2020-10-03 DIAGNOSIS — U071 COVID-19: Secondary | ICD-10-CM | POA: Diagnosis not present

## 2020-10-19 MED ORDER — IBUPROFEN 800 MG PO TABS: 800.0000 mg | ORAL_TABLET | Freq: Three times a day (TID) | ORAL | 1 refills | Status: DC | PRN

## 2020-11-02 ENCOUNTER — Other Ambulatory Visit: Payer: Self-pay

## 2020-11-02 ENCOUNTER — Ambulatory Visit (INDEPENDENT_AMBULATORY_CARE_PROVIDER_SITE_OTHER): Payer: Medicaid Other | Admitting: Nurse Practitioner

## 2020-11-02 ENCOUNTER — Encounter: Payer: Self-pay | Admitting: Nurse Practitioner

## 2020-11-02 VITALS — BP 128/67 | HR 74 | Temp 98.8°F | Ht 64.5 in | Wt 162.1 lb

## 2020-11-02 DIAGNOSIS — N907 Vulvar cyst: Secondary | ICD-10-CM

## 2020-11-02 DIAGNOSIS — L304 Erythema intertrigo: Secondary | ICD-10-CM

## 2020-11-02 DIAGNOSIS — N926 Irregular menstruation, unspecified: Secondary | ICD-10-CM

## 2020-11-02 NOTE — Progress Notes (Signed)
[facility] Patient [facility] [address] [place], [place]  [address] Phone:  [phone]   Fax:  [phone] Subjective:   Patient ID: Regina Warren, female    DOB: 19-Oct-1995, 25 y.o.   MRN: [id number]  Chief Complaint  Patient presents with   Follow-up    Bump left side labia, no other sx.    HPI Regina Warren 25 y.o. female with history of anxiety to the [facility] for vaginal cyst. Patient states that she noted mass on left labia one month ago, that resolved after her menstrual cycle. States that cyst resolved without intervention. Cyst returned 1 wk ago. Denies any pain or drainage.  Patient also concerned about feeling of "rawness" between bilateral thighs that occurs intermittently. Denies any pain or itching.  Patient also concerned about change in menstrual cycle after taking Plan B. States that her cycle began 1 wk early and was lighter than usual.   Denies any other complaints of concerns. Denies any fever. Denies any fatigue, chest pain, shortness of breath, HA or dizziness. Denies any blurred vision, numbness or tingling.    Past Medical History:  Diagnosis Date   Abdominal pain    Anxiety    Discomfort of back 03/2019   Vitamin D deficiency 06/2018    Past Surgical History:  Procedure Laterality Date   lung puncture      Family History  Problem Relation Age of Onset   Colon cancer Maternal Grandmother    Breast cancer Paternal Grandmother    Mental illness Neg Hx     Social History   Socioeconomic History   Marital status: Single    Spouse name: Not on file   Number of children: 1   Years of education: Not on file   Highest education level: Not on file  Occupational History   Occupation: case worker  Tobacco Use   Smoking status: Never   Smokeless tobacco: Never  Vaping Use   Vaping Use: Never used  Substance and Sexual Activity   Alcohol use: Not Currently    Comment: occasionally   Drug use: Never   Sexual  activity: Yes    Birth control/protection: None, Condom  Other Topics Concern   Not on file  Social History Narrative   ** Merged History Encounter **       Social Determinants of Health   Financial Resource Strain: Not on file  Food Insecurity: Not on file  Transportation Needs: Not on file  Physical Activity: Not on file  Stress: Not on file  Social Connections: Not on file  Intimate Partner Violence: Not on file    Outpatient Medications Prior to Visit  Medication Sig Dispense Refill   AMBULATORY NON FORMULARY MEDICATION Nitroglycerine ointment 0.125 %  Apply a pea sized amount internally three-four times daily. Dispense 30 GM 1 refill (Patient taking differently: Nitroglycerine ointment 0.125 %  Apply a pea sized amount internally three-four times daily as needed Dispense 30 GM 1 refill) 30 g 1   benzoyl peroxide-erythromycin (BENZAMYCIN) gel Apply 1 a small amount to affected area once a day     ibuprofen (ADVIL) 800 MG tablet Take 1 tablet (800 mg total) by mouth 3 (three) times daily as needed. 30 tablet 1   Vitamin D, Ergocalciferol, (DRISDOL) 1.25 MG (50000 UNIT) CAPS capsule Take 1 capsule (50,000 Units total) by mouth every 7 (seven) days for 8 doses. Take for 8 wks 8 capsule 0   No facility-administered medications prior to  visit.    Allergies  Allergen Reactions   Imitrex [Sumatriptan]     "made my brain feel like it was about to explode"    Review of Systems  Constitutional:  Negative for chills, fever and malaise/fatigue.  Respiratory:  Negative for cough and shortness of breath.   Cardiovascular:  Negative for chest pain, palpitations and leg swelling.  Gastrointestinal:  Negative for abdominal pain, blood in stool, constipation, diarrhea, nausea and vomiting.  Genitourinary:        See HPI  Musculoskeletal: Negative.   Skin:        See HPI  Neurological: Negative.   Psychiatric/Behavioral:  Negative for depression. The patient is not nervous/anxious.    All other systems reviewed and are negative.     Objective:    Physical Exam Vitals reviewed. Exam conducted with a chaperone present.  Constitutional:      General: She is not in acute distress.    Appearance: Normal appearance.  HENT:     Head: Normocephalic.  Cardiovascular:     Rate and Rhythm: Normal rate and regular rhythm.     Pulses: Normal pulses.     Heart sounds: Normal heart sounds.     Comments: No obvious peripheral edema Pulmonary:     Effort: Pulmonary effort is normal.     Breath sounds: Normal breath sounds.  Genitourinary:    Comments: 0.5 cm cyst noted to inner labia majora. No erythema and non tender to palpation. Non fluctuant, consistent with benign non infectious cyst Skin:    General: Skin is warm and dry.     Capillary Refill: Capillary refill takes less than 2 seconds.     Findings: No bruising, erythema, lesion or rash.     Comments: No abnormalities noted at location(s) of symptoms in inner thighs. BLE non tender to palpation with no discoloration  Neurological:     General: No focal deficit present.     Mental Status: She is alert and oriented to person, place, and time.  Psychiatric:        Mood and Affect: Mood normal.        Behavior: Behavior normal.        Thought Content: Thought content normal.        Judgment: Judgment normal.    BP 128/67 (BP Location: Right Arm, Patient Position: Sitting)   Pulse 74   Temp 98.8 F (37.1 C)   Ht 5' 4.5" (1.638 m)   Wt 162 lb 0.8 oz (73.5 kg)   LMP 10/11/2020   SpO2 100%   BMI 27.39 kg/m  Wt Readings from Last 3 Encounters:  11/02/20 162 lb 0.8 oz (73.5 kg)  09/17/20 162 lb 0.8 oz (73.5 kg)  04/27/20 162 lb (73.5 kg)    Immunization History  Administered Date(s) Administered   Influenza,inj,Quad PF,6+ Mos 01/14/2017, 01/06/2018, 10/05/2018, 10/04/2019    Diabetic Foot Exam - Simple   No data filed     Lab Results  Component Value Date   TSH 2.39 05/07/2016   Lab Results   Component Value Date   WBC 3.8 09/17/2020   HGB 13.5 09/17/2020   HCT 39.5 09/17/2020   MCV 94 09/17/2020   PLT 281 09/17/2020   Lab Results  Component Value Date   NA 138 07/25/2019   K 4.2 07/25/2019   CO2 21 07/25/2019   GLUCOSE 90 07/25/2019   BUN 9 07/25/2019   CREATININE 0.70 07/25/2019   BILITOT 0.3 07/25/2019   ALKPHOS 61  07/25/2019   AST 17 07/25/2019   ALT 13 07/25/2019   PROT 6.7 07/25/2019   ALBUMIN 4.1 07/25/2019   CALCIUM 9.1 07/25/2019   ANIONGAP 9 10/07/2017   No results found for: CHOL No results found for: HDL No results found for: LDLCALC No results found for: TRIG No results found for: CHOLHDL Lab Results  Component Value Date   HGBA1C 5.0 04/13/2019       Assessment & Plan:   Problem List Items Addressed This Visit   None Visit Diagnoses     Chafing    -  Primary Patient verbalized symptoms of "rawness" between thighs likely relates to chafing, discussed OTC medications that may assist in managing discomfort and for overall treatment    Labial cyst     Appears non infectious, low suspicion for Bartholin's cyst. Patient verbalizes waxing area frequently, concerned that this may be contributing to formation of cyst, strongly discouraged waxing area in the future.  Discussed red flag symptoms and possible s/s that patient should return to the clinic or ED for further evaluation. Discussed non pharmacological methods for management of cyst   Abnormal menstrual cycle     Informed that change in cycle likely related to usage of Plan B and possible recent increase in stressors  Discussed red flag s/s that may require further evaluation in the clinic and/ ED   Follow up in 1-2 wks as needed if symptoms worsen or do not improve, otherwise maintain previously scheduled follow up.    I am having Jerusalem A. Benscoter "Bri" maintain her AMBULATORY NON FORMULARY MEDICATION, benzoyl peroxide-erythromycin, Vitamin D (Ergocalciferol), and ibuprofen.  No  orders of the defined types were placed in this encounter.    Kathrynn Speed, NP

## 2020-11-02 NOTE — Patient Instructions (Signed)
You were seen today in the [facility] for vaginal cyst.  Please follow up in  1-2 wks as needed

## 2020-11-10 ENCOUNTER — Other Ambulatory Visit: Payer: Self-pay | Admitting: Nurse Practitioner

## 2020-11-16 ENCOUNTER — Ambulatory Visit: Payer: Medicaid Other | Admitting: Nurse Practitioner

## 2020-12-03 DIAGNOSIS — R300 Dysuria: Secondary | ICD-10-CM | POA: Diagnosis not present

## 2020-12-03 DIAGNOSIS — N390 Urinary tract infection, site not specified: Secondary | ICD-10-CM | POA: Diagnosis not present

## 2020-12-03 DIAGNOSIS — R350 Frequency of micturition: Secondary | ICD-10-CM | POA: Diagnosis not present

## 2020-12-07 ENCOUNTER — Other Ambulatory Visit: Payer: Self-pay

## 2020-12-07 ENCOUNTER — Other Ambulatory Visit: Payer: Medicaid Other

## 2020-12-07 ENCOUNTER — Other Ambulatory Visit: Payer: Self-pay | Admitting: Nurse Practitioner

## 2020-12-07 DIAGNOSIS — E559 Vitamin D deficiency, unspecified: Secondary | ICD-10-CM

## 2020-12-08 LAB — VITAMIN D 25 HYDROXY (VIT D DEFICIENCY, FRACTURES): Vit D, 25-Hydroxy: 24.7 ng/mL — ABNORMAL LOW (ref 30.0–100.0)

## 2020-12-11 ENCOUNTER — Other Ambulatory Visit: Payer: Self-pay | Admitting: Nurse Practitioner

## 2020-12-11 DIAGNOSIS — E559 Vitamin D deficiency, unspecified: Secondary | ICD-10-CM

## 2020-12-11 MED ORDER — VITAMIN D (ERGOCALCIFEROL) 1.25 MG (50000 UNIT) PO CAPS: ORAL_CAPSULE | ORAL | 0 refills | Status: DC

## 2021-02-05 ENCOUNTER — Other Ambulatory Visit: Payer: Self-pay | Admitting: Nurse Practitioner

## 2021-02-05 DIAGNOSIS — R030 Elevated blood-pressure reading, without diagnosis of hypertension: Secondary | ICD-10-CM | POA: Diagnosis not present

## 2021-02-05 DIAGNOSIS — Z114 Encounter for screening for human immunodeficiency virus [HIV]: Secondary | ICD-10-CM | POA: Diagnosis not present

## 2021-02-05 DIAGNOSIS — Z113 Encounter for screening for infections with a predominantly sexual mode of transmission: Secondary | ICD-10-CM | POA: Diagnosis not present

## 2021-02-05 DIAGNOSIS — E559 Vitamin D deficiency, unspecified: Secondary | ICD-10-CM

## 2021-02-08 ENCOUNTER — Ambulatory Visit: Payer: Medicaid Other | Admitting: Nurse Practitioner

## 2021-02-11 ENCOUNTER — Other Ambulatory Visit: Payer: Medicaid Other

## 2021-02-15 DIAGNOSIS — F439 Reaction to severe stress, unspecified: Secondary | ICD-10-CM | POA: Diagnosis not present

## 2021-02-15 DIAGNOSIS — R000 Tachycardia, unspecified: Secondary | ICD-10-CM | POA: Diagnosis not present

## 2021-02-15 DIAGNOSIS — G4489 Other headache syndrome: Secondary | ICD-10-CM | POA: Diagnosis not present

## 2021-02-15 DIAGNOSIS — I10 Essential (primary) hypertension: Secondary | ICD-10-CM | POA: Diagnosis not present

## 2021-02-15 DIAGNOSIS — R202 Paresthesia of skin: Secondary | ICD-10-CM | POA: Diagnosis not present

## 2021-02-15 DIAGNOSIS — I159 Secondary hypertension, unspecified: Secondary | ICD-10-CM | POA: Diagnosis not present

## 2021-02-18 ENCOUNTER — Ambulatory Visit (INDEPENDENT_AMBULATORY_CARE_PROVIDER_SITE_OTHER): Payer: Medicaid Other | Admitting: Nurse Practitioner

## 2021-02-18 ENCOUNTER — Other Ambulatory Visit: Payer: Self-pay

## 2021-02-18 VITALS — BP 134/96 | HR 96 | Temp 98.8°F | Resp 18

## 2021-02-18 DIAGNOSIS — G4489 Other headache syndrome: Secondary | ICD-10-CM

## 2021-02-18 DIAGNOSIS — R030 Elevated blood-pressure reading, without diagnosis of hypertension: Secondary | ICD-10-CM | POA: Diagnosis not present

## 2021-02-18 NOTE — Progress Notes (Signed)
[name], [place]  [address] Phone:  [phone]   Fax:  [phone] Subjective:   Patient ID: Regina Warren, female    DOB: 1995-02-15, 26 y.o.   MRN: [id number]  No chief complaint on file.  HPI Regina Warren 26 y.o. female  has a past medical history of Abdominal pain, Anxiety, Discomfort of back (03/2019), and Vitamin D deficiency (06/2018). To the [facility] for hospital follow up.  States that she woke up with a headache on 1/20, checked B/P and it was 150/101. Contacted EMS and was taken to the ED. Since being discharged from the hospital has had one headache. Was diagnosed in ED with stress reaction. Patient endorses recent increases in stressors. Denies any elevated B/P since being discharged from the hospital. Denies any other complaints today.  Denies any fatigue, chest pain, shortness of breath, HA or dizziness. Denies any blurred vision, numbness or tingling.  Past Medical History:  Diagnosis Date   Abdominal pain    Anxiety    Discomfort of back 03/2019   Vitamin D deficiency 06/2018    Past Surgical History:  Procedure Laterality Date   lung puncture      Family History  Problem Relation Age of Onset   Colon cancer Maternal Grandmother    Breast cancer Paternal Grandmother    Mental illness Neg Hx     Social History   Socioeconomic History   Marital status: Single    Spouse name: Not on file   Number of children: 1   Years of education: Not on file   Highest education level: Not on file  Occupational History   Occupation: case worker  Tobacco Use   Smoking status: Never   Smokeless tobacco: Never  Vaping Use   Vaping Use: Never used  Substance and Sexual Activity   Alcohol use: Not Currently    Comment: occasionally   Drug use: Never   Sexual activity: Yes    Birth control/protection: None, Condom  Other Topics Concern   Not on file  Social History Narrative   ** Merged History  Encounter **       Social Determinants of Health   Financial Resource Strain: Not on file  Food Insecurity: Not on file  Transportation Needs: Not on file  Physical Activity: Not on file  Stress: Not on file  Social Connections: Not on file  Intimate Partner Violence: Not on file    Outpatient Medications Prior to Visit  Medication Sig Dispense Refill   AMBULATORY NON FORMULARY MEDICATION Nitroglycerine ointment 0.125 %  Apply a pea sized amount internally three-four times daily. Dispense 30 GM 1 refill (Patient taking differently: Nitroglycerine ointment 0.125 %  Apply a pea sized amount internally three-four times daily as needed Dispense 30 GM 1 refill) 30 g 1   benzoyl peroxide-erythromycin (BENZAMYCIN) gel Apply 1 a small amount to affected area once a day     ibuprofen (ADVIL) 800 MG tablet Take 1 tablet (800 mg total) by mouth 3 (three) times daily as needed. 30 tablet 1   Vitamin D, Ergocalciferol, (DRISDOL) 1.25 MG (50000 UNIT) CAPS capsule TAKE 1 CAPSULE BY MOUTH EVERY 7 DAYS FOR 8 DOSES 8 capsule 0   No facility-administered medications prior to visit.    Allergies  Allergen Reactions   Imitrex [Sumatriptan]     "made my brain feel like it was about to explode"    Review of Systems  Constitutional:  Negative  for chills, fever and malaise/fatigue.  Respiratory:  Negative for cough and shortness of breath.   Cardiovascular:  Negative for chest pain, palpitations and leg swelling.  Gastrointestinal:  Negative for abdominal pain, blood in stool, constipation, diarrhea, nausea and vomiting.  Skin: Negative.   Neurological:  Positive for headaches.  Psychiatric/Behavioral:  Negative for depression. The patient is not nervous/anxious.   All other systems reviewed and are negative.     Objective:    Physical Exam Vitals reviewed.  Constitutional:      General: She is not in acute distress.    Appearance: Normal appearance.  HENT:     Head: Normocephalic.   Cardiovascular:     Rate and Rhythm: Normal rate and regular rhythm.     Pulses: Normal pulses.     Heart sounds: Normal heart sounds.     Comments: No obvious peripheral edema Pulmonary:     Effort: Pulmonary effort is normal.     Breath sounds: Normal breath sounds.  Musculoskeletal:        General: No swelling, tenderness, deformity or signs of injury. Normal range of motion.     Right lower leg: No edema.     Left lower leg: No edema.  Skin:    General: Skin is warm and dry.     Capillary Refill: Capillary refill takes less than 2 seconds.  Neurological:     General: No focal deficit present.     Mental Status: She is alert and oriented to person, place, and time.  Psychiatric:        Mood and Affect: Mood normal.        Behavior: Behavior normal.        Thought Content: Thought content normal.        Judgment: Judgment normal.    BP (!) 134/96    Pulse 96    Temp 98.8 F (37.1 C)    Resp 18    SpO2 100%  Wt Readings from Last 3 Encounters:  11/02/20 162 lb 0.8 oz (73.5 kg)  09/17/20 162 lb 0.8 oz (73.5 kg)  04/27/20 162 lb (73.5 kg)    Immunization History  Administered Date(s) Administered   Influenza,inj,Quad PF,6+ Mos 01/14/2017, 01/06/2018, 10/05/2018, 10/04/2019    Diabetic Foot Exam - Simple   No data filed     Lab Results  Component Value Date   TSH 2.39 05/07/2016   Lab Results  Component Value Date   WBC 3.8 09/17/2020   HGB 13.5 09/17/2020   HCT 39.5 09/17/2020   MCV 94 09/17/2020   PLT 281 09/17/2020   Lab Results  Component Value Date   NA 138 07/25/2019   K 4.2 07/25/2019   CO2 21 07/25/2019   GLUCOSE 90 07/25/2019   BUN 9 07/25/2019   CREATININE 0.70 07/25/2019   BILITOT 0.3 07/25/2019   ALKPHOS 61 07/25/2019   AST 17 07/25/2019   ALT 13 07/25/2019   PROT 6.7 07/25/2019   ALBUMIN 4.1 07/25/2019   CALCIUM 9.1 07/25/2019   ANIONGAP 9 10/07/2017   No results found for: CHOL No results found for: HDL No results found for:  LDLCALC No results found for: TRIG No results found for: CHOLHDL Lab Results  Component Value Date   HGBA1C 5.0 04/13/2019       Assessment & Plan:   Problem List Items Addressed This Visit       Other   Elevated BP without diagnosis of hypertension - Primary Discussed methods for management  of stressors  Discussed potential causes of elevated B/P   Other Visit Diagnoses     Other headache syndrome     Discussed non pharmacological methods for management of symptoms  Informed to take OTC medications as needed for symptoms   Maintain upcoming follow up with PCP, sooner as needed    I am having Regina Warren "Bri" maintain her AMBULATORY NON FORMULARY MEDICATION, benzoyl peroxide-erythromycin, ibuprofen, and Vitamin D (Ergocalciferol).  No orders of the defined types were placed in this encounter.    Teena Dunk, NP

## 2021-02-18 NOTE — Patient Instructions (Signed)
You were seen today in the [facility] for hospital follow up. Please follow up in 6 mths for reevaluation headache and anxiety.

## 2021-02-19 ENCOUNTER — Encounter: Payer: Self-pay | Admitting: Nurse Practitioner

## 2021-03-04 ENCOUNTER — Other Ambulatory Visit: Payer: Self-pay | Admitting: Nurse Practitioner

## 2021-03-04 DIAGNOSIS — G4489 Other headache syndrome: Secondary | ICD-10-CM

## 2021-03-04 MED ORDER — IBUPROFEN 800 MG PO TABS: 800.0000 mg | ORAL_TABLET | Freq: Three times a day (TID) | ORAL | 1 refills | Status: DC | PRN

## 2021-04-08 DIAGNOSIS — N3090 Cystitis, unspecified without hematuria: Secondary | ICD-10-CM | POA: Diagnosis not present

## 2021-04-08 DIAGNOSIS — R102 Pelvic and perineal pain: Secondary | ICD-10-CM | POA: Diagnosis not present

## 2021-05-03 DIAGNOSIS — R399 Unspecified symptoms and signs involving the genitourinary system: Secondary | ICD-10-CM | POA: Diagnosis not present

## 2021-05-03 DIAGNOSIS — R350 Frequency of micturition: Secondary | ICD-10-CM | POA: Diagnosis not present

## 2021-05-03 DIAGNOSIS — Z113 Encounter for screening for infections with a predominantly sexual mode of transmission: Secondary | ICD-10-CM | POA: Diagnosis not present

## 2021-05-07 DIAGNOSIS — R102 Pelvic and perineal pain: Secondary | ICD-10-CM | POA: Diagnosis not present

## 2021-05-08 ENCOUNTER — Other Ambulatory Visit: Payer: Self-pay

## 2021-05-08 ENCOUNTER — Other Ambulatory Visit: Payer: Self-pay | Admitting: Nurse Practitioner

## 2021-05-08 DIAGNOSIS — G4489 Other headache syndrome: Secondary | ICD-10-CM

## 2021-05-08 MED ORDER — IBUPROFEN 800 MG PO TABS: 800.0000 mg | ORAL_TABLET | Freq: Three times a day (TID) | ORAL | 1 refills | Status: DC | PRN

## 2021-05-10 DIAGNOSIS — N939 Abnormal uterine and vaginal bleeding, unspecified: Secondary | ICD-10-CM | POA: Diagnosis not present

## 2021-05-15 ENCOUNTER — Telehealth: Payer: Self-pay

## 2021-05-15 ENCOUNTER — Telehealth: Payer: Self-pay | Admitting: Nurse Practitioner

## 2021-05-15 DIAGNOSIS — R1012 Left upper quadrant pain: Secondary | ICD-10-CM | POA: Diagnosis not present

## 2021-05-15 NOTE — Telephone Encounter (Signed)
Pt just called and wanted you to call her ...wouldn't tell me nothing ?

## 2021-05-28 NOTE — Telephone Encounter (Signed)
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  ?

## 2021-06-13 ENCOUNTER — Other Ambulatory Visit: Payer: Self-pay

## 2021-06-13 DIAGNOSIS — E559 Vitamin D deficiency, unspecified: Secondary | ICD-10-CM

## 2021-06-13 MED ORDER — VITAMIN D (ERGOCALCIFEROL) 1.25 MG (50000 UNIT) PO CAPS: ORAL_CAPSULE | ORAL | 0 refills | Status: DC

## 2021-07-26 ENCOUNTER — Ambulatory Visit: Payer: Medicaid Other | Admitting: Nurse Practitioner

## 2021-08-15 ENCOUNTER — Encounter: Payer: Self-pay | Admitting: Nurse Practitioner

## 2021-08-15 ENCOUNTER — Ambulatory Visit (INDEPENDENT_AMBULATORY_CARE_PROVIDER_SITE_OTHER): Payer: Medicaid Other | Admitting: Nurse Practitioner

## 2021-08-15 VITALS — BP 135/91 | HR 88 | Temp 98.0°F | Ht 65.0 in | Wt 172.4 lb

## 2021-08-15 DIAGNOSIS — E559 Vitamin D deficiency, unspecified: Secondary | ICD-10-CM | POA: Diagnosis not present

## 2021-08-15 DIAGNOSIS — Z0000 Encounter for general adult medical examination without abnormal findings: Secondary | ICD-10-CM | POA: Diagnosis not present

## 2021-08-15 DIAGNOSIS — K5900 Constipation, unspecified: Secondary | ICD-10-CM

## 2021-08-15 DIAGNOSIS — G4489 Other headache syndrome: Secondary | ICD-10-CM

## 2021-08-15 DIAGNOSIS — Z113 Encounter for screening for infections with a predominantly sexual mode of transmission: Secondary | ICD-10-CM | POA: Diagnosis not present

## 2021-08-15 LAB — POCT URINALYSIS DIP (CLINITEK)
Bilirubin, UA: NEGATIVE
Blood, UA: NEGATIVE
Glucose, UA: NEGATIVE mg/dL
Ketones, POC UA: NEGATIVE mg/dL
Nitrite, UA: NEGATIVE
POC PROTEIN,UA: NEGATIVE
Spec Grav, UA: 1.02 (ref 1.010–1.025)
Urobilinogen, UA: 0.2 E.U./dL
pH, UA: 7 (ref 5.0–8.0)

## 2021-08-15 MED ORDER — VITAMIN D (ERGOCALCIFEROL) 1.25 MG (50000 UNIT) PO CAPS: ORAL_CAPSULE | ORAL | 0 refills | Status: DC

## 2021-08-15 MED ORDER — IBUPROFEN 800 MG PO TABS: 800.0000 mg | ORAL_TABLET | Freq: Three times a day (TID) | ORAL | 1 refills | Status: DC | PRN

## 2021-08-15 MED ORDER — DOCUSATE SODIUM 100 MG PO CAPS: 100.0000 mg | ORAL_CAPSULE | Freq: Two times a day (BID) | ORAL | 0 refills | Status: DC

## 2021-08-15 NOTE — Progress Notes (Signed)
@Patient  ID: , female    DOB: [birth year], 26 y.o.   MRN: [id number]  Chief Complaint  Patient presents with   Follow-up    Pt is here for physical pt is requesting a refill vitamin D and ibuprofen     Referring provider: [id number], NP   HPI  Patient presents today for a physical.  She does need refills on her medications.  She states that overall things are going well.  She is requesting screening for STD.  States that her last menstrual cycle was June 21. Denies f/c/s, n/v/d, hemoptysis, PND, leg swelling Denies chest pain or edema     Allergies  Allergen Reactions   Imitrex [Sumatriptan]     "made my brain feel like it was about to explode"    Immunization History  Administered Date(s) Administered   Influenza,inj,Quad PF,6+ Mos 01/14/2017, 01/06/2018, 10/05/2018, 10/04/2019    Past Medical History:  Diagnosis Date   Abdominal pain    Anxiety    Discomfort of back 03/2019   Vitamin D deficiency 06/2018    Tobacco History: Social History   Tobacco Use  Smoking Status Never  Smokeless Tobacco Never   Counseling given: Not Answered   Outpatient Encounter Medications as of 08/15/2021  Medication Sig   benzoyl peroxide-erythromycin (BENZAMYCIN) gel Apply 1 a small amount to affected area once a day   docusate sodium (COLACE) 100 MG capsule Take 1 capsule (100 mg total) by mouth 2 (two) times daily.   [DISCONTINUED] ibuprofen (ADVIL) 800 MG tablet Take 1 tablet (800 mg total) by mouth 3 (three) times daily as needed.   [DISCONTINUED] Vitamin D, Ergocalciferol, (DRISDOL) 1.25 MG (50000 UNIT) CAPS capsule TAKE 1 CAPSULE BY MOUTH EVERY 7 DAYS FOR 8 DOSES   AMBULATORY NON FORMULARY MEDICATION Nitroglycerine ointment 0.125 %  Apply a pea sized amount internally three-four times daily. Dispense 30 GM 1 refill (Patient taking differently: Nitroglycerine ointment 0.125 %  Apply a pea sized amount internally three-four times daily as  needed Dispense 30 GM 1 refill)   ibuprofen (ADVIL) 800 MG tablet Take 1 tablet (800 mg total) by mouth 3 (three) times daily as needed.   [DISCONTINUED] Vitamin D, Ergocalciferol, (DRISDOL) 1.25 MG (50000 UNIT) CAPS capsule TAKE 1 CAPSULE BY MOUTH EVERY 7 DAYS FOR 8 DOSES   No facility-administered encounter medications on file as of 08/15/2021.     Review of Systems  Review of Systems  Constitutional: Negative.   HENT: Negative.    Cardiovascular: Negative.   Gastrointestinal: Negative.   Allergic/Immunologic: Negative.   Neurological: Negative.   Psychiatric/Behavioral: Negative.         Physical Exam  BP (!) 135/91   Pulse 88   Temp 98 F (36.7 C)   Ht 5\' 5"  (1.651 m)   Wt 172 lb 6 oz (78.2 kg)   BMI 28.68 kg/m   Wt Readings from Last 5 Encounters:  08/15/21 172 lb 6 oz (78.2 kg)  11/02/20 162 lb 0.8 oz (73.5 kg)  09/17/20 162 lb 0.8 oz (73.5 kg)  04/27/20 162 lb (73.5 kg)  03/16/20 162 lb (73.5 kg)     Physical Exam Vitals and nursing note reviewed.  Constitutional:      General: She is not in acute distress.    Appearance: She is well-developed.  Cardiovascular:     Rate and Rhythm: Normal rate and regular rhythm.  Pulmonary:     Effort: Pulmonary effort is normal.     Breath  sounds: Normal breath sounds.  Neurological:     Mental Status: She is alert and oriented to person, place, and time.      Lab Results:  CBC    Component Value Date/Time   WBC 4.7 08/15/2021 1630   WBC 6.2 10/07/2017 0107   RBC 3.96 08/15/2021 1630   RBC 3.96 10/07/2017 0107   HGB 12.8 08/15/2021 1630   HCT 37.5 08/15/2021 1630   PLT 276 08/15/2021 1630   MCV 95 08/15/2021 1630   MCH 32.3 08/15/2021 1630   MCH 32.1 10/07/2017 0107   MCHC 34.1 08/15/2021 1630   MCHC 33.3 10/07/2017 0107   RDW 11.8 08/15/2021 1630   LYMPHSABS 1.3 09/17/2020 1102   MONOABS 232 05/07/2016 1110   EOSABS 0.0 09/17/2020 1102   BASOSABS 0.0 09/17/2020 1102    BMET    Component  Value Date/Time   NA 137 08/15/2021 1630   K 4.1 08/15/2021 1630   CL 104 08/15/2021 1630   CO2 21 08/15/2021 1630   GLUCOSE 85 08/15/2021 1630   GLUCOSE 89 10/07/2017 0107   BUN 9 08/15/2021 1630   CREATININE 0.92 08/15/2021 1630   CREATININE 0.75 05/07/2016 1110   CALCIUM 9.2 08/15/2021 1630   GFRNONAA 121 07/25/2019 0951   GFRNONAA >89 05/07/2016 1110   GFRAA 139 07/25/2019 0951   GFRAA >89 05/07/2016 1110      Assessment & Plan:   Screening for STD (sexually transmitted disease) - POCT URINALYSIS DIP (CLINITEK) - NuSwab Vaginitis Plus (VG+) - RPR+HIV+GC+CT Panel  2. Routine adult health maintenance  - TSH - CBC - Comprehensive metabolic panel - Vitamin D, 25-hydroxy - Lipid Panel  3. Vitamin D deficiency  - Vitamin D, 25-hydroxy - Vitamin D, Ergocalciferol, (DRISDOL) 1.25 MG (50000 UNIT) CAPS capsule; TAKE 1 CAPSULE BY MOUTH EVERY 7 DAYS FOR 8 DOSES  Dispense: 8 capsule; Refill: 0  4. Other headache syndrome  - ibuprofen (ADVIL) 800 MG tablet; Take 1 tablet (800 mg total) by mouth 3 (three) times daily as needed.  Dispense: 30 tablet; Refill: 1  5. Constipation, unspecified constipation type  - docusate sodium (COLACE) 100 MG capsule; Take 1 capsule (100 mg total) by mouth 2 (two) times daily.  Dispense: 10 capsule; Refill: 0   Follow up:   Follow up in 3 months follow up     Ivonne Andrew, NP 10/23/2021

## 2021-08-15 NOTE — Patient Instructions (Signed)
1. Screening for STD (sexually transmitted disease)  - POCT URINALYSIS DIP (CLINITEK) - NuSwab Vaginitis Plus (VG+) - RPR+HIV+GC+CT Panel  2. Routine adult health maintenance  - TSH - CBC - Comprehensive metabolic panel - Vitamin D, 25-hydroxy - Lipid Panel  3. Vitamin D deficiency  - Vitamin D, 25-hydroxy - Vitamin D, Ergocalciferol, (DRISDOL) 1.25 MG (50000 UNIT) CAPS capsule; TAKE 1 CAPSULE BY MOUTH EVERY 7 DAYS FOR 8 DOSES  Dispense: 8 capsule; Refill: 0  4. Other headache syndrome  - ibuprofen (ADVIL) 800 MG tablet; Take 1 tablet (800 mg total) by mouth 3 (three) times daily as needed.  Dispense: 30 tablet; Refill: 1  5. Constipation, unspecified constipation type  - docusate sodium (COLACE) 100 MG capsule; Take 1 capsule (100 mg total) by mouth 2 (two) times daily.  Dispense: 10 capsule; Refill: 0   Follow up:   Follow up in 3 months follow up

## 2021-08-16 LAB — COMPREHENSIVE METABOLIC PANEL
ALT: 15 IU/L (ref 0–32)
AST: 15 IU/L (ref 0–40)
Albumin/Globulin Ratio: 1.7 (ref 1.2–2.2)
Albumin: 4.3 g/dL (ref 4.0–5.0)
Alkaline Phosphatase: 60 IU/L (ref 44–121)
BUN/Creatinine Ratio: 10 (ref 9–23)
BUN: 9 mg/dL (ref 6–20)
Bilirubin Total: 0.4 mg/dL (ref 0.0–1.2)
CO2: 21 mmol/L (ref 20–29)
Calcium: 9.2 mg/dL (ref 8.7–10.2)
Chloride: 104 mmol/L (ref 96–106)
Creatinine, Ser: 0.92 mg/dL (ref 0.57–1.00)
Globulin, Total: 2.6 g/dL (ref 1.5–4.5)
Glucose: 85 mg/dL (ref 70–99)
Potassium: 4.1 mmol/L (ref 3.5–5.2)
Sodium: 137 mmol/L (ref 134–144)
Total Protein: 6.9 g/dL (ref 6.0–8.5)
eGFR: 88 mL/min/{1.73_m2} (ref >59)

## 2021-08-16 LAB — LIPID PANEL
Chol/HDL Ratio: 2.7 ratio (ref 0.0–4.4)
Cholesterol, Total: 151 mg/dL (ref 100–199)
HDL: 56 mg/dL (ref >39)
LDL Chol Calc (NIH): 83 mg/dL (ref 0–99)
Triglycerides: 55 mg/dL (ref 0–149)
VLDL Cholesterol Cal: 12 mg/dL (ref 5–40)

## 2021-08-16 LAB — CBC
Hematocrit: 37.5 % (ref 34.0–46.6)
Hemoglobin: 12.8 g/dL (ref 11.1–15.9)
MCH: 32.3 pg (ref 26.6–33.0)
MCHC: 34.1 g/dL (ref 31.5–35.7)
MCV: 95 fL (ref 79–97)
Platelets: 276 10*3/uL (ref 150–450)
RBC: 3.96 x10E6/uL (ref 3.77–5.28)
RDW: 11.8 % (ref 11.7–15.4)
WBC: 4.7 10*3/uL (ref 3.4–10.8)

## 2021-08-16 LAB — VITAMIN D 25 HYDROXY (VIT D DEFICIENCY, FRACTURES): Vit D, 25-Hydroxy: 23.6 ng/mL — ABNORMAL LOW (ref 30.0–100.0)

## 2021-08-16 LAB — TSH: TSH: 1.37 u[IU]/mL (ref 0.450–4.500)

## 2021-08-17 LAB — URINE CULTURE

## 2021-08-19 ENCOUNTER — Ambulatory Visit: Payer: Medicaid Other | Admitting: Nurse Practitioner

## 2021-08-19 LAB — NUSWAB VAGINITIS PLUS (VG+)
Candida albicans, NAA: NEGATIVE
Candida glabrata, NAA: NEGATIVE
Chlamydia trachomatis, NAA: NEGATIVE
Neisseria gonorrhoeae, NAA: NEGATIVE
Trich vag by NAA: NEGATIVE

## 2021-08-19 LAB — RPR+HIV+GC+CT PANEL
Chlamydia trachomatis, NAA: NEGATIVE
HIV Screen 4th Generation wRfx: NONREACTIVE
Neisseria Gonorrhoeae by PCR: NEGATIVE
RPR Ser Ql: NONREACTIVE

## 2021-09-03 DIAGNOSIS — Z111 Encounter for screening for respiratory tuberculosis: Secondary | ICD-10-CM | POA: Diagnosis not present

## 2021-09-05 DIAGNOSIS — R3915 Urgency of urination: Secondary | ICD-10-CM | POA: Diagnosis not present

## 2021-09-05 DIAGNOSIS — N898 Other specified noninflammatory disorders of vagina: Secondary | ICD-10-CM | POA: Diagnosis not present

## 2021-09-05 DIAGNOSIS — R350 Frequency of micturition: Secondary | ICD-10-CM | POA: Diagnosis not present

## 2021-09-06 DIAGNOSIS — Z681 Body mass index (BMI) 19 or less, adult: Secondary | ICD-10-CM | POA: Diagnosis not present

## 2021-09-06 DIAGNOSIS — Z111 Encounter for screening for respiratory tuberculosis: Secondary | ICD-10-CM | POA: Diagnosis not present

## 2021-10-11 ENCOUNTER — Other Ambulatory Visit: Payer: Self-pay | Admitting: Nurse Practitioner

## 2021-10-11 DIAGNOSIS — E559 Vitamin D deficiency, unspecified: Secondary | ICD-10-CM

## 2021-10-14 IMAGING — CT CT ABD-PELV W/O CM
1 of 2 series · 15 of 32 positions shown, 19 images · non-contrast
Comparison: 05/28/2018 abdominal ultrasound.

CLINICAL DATA: Umbilical hernia causing intermittent pain. Blood in
stool since last week.

EXAM:
CT ABDOMEN AND PELVIS WITHOUT CONTRAST
TECHNIQUE: Multidetector CT imaging of the abdomen and pelvis was performed
following the standard protocol without IV contrast.

[Series 2: abd pel wo · axial · 0.69mm/px · z∈[+397,+787]mm · 15 of 86 slices shown, 19 images]
[im 4/86  soft-tissue]
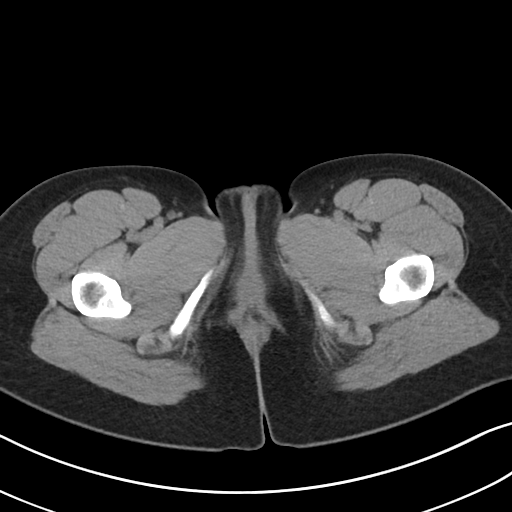
[im 4/86  bone]
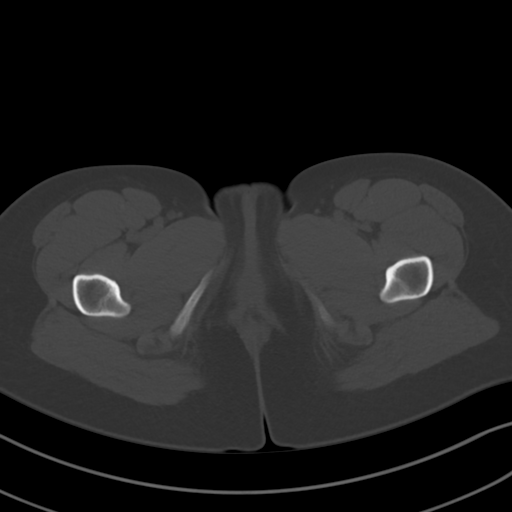
[im 11/86  soft-tissue]
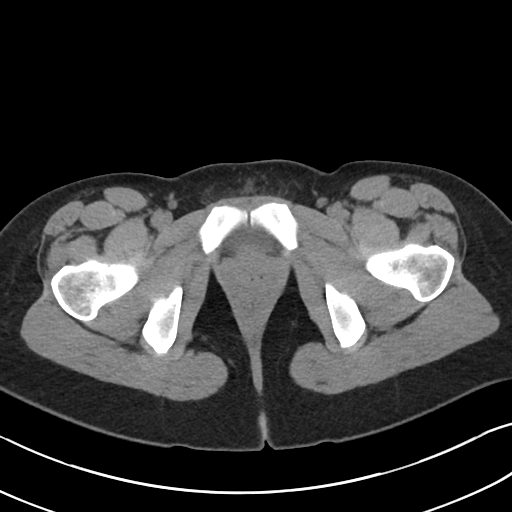
[im 18/86  soft-tissue]
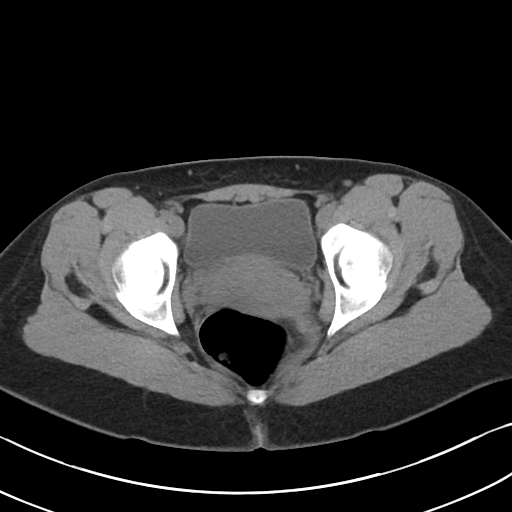
[im 24/86  soft-tissue]
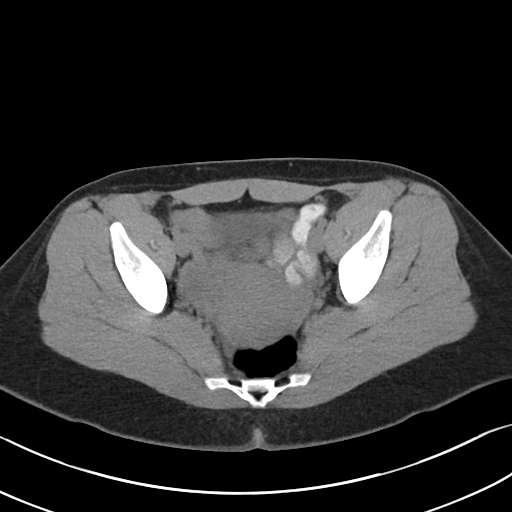
[im 31/86  soft-tissue]
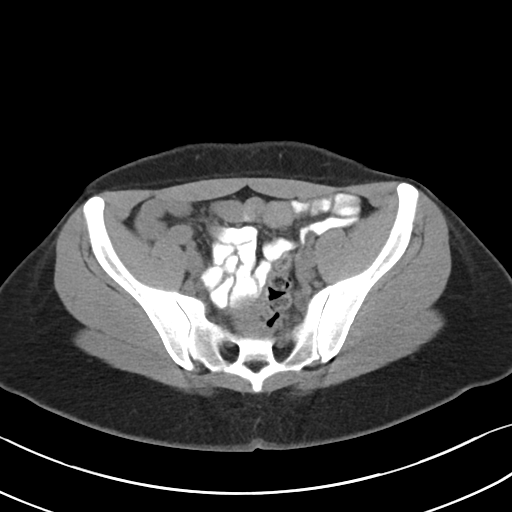
[im 38/86  soft-tissue]
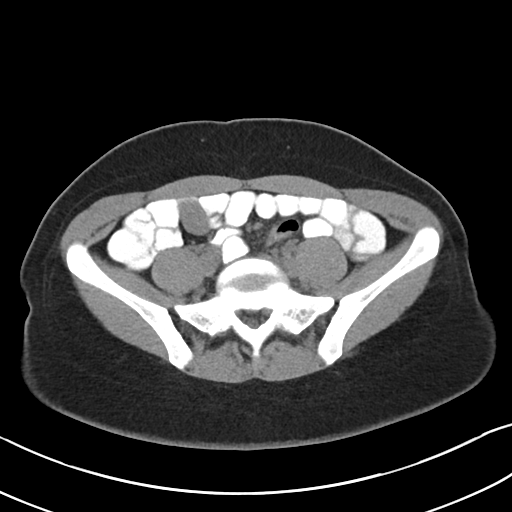
[im 45/86  soft-tissue]
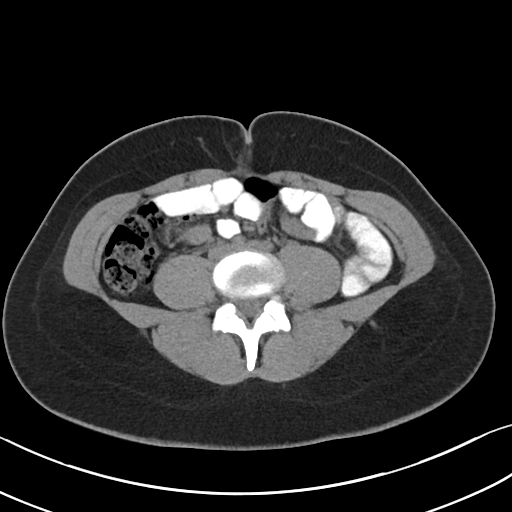
[im 48/86  soft-tissue]
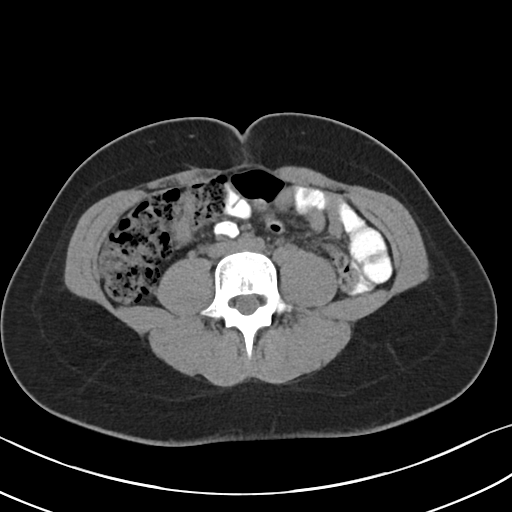
[im 55/86  soft-tissue]
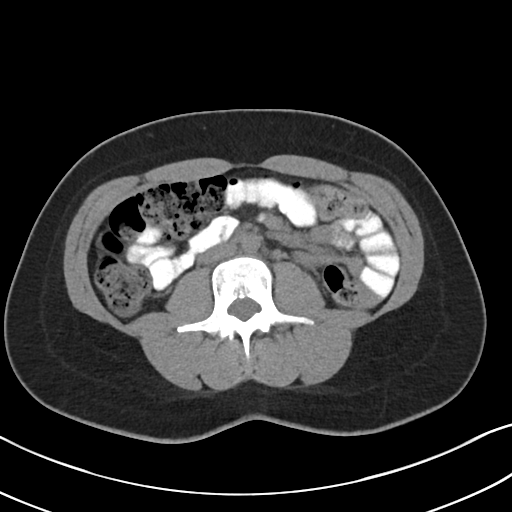
[im 55/86  bone]
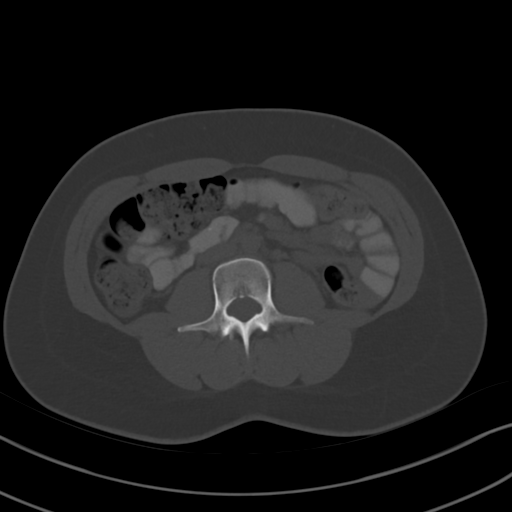
[im 62/86  soft-tissue]
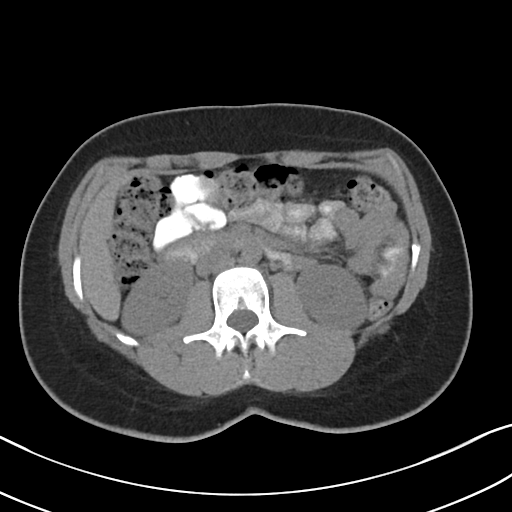
[im 69/86  soft-tissue]
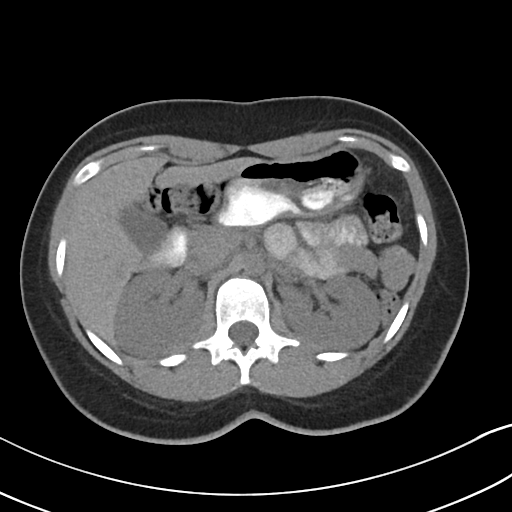
[im 72/86  lung]
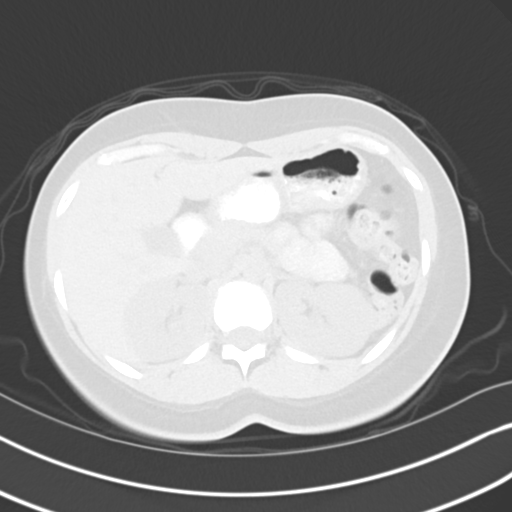
[im 75/86  soft-tissue]
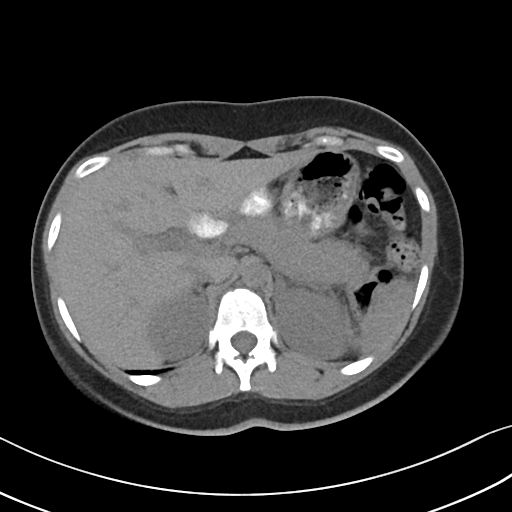
[im 75/86  lung]
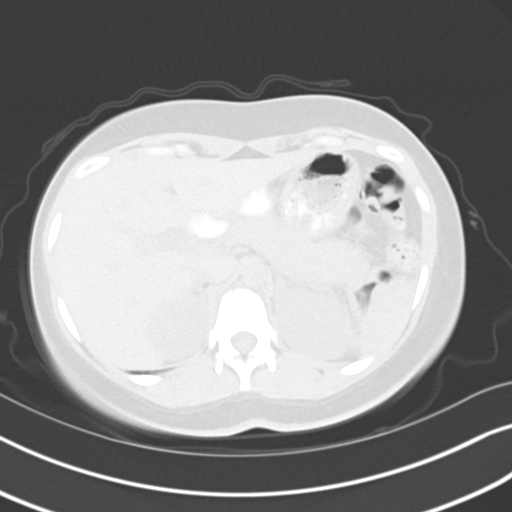
[im 79/86  lung]
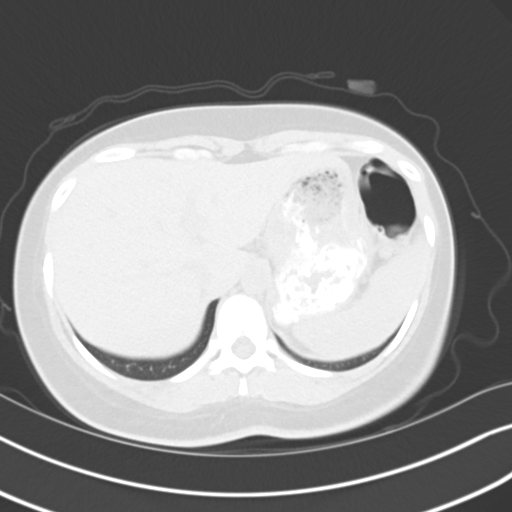
[im 82/86  soft-tissue]
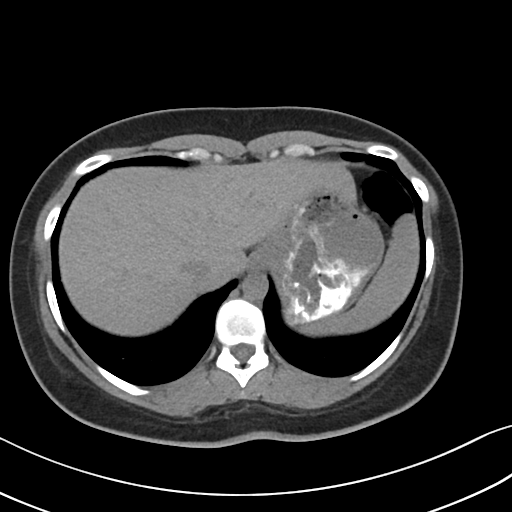
[im 82/86  lung]
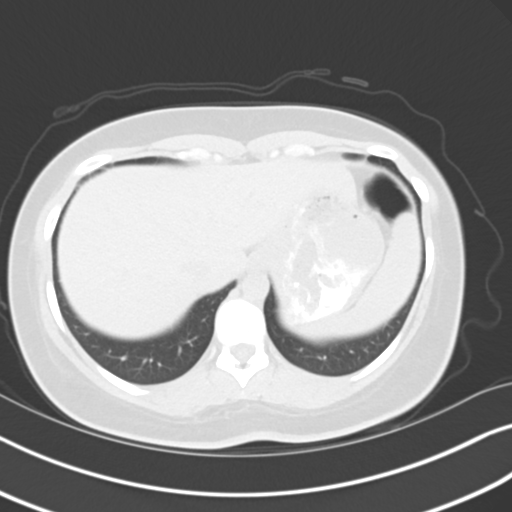

[15 of 32 positions shown; findings below may reference images not displayed]

FINDINGS: Lower chest: Clear lung bases. Normal heart size without pericardial
or pleural effusion.

Hepatobiliary: Partial exclusion of the hepatic dome. Normal
noncontrast appearance of the imaged liver, gallbladder, biliary
tract.

Pancreas: Normal, without mass or ductal dilatation.

Spleen: Normal in size, without focal abnormality.

Adrenals/Urinary Tract: Normal adrenal glands. No renal calculi or
hydronephrosis. No bladder calculi.

Stomach/Bowel: Normal stomach, without wall thickening. Colonic
stool burden suggests constipation. Normal terminal ileum and
appendix. Normal small bowel.

Vascular/Lymphatic: Normal caliber of the aorta and branch vessels.
No abdominopelvic adenopathy.

Reproductive: Retroverted uterus.  No adnexal mass.

Other: Trace free pelvic fluid is likely physiologic. Tiny fat
containing right paramidline ventral abdominal wall hernia on 41/2.
There may be minimal edema or fluid within the hernia.

Musculoskeletal: No acute osseous abnormality.
IMPRESSION: 1. Tiny fat containing right paramidline ventral abdominal wall
hernia. There may be minimal edema or fluid within the hernia.
Recommend clinical exclusion of incarceration.
2. Possible constipation.

## 2021-10-15 ENCOUNTER — Telehealth: Payer: Self-pay

## 2021-10-15 DIAGNOSIS — E559 Vitamin D deficiency, unspecified: Secondary | ICD-10-CM

## 2021-10-15 MED ORDER — VITAMIN D (ERGOCALCIFEROL) 1.25 MG (50000 UNIT) PO CAPS: ORAL_CAPSULE | ORAL | 0 refills | Status: DC

## 2021-10-15 NOTE — Telephone Encounter (Signed)
No additional notes needed  

## 2021-10-23 ENCOUNTER — Encounter: Payer: Self-pay | Admitting: Nurse Practitioner

## 2021-10-23 DIAGNOSIS — Z113 Encounter for screening for infections with a predominantly sexual mode of transmission: Secondary | ICD-10-CM | POA: Insufficient documentation

## 2021-10-23 NOTE — Assessment & Plan Note (Signed)
-   POCT URINALYSIS DIP (CLINITEK) - NuSwab Vaginitis Plus (VG+) - RPR+HIV+GC+CT Panel  2. Routine adult health maintenance  - TSH - CBC - Comprehensive metabolic panel - Vitamin D, 25-hydroxy - Lipid Panel  3. Vitamin D deficiency  - Vitamin D, 25-hydroxy - Vitamin D, Ergocalciferol, (DRISDOL) 1.25 MG (50000 UNIT) CAPS capsule; TAKE 1 CAPSULE BY MOUTH EVERY 7 DAYS FOR 8 DOSES  Dispense: 8 capsule; Refill: 0  4. Other headache syndrome  - ibuprofen (ADVIL) 800 MG tablet; Take 1 tablet (800 mg total) by mouth 3 (three) times daily as needed.  Dispense: 30 tablet; Refill: 1  5. Constipation, unspecified constipation type  - docusate sodium (COLACE) 100 MG capsule; Take 1 capsule (100 mg total) by mouth 2 (two) times daily.  Dispense: 10 capsule; Refill: 0   Follow up:   Follow up in 3 months follow up

## 2021-10-29 DIAGNOSIS — B3731 Acute candidiasis of vulva and vagina: Secondary | ICD-10-CM | POA: Diagnosis not present

## 2021-10-29 DIAGNOSIS — R300 Dysuria: Secondary | ICD-10-CM | POA: Diagnosis not present

## 2021-10-29 DIAGNOSIS — N898 Other specified noninflammatory disorders of vagina: Secondary | ICD-10-CM | POA: Diagnosis not present

## 2021-10-30 ENCOUNTER — Telehealth: Payer: Self-pay | Admitting: Nurse Practitioner

## 2021-10-30 NOTE — Telephone Encounter (Signed)
She should not need a referral. She just needs to call them to schedule a follow up. Thanks.

## 2021-11-02 DIAGNOSIS — H5213 Myopia, bilateral: Secondary | ICD-10-CM | POA: Diagnosis not present

## 2021-11-06 DIAGNOSIS — N925 Other specified irregular menstruation: Secondary | ICD-10-CM | POA: Diagnosis not present

## 2021-11-06 DIAGNOSIS — Z3202 Encounter for pregnancy test, result negative: Secondary | ICD-10-CM | POA: Diagnosis not present

## 2021-11-06 DIAGNOSIS — N939 Abnormal uterine and vaginal bleeding, unspecified: Secondary | ICD-10-CM | POA: Diagnosis not present

## 2021-11-06 DIAGNOSIS — K429 Umbilical hernia without obstruction or gangrene: Secondary | ICD-10-CM | POA: Diagnosis not present

## 2021-11-14 ENCOUNTER — Ambulatory Visit: Payer: Medicaid Other | Admitting: Nurse Practitioner

## 2021-11-14 NOTE — Progress Notes (Deleted)
   Acute Office Visit  Subjective:     Patient ID: Regina Warren, female    DOB: 30-May-1995, 26 y.o.   MRN: [id number]  No chief complaint on file.   HPI Patient is in today for ***  ROS      Objective:    There were no vitals taken for this visit. {Vitals History (Optional):23777}  Physical Exam  No results found for any visits on 11/14/21.      Assessment & Plan:   Problem List Items Addressed This Visit   None   No orders of the defined types were placed in this encounter.   No follow-ups on file.  Elyse Jarvis, RMA

## 2021-11-28 ENCOUNTER — Other Ambulatory Visit: Payer: Self-pay | Admitting: Nurse Practitioner

## 2021-11-28 DIAGNOSIS — G4489 Other headache syndrome: Secondary | ICD-10-CM

## 2021-11-28 MED ORDER — IBUPROFEN 800 MG PO TABS: 800.0000 mg | ORAL_TABLET | Freq: Three times a day (TID) | ORAL | 1 refills | Status: DC | PRN

## 2021-11-28 NOTE — Telephone Encounter (Signed)
Hi, can I please have my 800 mg IBU refilled please. [facility] on [address] in [place] is waiting on Provider's approval.   Thanks and have a great day

## 2021-12-07 ENCOUNTER — Other Ambulatory Visit: Payer: Self-pay | Admitting: Nurse Practitioner

## 2021-12-07 DIAGNOSIS — E559 Vitamin D deficiency, unspecified: Secondary | ICD-10-CM

## 2021-12-31 ENCOUNTER — Other Ambulatory Visit: Payer: Self-pay | Admitting: Nurse Practitioner

## 2021-12-31 DIAGNOSIS — G4489 Other headache syndrome: Secondary | ICD-10-CM

## 2021-12-31 MED ORDER — IBUPROFEN 800 MG PO TABS: 800.0000 mg | ORAL_TABLET | Freq: Three times a day (TID) | ORAL | 0 refills | Status: DC | PRN

## 2021-12-31 NOTE — Telephone Encounter (Signed)
Please advise 

## 2022-01-14 DIAGNOSIS — Z01419 Encounter for gynecological examination (general) (routine) without abnormal findings: Secondary | ICD-10-CM | POA: Diagnosis not present

## 2022-01-14 DIAGNOSIS — Z1389 Encounter for screening for other disorder: Secondary | ICD-10-CM | POA: Diagnosis not present

## 2022-01-14 DIAGNOSIS — Z130 Encounter for screening for diseases of the blood and blood-forming organs and certain disorders involving the immune mechanism: Secondary | ICD-10-CM | POA: Diagnosis not present

## 2022-01-14 DIAGNOSIS — K5900 Constipation, unspecified: Secondary | ICD-10-CM | POA: Diagnosis not present

## 2022-01-14 DIAGNOSIS — R1032 Left lower quadrant pain: Secondary | ICD-10-CM | POA: Diagnosis not present

## 2022-02-10 DIAGNOSIS — L700 Acne vulgaris: Secondary | ICD-10-CM | POA: Diagnosis not present

## 2022-03-21 ENCOUNTER — Other Ambulatory Visit: Payer: Self-pay | Admitting: Nurse Practitioner

## 2022-03-21 DIAGNOSIS — G4489 Other headache syndrome: Secondary | ICD-10-CM

## 2022-04-30 DIAGNOSIS — L239 Allergic contact dermatitis, unspecified cause: Secondary | ICD-10-CM | POA: Diagnosis not present

## 2022-05-13 DIAGNOSIS — J069 Acute upper respiratory infection, unspecified: Secondary | ICD-10-CM | POA: Diagnosis not present

## 2022-05-15 ENCOUNTER — Other Ambulatory Visit: Payer: Self-pay

## 2022-05-15 DIAGNOSIS — E559 Vitamin D deficiency, unspecified: Secondary | ICD-10-CM

## 2022-05-15 MED ORDER — VITAMIN D (ERGOCALCIFEROL) 1.25 MG (50000 UNIT) PO CAPS: ORAL_CAPSULE | ORAL | 0 refills | Status: DC

## 2022-05-15 NOTE — Telephone Encounter (Signed)
Please advise KH 

## 2022-06-09 ENCOUNTER — Telehealth: Payer: Self-pay | Admitting: Nurse Practitioner

## 2022-06-09 ENCOUNTER — Other Ambulatory Visit: Payer: Self-pay | Admitting: Nurse Practitioner

## 2022-06-09 DIAGNOSIS — G4489 Other headache syndrome: Secondary | ICD-10-CM

## 2022-06-09 MED ORDER — IBUPROFEN 800 MG PO TABS: ORAL_TABLET | ORAL | 0 refills | Status: DC

## 2022-06-09 NOTE — Telephone Encounter (Signed)
Pt requesting expedite her ibuprofen prescription. States she has been needing it since last week and has never had to wait this long to get it

## 2022-06-09 NOTE — Telephone Encounter (Signed)
Caller & Relationship to patient:  MRN #  [id number]   Call Back Number:   Date of Last Office Visit: 05/15/2022     Date of Next Office Visit: 06/18/2022    Medication(s) to be Refilled: ibuprofen   Preferred Pharmacy:   ** Please notify patient to allow 48-72 hours to process** **Let patient know to contact pharmacy at the end of the day to make sure medication is ready. ** **If patient has not been seen in a year or longer, book an appointment **Advise to use MyChart for refill requests OR to contact their pharmacy

## 2022-06-11 ENCOUNTER — Other Ambulatory Visit: Payer: Self-pay | Admitting: Nurse Practitioner

## 2022-06-11 DIAGNOSIS — G4489 Other headache syndrome: Secondary | ICD-10-CM

## 2022-06-11 MED ORDER — IBUPROFEN 800 MG PO TABS: ORAL_TABLET | ORAL | 0 refills | Status: DC

## 2022-06-18 ENCOUNTER — Ambulatory Visit: Payer: Self-pay | Admitting: Nurse Practitioner

## 2022-06-26 ENCOUNTER — Ambulatory Visit: Payer: Self-pay | Admitting: Nurse Practitioner

## 2022-06-26 ENCOUNTER — Ambulatory Visit: Payer: Medicaid Other | Admitting: Nurse Practitioner

## 2022-06-26 ENCOUNTER — Encounter: Payer: Self-pay | Admitting: Nurse Practitioner

## 2022-06-26 VITALS — BP 132/87 | HR 72 | Temp 97.2°F | Ht 63.0 in | Wt 187.0 lb

## 2022-06-26 DIAGNOSIS — K5909 Other constipation: Secondary | ICD-10-CM

## 2022-06-26 DIAGNOSIS — Z13220 Encounter for screening for lipoid disorders: Secondary | ICD-10-CM

## 2022-06-26 DIAGNOSIS — Z1329 Encounter for screening for other suspected endocrine disorder: Secondary | ICD-10-CM | POA: Diagnosis not present

## 2022-06-26 DIAGNOSIS — Z6831 Body mass index (BMI) 31.0-31.9, adult: Secondary | ICD-10-CM | POA: Diagnosis not present

## 2022-06-26 DIAGNOSIS — Z0000 Encounter for general adult medical examination without abnormal findings: Secondary | ICD-10-CM | POA: Diagnosis not present

## 2022-06-26 DIAGNOSIS — L810 Postinflammatory hyperpigmentation: Secondary | ICD-10-CM | POA: Diagnosis not present

## 2022-06-26 DIAGNOSIS — Z13228 Encounter for screening for other metabolic disorders: Secondary | ICD-10-CM | POA: Diagnosis not present

## 2022-06-26 DIAGNOSIS — E6609 Other obesity due to excess calories: Secondary | ICD-10-CM

## 2022-06-26 DIAGNOSIS — M545 Low back pain, unspecified: Secondary | ICD-10-CM | POA: Diagnosis not present

## 2022-06-26 DIAGNOSIS — E559 Vitamin D deficiency, unspecified: Secondary | ICD-10-CM | POA: Diagnosis not present

## 2022-06-26 DIAGNOSIS — L700 Acne vulgaris: Secondary | ICD-10-CM | POA: Diagnosis not present

## 2022-06-26 DIAGNOSIS — G8929 Other chronic pain: Secondary | ICD-10-CM | POA: Diagnosis not present

## 2022-06-26 LAB — POCT GLYCOSYLATED HEMOGLOBIN (HGB A1C): Hemoglobin A1C: 5 % (ref 4.0–5.6)

## 2022-06-26 NOTE — Assessment & Plan Note (Signed)
-   Vitamin D, 25-hydroxy  2. Lipid screening  - Lipid Panel  3. Routine adult health maintenance  - CBC - Comprehensive metabolic panel  4. Thyroid disorder screen  - TSH  5. Chronic constipation  - Ambulatory referral to Gastroenterology    Follow up:  Follow up in 1 year

## 2022-06-26 NOTE — Progress Notes (Signed)
@Patient  ID: Regina Warren, female    DOB: August 12, 1995, 27 y.o.   MRN: [id number]  Chief Complaint  Patient presents with   Annual Exam    Referring provider: Ivonne Andrew, NP   HPI  Patient presents today for annual physical.  Patient will need labs today.  She does have a history of vitamin D deficiency and we will recheck this today.  Patient would like diabetic screening.  A1c in office today was 5.0.  Patient is active and is going to start working on weight loss.  She is trying to stay healthy. Denies f/c/s, n/v/d, hemoptysis, PND, leg swelling Denies chest pain or edema        Allergies  Allergen Reactions   Imitrex [Sumatriptan]     "made my brain feel like it was about to explode"    Immunization History  Administered Date(s) Administered   Influenza,inj,Quad PF,6+ Mos 01/14/2017, 01/06/2018, 10/05/2018, 10/04/2019    Past Medical History:  Diagnosis Date   Abdominal pain    Anxiety    Discomfort of back 03/2019   Vitamin D deficiency 06/2018    Tobacco History: Social History   Tobacco Use  Smoking Status Never  Smokeless Tobacco Never   Counseling given: Not Answered   Outpatient Encounter Medications as of 06/26/2022  Medication Sig   AMBULATORY NON FORMULARY MEDICATION Nitroglycerine ointment 0.125 %  Apply a pea sized amount internally three-four times daily. Dispense 30 GM 1 refill (Patient taking differently: Nitroglycerine ointment 0.125 %  Apply a pea sized amount internally three-four times daily as needed Dispense 30 GM 1 refill)   benzoyl peroxide-erythromycin (BENZAMYCIN) gel Apply 1 a small amount to affected area once a day   ibuprofen (ADVIL) 800 MG tablet TAKE 1 TABLET(800 MG) BY MOUTH THREE TIMES DAILY AS NEEDED   Vitamin D, Ergocalciferol, (DRISDOL) 1.25 MG (50000 UNIT) CAPS capsule TAKE 1 CAPSULE BY MOUTH EVERY 7 DAYS FOR 8 DOSES   docusate sodium (COLACE) 100 MG capsule Take 1 capsule (100 mg total) by mouth 2  (two) times daily. (Patient not taking: Reported on 06/26/2022)   No facility-administered encounter medications on file as of 06/26/2022.     Review of Systems  Review of Systems  Constitutional: Negative.   HENT: Negative.    Cardiovascular: Negative.   Gastrointestinal: Negative.   Allergic/Immunologic: Negative.   Neurological: Negative.   Psychiatric/Behavioral: Negative.         Physical Exam  BP 132/87   Pulse 72   Temp (!) 97.2 F (36.2 C)   Ht 5\' 3"  (1.6 m)   Wt 187 lb (84.8 kg)   LMP 06/23/2022   SpO2 100%   BMI 33.13 kg/m   Wt Readings from Last 5 Encounters:  06/26/22 187 lb (84.8 kg)  08/15/21 172 lb 6 oz (78.2 kg)  11/02/20 162 lb 0.8 oz (73.5 kg)  09/17/20 162 lb 0.8 oz (73.5 kg)  04/27/20 162 lb (73.5 kg)     Physical Exam Vitals and nursing note reviewed.  Constitutional:      General: She is not in acute distress.    Appearance: She is well-developed.  Cardiovascular:     Rate and Rhythm: Normal rate and regular rhythm.  Pulmonary:     Effort: Pulmonary effort is normal.     Breath sounds: Normal breath sounds.  Neurological:     Mental Status: She is alert and oriented to person, place, and time.      Lab Results:  CBC    Component Value Date/Time   WBC 4.7 08/15/2021 1630   WBC 6.2 10/07/2017 0107   RBC 3.96 08/15/2021 1630   RBC 3.96 10/07/2017 0107   HGB 12.8 08/15/2021 1630   HCT 37.5 08/15/2021 1630   PLT 276 08/15/2021 1630   MCV 95 08/15/2021 1630   MCH 32.3 08/15/2021 1630   MCH 32.1 10/07/2017 0107   MCHC 34.1 08/15/2021 1630   MCHC 33.3 10/07/2017 0107   RDW 11.8 08/15/2021 1630   LYMPHSABS 1.3 09/17/2020 1102   MONOABS 232 05/07/2016 1110   EOSABS 0.0 09/17/2020 1102   BASOSABS 0.0 09/17/2020 1102    BMET    Component Value Date/Time   NA 137 08/15/2021 1630   K 4.1 08/15/2021 1630   CL 104 08/15/2021 1630   CO2 21 08/15/2021 1630   GLUCOSE 85 08/15/2021 1630   GLUCOSE 89 10/07/2017 0107   BUN 9  08/15/2021 1630   CREATININE 0.92 08/15/2021 1630   CREATININE 0.75 05/07/2016 1110   CALCIUM 9.2 08/15/2021 1630   GFRNONAA 121 07/25/2019 0951   GFRNONAA >89 05/07/2016 1110   GFRAA 139 07/25/2019 0951   GFRAA >89 05/07/2016 1110     Assessment & Plan:   Vitamin D deficiency - Vitamin D, 25-hydroxy  2. Lipid screening  - Lipid Panel  3. Routine adult health maintenance  - CBC - Comprehensive metabolic panel  4. Thyroid disorder screen  - TSH  5. Chronic constipation  - Ambulatory referral to Gastroenterology    Follow up:  Follow up in 1 year     Ivonne Andrew, NP 06/26/2022

## 2022-06-26 NOTE — Addendum Note (Signed)
Addended byRaj Janus on: 06/26/2022 10:13 AM   Modules accepted: Orders

## 2022-06-26 NOTE — Patient Instructions (Addendum)
1. Vitamin D deficiency  - Vitamin D, 25-hydroxy  2. Lipid screening  - Lipid Panel  3. Routine adult health maintenance  - CBC - Comprehensive metabolic panel  4. Thyroid disorder screen  - TSH  5. Chronic constipation  - Ambulatory referral to Gastroenterology    Follow up:  Follow up in 1 year

## 2022-06-27 LAB — CBC
Hematocrit: 38.5 % (ref 34.0–46.6)
Hemoglobin: 13.3 g/dL (ref 11.1–15.9)
MCH: 32.3 pg (ref 26.6–33.0)
MCHC: 34.5 g/dL (ref 31.5–35.7)
MCV: 93 fL (ref 79–97)
Platelets: 304 10*3/uL (ref 150–450)
RBC: 4.12 x10E6/uL (ref 3.77–5.28)
RDW: 11.9 % (ref 11.7–15.4)
WBC: 3.6 10*3/uL (ref 3.4–10.8)

## 2022-06-27 LAB — COMPREHENSIVE METABOLIC PANEL
ALT: 19 IU/L (ref 0–32)
AST: 17 IU/L (ref 0–40)
Albumin/Globulin Ratio: 1.6 (ref 1.2–2.2)
Albumin: 4.4 g/dL (ref 4.0–5.0)
Alkaline Phosphatase: 76 IU/L (ref 44–121)
BUN/Creatinine Ratio: 10 (ref 9–23)
BUN: 7 mg/dL (ref 6–20)
Bilirubin Total: 0.3 mg/dL (ref 0.0–1.2)
CO2: 22 mmol/L (ref 20–29)
Calcium: 9.5 mg/dL (ref 8.7–10.2)
Chloride: 102 mmol/L (ref 96–106)
Creatinine, Ser: 0.69 mg/dL (ref 0.57–1.00)
Globulin, Total: 2.7 g/dL (ref 1.5–4.5)
Glucose: 99 mg/dL (ref 70–99)
Potassium: 4.7 mmol/L (ref 3.5–5.2)
Sodium: 137 mmol/L (ref 134–144)
Total Protein: 7.1 g/dL (ref 6.0–8.5)
eGFR: 121 mL/min/{1.73_m2} (ref >59)

## 2022-06-27 LAB — LIPID PANEL
Chol/HDL Ratio: 2.9 ratio (ref 0.0–4.4)
Cholesterol, Total: 153 mg/dL (ref 100–199)
HDL: 53 mg/dL (ref >39)
LDL Chol Calc (NIH): 93 mg/dL (ref 0–99)
Triglycerides: 27 mg/dL (ref 0–149)
VLDL Cholesterol Cal: 7 mg/dL (ref 5–40)

## 2022-06-27 LAB — VITAMIN D 25 HYDROXY (VIT D DEFICIENCY, FRACTURES): Vit D, 25-Hydroxy: 22.8 ng/mL — ABNORMAL LOW (ref 30.0–100.0)

## 2022-06-27 LAB — TSH: TSH: 1.68 u[IU]/mL (ref 0.450–4.500)

## 2022-06-27 NOTE — Progress Notes (Signed)
Called pt and inform lab results. Gh 

## 2022-07-01 DIAGNOSIS — L723 Sebaceous cyst: Secondary | ICD-10-CM | POA: Diagnosis not present

## 2022-07-01 DIAGNOSIS — Z124 Encounter for screening for malignant neoplasm of cervix: Secondary | ICD-10-CM | POA: Diagnosis not present

## 2022-07-01 DIAGNOSIS — N945 Secondary dysmenorrhea: Secondary | ICD-10-CM | POA: Diagnosis not present

## 2022-07-03 ENCOUNTER — Telehealth: Payer: Self-pay | Admitting: Nurse Practitioner

## 2022-07-03 NOTE — Telephone Encounter (Signed)
Pt asking about her GI and Chiropractic referral and hasn't heard anything.Marland Kitchen   She was told that the referral coordinator was here and wants to know if someone else can handle this?

## 2022-07-08 ENCOUNTER — Telehealth: Payer: Self-pay | Admitting: Nurse Practitioner

## 2022-07-08 ENCOUNTER — Telehealth: Payer: Self-pay

## 2022-07-08 DIAGNOSIS — G8929 Other chronic pain: Secondary | ICD-10-CM

## 2022-07-08 NOTE — Telephone Encounter (Signed)
Called pt to follow up on this referral pt needs these two referral to be send in office in [place]. Gh

## 2022-07-08 NOTE — Telephone Encounter (Signed)
This pt has call again about her referral's .Marland KitchenMarland Kitchenplease call on the status cause she is getting in patient but just need someone to call her and update her.

## 2022-07-08 NOTE — Telephone Encounter (Signed)
Caller & Relationship to patient:  MRN #  [id number]   Call Back Number:   Date of Last Office Visit: 07/03/2022     Date of Next Office Visit: Visit date not found    Medication(s) to be Refilled: Ibuprofen  Preferred Pharmacy:   ** Please notify patient to allow 48-72 hours to process** **Let patient know to contact pharmacy at the end of the day to make sure medication is ready. ** **If patient has not been seen in a year or longer, book an appointment **Advise to use MyChart for refill requests OR to contact their pharmacy

## 2022-07-09 ENCOUNTER — Other Ambulatory Visit: Payer: Self-pay | Admitting: Nurse Practitioner

## 2022-07-09 DIAGNOSIS — G4489 Other headache syndrome: Secondary | ICD-10-CM

## 2022-07-09 MED ORDER — IBUPROFEN 800 MG PO TABS
ORAL_TABLET | ORAL | 0 refills | Status: DC
Start: 2022-07-09 — End: 2023-10-23

## 2022-07-09 NOTE — Telephone Encounter (Signed)
Per provider medication sent in today. Gh

## 2022-07-14 ENCOUNTER — Other Ambulatory Visit: Payer: Self-pay | Admitting: Nurse Practitioner

## 2022-07-14 DIAGNOSIS — E559 Vitamin D deficiency, unspecified: Secondary | ICD-10-CM

## 2022-07-14 NOTE — Telephone Encounter (Signed)
Please advise KH 

## 2022-07-16 DIAGNOSIS — M545 Low back pain, unspecified: Secondary | ICD-10-CM | POA: Diagnosis not present

## 2022-07-16 DIAGNOSIS — M9903 Segmental and somatic dysfunction of lumbar region: Secondary | ICD-10-CM | POA: Diagnosis not present

## 2022-07-16 DIAGNOSIS — M546 Pain in thoracic spine: Secondary | ICD-10-CM | POA: Diagnosis not present

## 2022-07-16 DIAGNOSIS — M9901 Segmental and somatic dysfunction of cervical region: Secondary | ICD-10-CM | POA: Diagnosis not present

## 2022-07-16 DIAGNOSIS — M542 Cervicalgia: Secondary | ICD-10-CM | POA: Diagnosis not present

## 2022-07-16 DIAGNOSIS — M62830 Muscle spasm of back: Secondary | ICD-10-CM | POA: Diagnosis not present

## 2022-07-16 DIAGNOSIS — M9902 Segmental and somatic dysfunction of thoracic region: Secondary | ICD-10-CM | POA: Diagnosis not present

## 2022-07-16 DIAGNOSIS — M9905 Segmental and somatic dysfunction of pelvic region: Secondary | ICD-10-CM | POA: Diagnosis not present

## 2022-07-17 DIAGNOSIS — M9905 Segmental and somatic dysfunction of pelvic region: Secondary | ICD-10-CM | POA: Diagnosis not present

## 2022-07-17 DIAGNOSIS — M9901 Segmental and somatic dysfunction of cervical region: Secondary | ICD-10-CM | POA: Diagnosis not present

## 2022-07-17 DIAGNOSIS — M9903 Segmental and somatic dysfunction of lumbar region: Secondary | ICD-10-CM | POA: Diagnosis not present

## 2022-07-17 DIAGNOSIS — M546 Pain in thoracic spine: Secondary | ICD-10-CM | POA: Diagnosis not present

## 2022-07-17 DIAGNOSIS — M542 Cervicalgia: Secondary | ICD-10-CM | POA: Diagnosis not present

## 2022-07-17 DIAGNOSIS — M9902 Segmental and somatic dysfunction of thoracic region: Secondary | ICD-10-CM | POA: Diagnosis not present

## 2022-07-17 DIAGNOSIS — M62830 Muscle spasm of back: Secondary | ICD-10-CM | POA: Diagnosis not present

## 2022-07-17 DIAGNOSIS — M545 Low back pain, unspecified: Secondary | ICD-10-CM | POA: Diagnosis not present

## 2022-07-18 DIAGNOSIS — M9902 Segmental and somatic dysfunction of thoracic region: Secondary | ICD-10-CM | POA: Diagnosis not present

## 2022-07-18 DIAGNOSIS — M545 Low back pain, unspecified: Secondary | ICD-10-CM | POA: Diagnosis not present

## 2022-07-18 DIAGNOSIS — M62830 Muscle spasm of back: Secondary | ICD-10-CM | POA: Diagnosis not present

## 2022-07-18 DIAGNOSIS — M542 Cervicalgia: Secondary | ICD-10-CM | POA: Diagnosis not present

## 2022-07-18 DIAGNOSIS — M9905 Segmental and somatic dysfunction of pelvic region: Secondary | ICD-10-CM | POA: Diagnosis not present

## 2022-07-18 DIAGNOSIS — M9903 Segmental and somatic dysfunction of lumbar region: Secondary | ICD-10-CM | POA: Diagnosis not present

## 2022-07-18 DIAGNOSIS — M9901 Segmental and somatic dysfunction of cervical region: Secondary | ICD-10-CM | POA: Diagnosis not present

## 2022-07-18 DIAGNOSIS — M546 Pain in thoracic spine: Secondary | ICD-10-CM | POA: Diagnosis not present

## 2022-07-21 DIAGNOSIS — M9902 Segmental and somatic dysfunction of thoracic region: Secondary | ICD-10-CM | POA: Diagnosis not present

## 2022-07-21 DIAGNOSIS — M62830 Muscle spasm of back: Secondary | ICD-10-CM | POA: Diagnosis not present

## 2022-07-21 DIAGNOSIS — M9903 Segmental and somatic dysfunction of lumbar region: Secondary | ICD-10-CM | POA: Diagnosis not present

## 2022-07-21 DIAGNOSIS — M546 Pain in thoracic spine: Secondary | ICD-10-CM | POA: Diagnosis not present

## 2022-07-21 DIAGNOSIS — M545 Low back pain, unspecified: Secondary | ICD-10-CM | POA: Diagnosis not present

## 2022-07-21 DIAGNOSIS — M9901 Segmental and somatic dysfunction of cervical region: Secondary | ICD-10-CM | POA: Diagnosis not present

## 2022-07-21 DIAGNOSIS — M9905 Segmental and somatic dysfunction of pelvic region: Secondary | ICD-10-CM | POA: Diagnosis not present

## 2022-07-21 DIAGNOSIS — M542 Cervicalgia: Secondary | ICD-10-CM | POA: Diagnosis not present

## 2022-07-24 DIAGNOSIS — M545 Low back pain, unspecified: Secondary | ICD-10-CM | POA: Diagnosis not present

## 2022-07-24 DIAGNOSIS — M542 Cervicalgia: Secondary | ICD-10-CM | POA: Diagnosis not present

## 2022-07-24 DIAGNOSIS — M9901 Segmental and somatic dysfunction of cervical region: Secondary | ICD-10-CM | POA: Diagnosis not present

## 2022-07-24 DIAGNOSIS — M546 Pain in thoracic spine: Secondary | ICD-10-CM | POA: Diagnosis not present

## 2022-07-24 DIAGNOSIS — M9905 Segmental and somatic dysfunction of pelvic region: Secondary | ICD-10-CM | POA: Diagnosis not present

## 2022-07-24 DIAGNOSIS — M62830 Muscle spasm of back: Secondary | ICD-10-CM | POA: Diagnosis not present

## 2022-07-24 DIAGNOSIS — M9903 Segmental and somatic dysfunction of lumbar region: Secondary | ICD-10-CM | POA: Diagnosis not present

## 2022-07-24 DIAGNOSIS — M9902 Segmental and somatic dysfunction of thoracic region: Secondary | ICD-10-CM | POA: Diagnosis not present

## 2022-07-25 DIAGNOSIS — M546 Pain in thoracic spine: Secondary | ICD-10-CM | POA: Diagnosis not present

## 2022-07-25 DIAGNOSIS — M545 Low back pain, unspecified: Secondary | ICD-10-CM | POA: Diagnosis not present

## 2022-07-25 DIAGNOSIS — M9901 Segmental and somatic dysfunction of cervical region: Secondary | ICD-10-CM | POA: Diagnosis not present

## 2022-07-25 DIAGNOSIS — M62830 Muscle spasm of back: Secondary | ICD-10-CM | POA: Diagnosis not present

## 2022-07-25 DIAGNOSIS — M9905 Segmental and somatic dysfunction of pelvic region: Secondary | ICD-10-CM | POA: Diagnosis not present

## 2022-07-25 DIAGNOSIS — M542 Cervicalgia: Secondary | ICD-10-CM | POA: Diagnosis not present

## 2022-07-25 DIAGNOSIS — M9902 Segmental and somatic dysfunction of thoracic region: Secondary | ICD-10-CM | POA: Diagnosis not present

## 2022-07-25 DIAGNOSIS — M9903 Segmental and somatic dysfunction of lumbar region: Secondary | ICD-10-CM | POA: Diagnosis not present

## 2022-08-06 DIAGNOSIS — M9903 Segmental and somatic dysfunction of lumbar region: Secondary | ICD-10-CM | POA: Diagnosis not present

## 2022-08-06 DIAGNOSIS — M545 Low back pain, unspecified: Secondary | ICD-10-CM | POA: Diagnosis not present

## 2022-08-06 DIAGNOSIS — M9905 Segmental and somatic dysfunction of pelvic region: Secondary | ICD-10-CM | POA: Diagnosis not present

## 2022-08-06 DIAGNOSIS — M9901 Segmental and somatic dysfunction of cervical region: Secondary | ICD-10-CM | POA: Diagnosis not present

## 2022-08-06 DIAGNOSIS — N945 Secondary dysmenorrhea: Secondary | ICD-10-CM | POA: Diagnosis not present

## 2022-08-06 DIAGNOSIS — M546 Pain in thoracic spine: Secondary | ICD-10-CM | POA: Diagnosis not present

## 2022-08-06 DIAGNOSIS — M9902 Segmental and somatic dysfunction of thoracic region: Secondary | ICD-10-CM | POA: Diagnosis not present

## 2022-08-06 DIAGNOSIS — M542 Cervicalgia: Secondary | ICD-10-CM | POA: Diagnosis not present

## 2022-08-06 DIAGNOSIS — M62830 Muscle spasm of back: Secondary | ICD-10-CM | POA: Diagnosis not present

## 2022-09-23 ENCOUNTER — Other Ambulatory Visit: Payer: Self-pay

## 2022-09-23 DIAGNOSIS — E559 Vitamin D deficiency, unspecified: Secondary | ICD-10-CM

## 2022-09-23 MED ORDER — VITAMIN D (ERGOCALCIFEROL) 1.25 MG (50000 UNIT) PO CAPS
ORAL_CAPSULE | ORAL | 0 refills | Status: DC
Start: 2022-09-23 — End: 2023-02-23

## 2022-09-23 NOTE — Progress Notes (Signed)
Must complete Vit D labs before another refill

## 2022-10-16 DIAGNOSIS — N3000 Acute cystitis without hematuria: Secondary | ICD-10-CM | POA: Diagnosis not present

## 2022-10-20 DIAGNOSIS — M79672 Pain in left foot: Secondary | ICD-10-CM | POA: Diagnosis not present

## 2022-10-20 DIAGNOSIS — N83202 Unspecified ovarian cyst, left side: Secondary | ICD-10-CM | POA: Diagnosis not present

## 2022-10-20 DIAGNOSIS — R1031 Right lower quadrant pain: Secondary | ICD-10-CM | POA: Diagnosis not present

## 2022-11-07 DIAGNOSIS — M62830 Muscle spasm of back: Secondary | ICD-10-CM | POA: Diagnosis not present

## 2022-11-07 DIAGNOSIS — M546 Pain in thoracic spine: Secondary | ICD-10-CM | POA: Diagnosis not present

## 2022-11-07 DIAGNOSIS — M9901 Segmental and somatic dysfunction of cervical region: Secondary | ICD-10-CM | POA: Diagnosis not present

## 2022-11-07 DIAGNOSIS — M9905 Segmental and somatic dysfunction of pelvic region: Secondary | ICD-10-CM | POA: Diagnosis not present

## 2022-11-07 DIAGNOSIS — M545 Low back pain, unspecified: Secondary | ICD-10-CM | POA: Diagnosis not present

## 2022-11-07 DIAGNOSIS — M9902 Segmental and somatic dysfunction of thoracic region: Secondary | ICD-10-CM | POA: Diagnosis not present

## 2022-11-07 DIAGNOSIS — M542 Cervicalgia: Secondary | ICD-10-CM | POA: Diagnosis not present

## 2022-11-07 DIAGNOSIS — M9903 Segmental and somatic dysfunction of lumbar region: Secondary | ICD-10-CM | POA: Diagnosis not present

## 2022-11-13 DIAGNOSIS — M9905 Segmental and somatic dysfunction of pelvic region: Secondary | ICD-10-CM | POA: Diagnosis not present

## 2022-11-13 DIAGNOSIS — M62830 Muscle spasm of back: Secondary | ICD-10-CM | POA: Diagnosis not present

## 2022-11-13 DIAGNOSIS — M9902 Segmental and somatic dysfunction of thoracic region: Secondary | ICD-10-CM | POA: Diagnosis not present

## 2022-11-13 DIAGNOSIS — M542 Cervicalgia: Secondary | ICD-10-CM | POA: Diagnosis not present

## 2022-11-13 DIAGNOSIS — M546 Pain in thoracic spine: Secondary | ICD-10-CM | POA: Diagnosis not present

## 2022-11-13 DIAGNOSIS — M545 Low back pain, unspecified: Secondary | ICD-10-CM | POA: Diagnosis not present

## 2022-11-13 DIAGNOSIS — M9903 Segmental and somatic dysfunction of lumbar region: Secondary | ICD-10-CM | POA: Diagnosis not present

## 2022-11-13 DIAGNOSIS — M9901 Segmental and somatic dysfunction of cervical region: Secondary | ICD-10-CM | POA: Diagnosis not present

## 2022-11-28 DIAGNOSIS — M9902 Segmental and somatic dysfunction of thoracic region: Secondary | ICD-10-CM | POA: Diagnosis not present

## 2022-11-28 DIAGNOSIS — M542 Cervicalgia: Secondary | ICD-10-CM | POA: Diagnosis not present

## 2022-11-28 DIAGNOSIS — M546 Pain in thoracic spine: Secondary | ICD-10-CM | POA: Diagnosis not present

## 2022-11-28 DIAGNOSIS — M9901 Segmental and somatic dysfunction of cervical region: Secondary | ICD-10-CM | POA: Diagnosis not present

## 2022-11-28 DIAGNOSIS — M9905 Segmental and somatic dysfunction of pelvic region: Secondary | ICD-10-CM | POA: Diagnosis not present

## 2022-11-28 DIAGNOSIS — M545 Low back pain, unspecified: Secondary | ICD-10-CM | POA: Diagnosis not present

## 2022-11-28 DIAGNOSIS — M62830 Muscle spasm of back: Secondary | ICD-10-CM | POA: Diagnosis not present

## 2022-11-28 DIAGNOSIS — M9903 Segmental and somatic dysfunction of lumbar region: Secondary | ICD-10-CM | POA: Diagnosis not present

## 2022-12-05 DIAGNOSIS — M542 Cervicalgia: Secondary | ICD-10-CM | POA: Diagnosis not present

## 2022-12-05 DIAGNOSIS — M9902 Segmental and somatic dysfunction of thoracic region: Secondary | ICD-10-CM | POA: Diagnosis not present

## 2022-12-05 DIAGNOSIS — M546 Pain in thoracic spine: Secondary | ICD-10-CM | POA: Diagnosis not present

## 2022-12-05 DIAGNOSIS — M62830 Muscle spasm of back: Secondary | ICD-10-CM | POA: Diagnosis not present

## 2022-12-05 DIAGNOSIS — M545 Low back pain, unspecified: Secondary | ICD-10-CM | POA: Diagnosis not present

## 2022-12-05 DIAGNOSIS — M9903 Segmental and somatic dysfunction of lumbar region: Secondary | ICD-10-CM | POA: Diagnosis not present

## 2022-12-05 DIAGNOSIS — M9905 Segmental and somatic dysfunction of pelvic region: Secondary | ICD-10-CM | POA: Diagnosis not present

## 2022-12-05 DIAGNOSIS — M9901 Segmental and somatic dysfunction of cervical region: Secondary | ICD-10-CM | POA: Diagnosis not present

## 2022-12-19 DIAGNOSIS — M545 Low back pain, unspecified: Secondary | ICD-10-CM | POA: Diagnosis not present

## 2022-12-19 DIAGNOSIS — M546 Pain in thoracic spine: Secondary | ICD-10-CM | POA: Diagnosis not present

## 2022-12-19 DIAGNOSIS — M542 Cervicalgia: Secondary | ICD-10-CM | POA: Diagnosis not present

## 2022-12-19 DIAGNOSIS — M62830 Muscle spasm of back: Secondary | ICD-10-CM | POA: Diagnosis not present

## 2022-12-19 DIAGNOSIS — M9903 Segmental and somatic dysfunction of lumbar region: Secondary | ICD-10-CM | POA: Diagnosis not present

## 2022-12-19 DIAGNOSIS — M9902 Segmental and somatic dysfunction of thoracic region: Secondary | ICD-10-CM | POA: Diagnosis not present

## 2022-12-19 DIAGNOSIS — M9905 Segmental and somatic dysfunction of pelvic region: Secondary | ICD-10-CM | POA: Diagnosis not present

## 2022-12-19 DIAGNOSIS — M9901 Segmental and somatic dysfunction of cervical region: Secondary | ICD-10-CM | POA: Diagnosis not present

## 2022-12-31 DIAGNOSIS — M9905 Segmental and somatic dysfunction of pelvic region: Secondary | ICD-10-CM | POA: Diagnosis not present

## 2022-12-31 DIAGNOSIS — M9901 Segmental and somatic dysfunction of cervical region: Secondary | ICD-10-CM | POA: Diagnosis not present

## 2022-12-31 DIAGNOSIS — M542 Cervicalgia: Secondary | ICD-10-CM | POA: Diagnosis not present

## 2022-12-31 DIAGNOSIS — M62830 Muscle spasm of back: Secondary | ICD-10-CM | POA: Diagnosis not present

## 2022-12-31 DIAGNOSIS — M9902 Segmental and somatic dysfunction of thoracic region: Secondary | ICD-10-CM | POA: Diagnosis not present

## 2022-12-31 DIAGNOSIS — M546 Pain in thoracic spine: Secondary | ICD-10-CM | POA: Diagnosis not present

## 2022-12-31 DIAGNOSIS — M545 Low back pain, unspecified: Secondary | ICD-10-CM | POA: Diagnosis not present

## 2022-12-31 DIAGNOSIS — M9903 Segmental and somatic dysfunction of lumbar region: Secondary | ICD-10-CM | POA: Diagnosis not present

## 2023-01-22 ENCOUNTER — Telehealth: Payer: Self-pay

## 2023-01-22 NOTE — Telephone Encounter (Signed)
Copied from CRM [id number]. Topic: Clinical - Request for Lab/Test Order >> Jan 22, 2023  2:36 PM Clayton Bibles wrote: Reason for CRM: Regina Warren wants to come in to complete labs on January 2nd. She wants a CBC and Sugar test. Please message her on MyChart or call if needed at [phone]. Thanks   Called to make an appt. No answer lvm. KH

## 2023-02-12 ENCOUNTER — Encounter: Payer: Self-pay | Admitting: Nurse Practitioner

## 2023-02-12 DIAGNOSIS — M546 Pain in thoracic spine: Secondary | ICD-10-CM | POA: Diagnosis not present

## 2023-02-12 DIAGNOSIS — M9903 Segmental and somatic dysfunction of lumbar region: Secondary | ICD-10-CM | POA: Diagnosis not present

## 2023-02-12 DIAGNOSIS — M542 Cervicalgia: Secondary | ICD-10-CM | POA: Diagnosis not present

## 2023-02-12 DIAGNOSIS — M9905 Segmental and somatic dysfunction of pelvic region: Secondary | ICD-10-CM | POA: Diagnosis not present

## 2023-02-12 DIAGNOSIS — M545 Low back pain, unspecified: Secondary | ICD-10-CM | POA: Diagnosis not present

## 2023-02-12 DIAGNOSIS — M9902 Segmental and somatic dysfunction of thoracic region: Secondary | ICD-10-CM | POA: Diagnosis not present

## 2023-02-12 DIAGNOSIS — M62830 Muscle spasm of back: Secondary | ICD-10-CM | POA: Diagnosis not present

## 2023-02-12 DIAGNOSIS — M9901 Segmental and somatic dysfunction of cervical region: Secondary | ICD-10-CM | POA: Diagnosis not present

## 2023-02-19 ENCOUNTER — Encounter: Payer: Self-pay | Admitting: Nurse Practitioner

## 2023-02-19 ENCOUNTER — Ambulatory Visit (INDEPENDENT_AMBULATORY_CARE_PROVIDER_SITE_OTHER): Payer: Medicaid Other | Admitting: Nurse Practitioner

## 2023-02-19 VITALS — BP 127/91 | HR 90 | Temp 97.9°F | Wt 188.0 lb

## 2023-02-19 DIAGNOSIS — Z1329 Encounter for screening for other suspected endocrine disorder: Secondary | ICD-10-CM | POA: Diagnosis not present

## 2023-02-19 DIAGNOSIS — Z13220 Encounter for screening for lipoid disorders: Secondary | ICD-10-CM

## 2023-02-19 DIAGNOSIS — G43809 Other migraine, not intractable, without status migrainosus: Secondary | ICD-10-CM

## 2023-02-19 DIAGNOSIS — Z0000 Encounter for general adult medical examination without abnormal findings: Secondary | ICD-10-CM | POA: Diagnosis not present

## 2023-02-19 DIAGNOSIS — Z131 Encounter for screening for diabetes mellitus: Secondary | ICD-10-CM | POA: Diagnosis not present

## 2023-02-19 DIAGNOSIS — Z113 Encounter for screening for infections with a predominantly sexual mode of transmission: Secondary | ICD-10-CM | POA: Diagnosis not present

## 2023-02-19 DIAGNOSIS — Z1389 Encounter for screening for other disorder: Secondary | ICD-10-CM | POA: Diagnosis not present

## 2023-02-19 DIAGNOSIS — E559 Vitamin D deficiency, unspecified: Secondary | ICD-10-CM | POA: Diagnosis not present

## 2023-02-19 DIAGNOSIS — N945 Secondary dysmenorrhea: Secondary | ICD-10-CM | POA: Diagnosis not present

## 2023-02-19 DIAGNOSIS — Z01419 Encounter for gynecological examination (general) (routine) without abnormal findings: Secondary | ICD-10-CM | POA: Diagnosis not present

## 2023-02-19 LAB — POCT GLYCOSYLATED HEMOGLOBIN (HGB A1C): Hemoglobin A1C: 5.3 % (ref 4.0–5.6)

## 2023-02-19 NOTE — Progress Notes (Signed)
Subjective   Patient ID: Regina Warren, female    DOB: 05-04-1995, 28 y.o.   MRN: [id number]  Chief Complaint  Patient presents with   Annual Exam    Blood work needed also VitD   Hearing Problem    Right ear hearing loss when she lays on right side    Referring provider: Ivonne Andrew, NP  Regina Warren is a 28 y.o. female with Past Medical History: No date: Abdominal pain No date: Anxiety 03/2019: Discomfort of back 06/2018: Vitamin D deficiency   HPI  Patient presents today for physical.  She states that she does continue to have migraine headaches.  She was seen by neurology in 2018 and did have a CT scan.  She states that she would like a referral back to neurology.  She states that sometimes migraines last for 3 days. Denies f/c/s, n/v/d, hemoptysis, PND, leg swelling Denies chest pain or edema     Allergies  Allergen Reactions   Imitrex [Sumatriptan]     "made my brain feel like it was about to explode"    Immunization History  Administered Date(s) Administered   Influenza,inj,Quad PF,6+ Mos 01/14/2017, 01/06/2018, 10/05/2018, 10/04/2019    Tobacco History: Social History   Tobacco Use  Smoking Status Never  Smokeless Tobacco Never   Counseling given: Not Answered   Outpatient Encounter Medications as of 02/19/2023  Medication Sig   acetaminophen (TYLENOL 8 HOUR) 650 MG CR tablet    AMBULATORY NON FORMULARY MEDICATION Nitroglycerine ointment 0.125 %  Apply a pea sized amount internally three-four times daily. Dispense 30 GM 1 refill (Patient taking differently: Nitroglycerine ointment 0.125 %  Apply a pea sized amount internally three-four times daily as needed Dispense 30 GM 1 refill)   benzoyl peroxide-erythromycin (BENZAMYCIN) gel Apply 1 a small amount to affected area once a day   ibuprofen (ADVIL) 800 MG tablet TAKE 1 TABLET(800 MG) BY MOUTH THREE TIMES DAILY AS NEEDED   Vitamin D, Ergocalciferol, (DRISDOL) 1.25 MG  (50000 UNIT) CAPS capsule TAKE 1 CAPSULE BY MOUTH EVERY 7 DAYS FOR 8 DOSES   docusate sodium (COLACE) 100 MG capsule Take 1 capsule (100 mg total) by mouth 2 (two) times daily. (Patient not taking: Reported on 02/19/2023)   No facility-administered encounter medications on file as of 02/19/2023.    Review of Systems  Review of Systems  Constitutional: Negative.   HENT: Negative.    Cardiovascular: Negative.   Gastrointestinal: Negative.   Allergic/Immunologic: Negative.   Neurological: Negative.   Psychiatric/Behavioral: Negative.       Objective:   BP (!) 127/91   Pulse 90   Temp 97.9 F (36.6 C)   Wt 188 lb (85.3 kg)   SpO2 100%   BMI 33.30 kg/m   Wt Readings from Last 5 Encounters:  02/19/23 188 lb (85.3 kg)  06/26/22 187 lb (84.8 kg)  08/15/21 172 lb 6 oz (78.2 kg)  11/02/20 162 lb 0.8 oz (73.5 kg)  09/17/20 162 lb 0.8 oz (73.5 kg)     Physical Exam Vitals and nursing note reviewed.  Constitutional:      General: She is not in acute distress.    Appearance: She is well-developed.  Cardiovascular:     Rate and Rhythm: Normal rate and regular rhythm.  Pulmonary:     Effort: Pulmonary effort is normal.     Breath sounds: Normal breath sounds.  Neurological:     Mental Status: She is alert and oriented to person,  place, and time.       Assessment & Plan:   Routine adult health maintenance -     CBC -     Comprehensive metabolic panel -     TSH -     Lipid panel -     VITAMIN D 25 Hydroxy (Vit-D Deficiency, Fractures)  Lipid screening -     Lipid panel  Vitamin D deficiency -     VITAMIN D 25 Hydroxy (Vit-D Deficiency, Fractures)  Thyroid disorder screen -     TSH  Screening for STD (sexually transmitted disease) -     RPR+HIV+GC+CT Panel  Other migraine without status migrainosus, not intractable -     Ambulatory referral to Neurology  Diabetes mellitus screening -     POCT glycosylated hemoglobin (Hb A1C)     Return in about 1 year  (around 02/19/2024) for Physical.   Ivonne Andrew, NP 02/19/2023

## 2023-02-19 NOTE — Patient Instructions (Addendum)
1. Routine adult health maintenance (Primary)  - CBC - Comprehensive metabolic panel - TSH - Lipid Panel - Vitamin D, 25-hydroxy  2. Lipid screening  - Lipid Panel  3. Vitamin D deficiency  - Vitamin D, 25-hydroxy  4. Thyroid disorder screen  - TSH  5. Screening for STD (sexually transmitted disease)  - RPR+HIV+GC+CT Panel  Follow up:  Follow up in 1 year

## 2023-02-20 ENCOUNTER — Ambulatory Visit: Payer: Self-pay | Admitting: Nurse Practitioner

## 2023-02-20 DIAGNOSIS — M546 Pain in thoracic spine: Secondary | ICD-10-CM | POA: Diagnosis not present

## 2023-02-20 DIAGNOSIS — M9905 Segmental and somatic dysfunction of pelvic region: Secondary | ICD-10-CM | POA: Diagnosis not present

## 2023-02-20 DIAGNOSIS — M9903 Segmental and somatic dysfunction of lumbar region: Secondary | ICD-10-CM | POA: Diagnosis not present

## 2023-02-20 DIAGNOSIS — M9901 Segmental and somatic dysfunction of cervical region: Secondary | ICD-10-CM | POA: Diagnosis not present

## 2023-02-20 DIAGNOSIS — M9902 Segmental and somatic dysfunction of thoracic region: Secondary | ICD-10-CM | POA: Diagnosis not present

## 2023-02-20 DIAGNOSIS — M545 Low back pain, unspecified: Secondary | ICD-10-CM | POA: Diagnosis not present

## 2023-02-20 DIAGNOSIS — M542 Cervicalgia: Secondary | ICD-10-CM | POA: Diagnosis not present

## 2023-02-20 DIAGNOSIS — M62830 Muscle spasm of back: Secondary | ICD-10-CM | POA: Diagnosis not present

## 2023-02-20 LAB — COMPREHENSIVE METABOLIC PANEL
ALT: 19 [IU]/L (ref 0–32)
AST: 20 [IU]/L (ref 0–40)
Albumin: 4.3 g/dL (ref 4.0–5.0)
Alkaline Phosphatase: 76 [IU]/L (ref 44–121)
BUN/Creatinine Ratio: 10 (ref 9–23)
BUN: 8 mg/dL (ref 6–20)
Bilirubin Total: 0.4 mg/dL (ref 0.0–1.2)
CO2: 21 mmol/L (ref 20–29)
Calcium: 9.4 mg/dL (ref 8.7–10.2)
Chloride: 104 mmol/L (ref 96–106)
Creatinine, Ser: 0.79 mg/dL (ref 0.57–1.00)
Globulin, Total: 2.6 g/dL (ref 1.5–4.5)
Glucose: 86 mg/dL (ref 70–99)
Potassium: 4.2 mmol/L (ref 3.5–5.2)
Sodium: 139 mmol/L (ref 134–144)
Total Protein: 6.9 g/dL (ref 6.0–8.5)
eGFR: 104 mL/min/{1.73_m2} (ref >59)

## 2023-02-20 LAB — CBC
Hematocrit: 39.3 % (ref 34.0–46.6)
Hemoglobin: 13.3 g/dL (ref 11.1–15.9)
MCH: 31.8 pg (ref 26.6–33.0)
MCHC: 33.8 g/dL (ref 31.5–35.7)
MCV: 94 fL (ref 79–97)
Platelets: 308 10*3/uL (ref 150–450)
RBC: 4.18 x10E6/uL (ref 3.77–5.28)
RDW: 11.8 % (ref 11.7–15.4)
WBC: 5.8 10*3/uL (ref 3.4–10.8)

## 2023-02-20 LAB — VITAMIN D 25 HYDROXY (VIT D DEFICIENCY, FRACTURES): Vit D, 25-Hydroxy: 14 ng/mL — ABNORMAL LOW (ref 30.0–100.0)

## 2023-02-20 LAB — LIPID PANEL
Chol/HDL Ratio: 3.3 {ratio} (ref 0.0–4.4)
Cholesterol, Total: 163 mg/dL (ref 100–199)
HDL: 49 mg/dL (ref >39)
LDL Chol Calc (NIH): 104 mg/dL — ABNORMAL HIGH (ref 0–99)
Triglycerides: 47 mg/dL (ref 0–149)
VLDL Cholesterol Cal: 10 mg/dL (ref 5–40)

## 2023-02-20 LAB — TSH: TSH: 2.08 u[IU]/mL (ref 0.450–4.500)

## 2023-02-20 NOTE — Telephone Encounter (Signed)
Additional Notes: Pt reports receiving lab results today through MyChart and is concerned about her cholesterol. Pt's PCP note states "overall labs are normal", but pt states her cholesterol is high. Pt requesting follow-up from her PCP about her cholesterol and further clarification of results. This RN advised pt RN will forward the concern to the appropriate people for follow-up.   Copied from CRM [id number]. Topic: Clinical - Red Word Triage >> Feb 20, 2023 11:48 AM Phill Myron wrote: Red Word that prompted transfer to Nurse Triage:  patient upset with a e2c2 rep but had questions regarding her cholesterol Reason for Disposition  Health Information question, no triage required and triager able to answer question  Answer Assessment - Initial Assessment Questions 1. REASON FOR CALL or QUESTION: "What is your reason for calling today?" or "How can I best help you?" or "What question do you have that I can help answer?"     Pt concerned about recent lab work results she saw on MyChart, specifically her cholesterol. Dr. Tanda Rockers note states that "overall labs are normal", but pt states her cholesterol is high and would like Dr. Tanda Rockers to reach out for further clarification of results.  Protocols used: Information Only Call - No Triage-A-AH

## 2023-02-23 ENCOUNTER — Other Ambulatory Visit: Payer: Self-pay | Admitting: Nurse Practitioner

## 2023-02-23 DIAGNOSIS — E559 Vitamin D deficiency, unspecified: Secondary | ICD-10-CM

## 2023-02-23 LAB — RPR+HIV+GC+CT PANEL
HIV Screen 4th Generation wRfx: NONREACTIVE
RPR Ser Ql: NONREACTIVE

## 2023-02-23 MED ORDER — VITAMIN D (ERGOCALCIFEROL) 1.25 MG (50000 UNIT) PO CAPS: ORAL_CAPSULE | ORAL | 0 refills | Status: DC

## 2023-02-23 NOTE — Telephone Encounter (Signed)
Pt was called and appt was made [facility]

## 2023-02-23 NOTE — Telephone Encounter (Signed)
Pt was made an appt.KH

## 2023-02-25 DIAGNOSIS — R000 Tachycardia, unspecified: Secondary | ICD-10-CM | POA: Diagnosis not present

## 2023-02-25 DIAGNOSIS — J101 Influenza due to other identified influenza virus with other respiratory manifestations: Secondary | ICD-10-CM | POA: Diagnosis not present

## 2023-02-25 DIAGNOSIS — R079 Chest pain, unspecified: Secondary | ICD-10-CM | POA: Diagnosis not present

## 2023-02-25 DIAGNOSIS — Z1152 Encounter for screening for COVID-19: Secondary | ICD-10-CM | POA: Diagnosis not present

## 2023-02-26 ENCOUNTER — Ambulatory Visit: Payer: Self-pay | Admitting: Nurse Practitioner

## 2023-02-26 NOTE — Telephone Encounter (Signed)
Copied from CRM [id number]. Topic: General - Other >> Feb 26, 2023 10:01 AM Twila L wrote: Reason for GEX:[id number] was in ER last night and diagnosed with Flu A. Chest flutters   Chief Complaint: Influenza  Symptoms: Cough, chills, sinus congestion/drainage, headache, palpitations  Frequency: Constant  Pertinent Negatives: Patient denies chest pain or shortness of breath  Disposition: [] ED /[] Urgent Care (no appt availability in office) / [x] Appointment(In office/virtual)/ []  [facility]/ [] Home Care/ [] Refused Recommended Disposition /[] [facility]/ []  Follow-up with PCP Additional Notes: Patient seen in the ED yesterday and was diagnosed with Influenza-A and palpitations. She states that she was prescribed Tamiflu that she started taking last night. Patient wanted a follow up appointment to discuss other treatment options and her heart rate. She denies any chest pain or shortness of breath at this time. Appointment made for the patient and patient instructed to call back for new or worsening symptoms. She verbalized understanding of this plan.      Reason for Disposition  [1] Taking antiviral medication AND [2] has question about the medication that triager can't answer  Answer Assessment - Initial Assessment Questions 1. DIAGNOSIS CONFIRMATION: "When was the influenza diagnosed?" "By whom?" "Did you get a test for it?"     02/25/23 at the Emergency Department 2. MEDICINES: "Were you prescribed any medications for the influenza?"  (e.g., zanamivir [Relenza], oseltamavir [Tamiflu]).      Tamiflu 3. ONSET of SYMPTOMS: "When did your symptoms start?"     2 days ago 4. SYMPTOMS: "What symptoms are you most concerned about?" (e.g., runny nose, stuffy nose, sore throat, cough, breathing difficulty, fever)     Cough, chills, sinus congestion/drainage, headache, palpitations  5. COUGH: "How bad is the cough?"     Intermittent  6. FEVER: "Do you have a fever?" If Yes, ask:  "What is your temperature, how was it measured, and when did it start?"     99 F this morning  7. RESPIRATORY DISTRESS: "Are you having any trouble breathing?" If Yes, ask: "Describe your breathing."      No 8. FLU VACCINE: "Did you receive a flu shot this year?" (e.g., seasonal influenza, H1N1)     No 9. PREGNANCY: "Is there any chance you are pregnant?" "When was your last menstrual period?"     No 10. HIGH RISK for COMPLICATIONS: "Do you have any heart or lung problems? Do you have a weakened immune system?" (e.g., CHF, COPD, asthma, HIV positive, chemotherapy, renal failure, diabetes mellitus, sickle cell anemia)       Lung surgery at 9 but no issues since  Protocols used: Influenza (Flu) Follow-up Call-A-AH

## 2023-02-27 ENCOUNTER — Encounter: Payer: Self-pay | Admitting: Nurse Practitioner

## 2023-02-27 ENCOUNTER — Telehealth (INDEPENDENT_AMBULATORY_CARE_PROVIDER_SITE_OTHER): Payer: Medicaid Other | Admitting: Nurse Practitioner

## 2023-02-27 VITALS — Ht 65.0 in | Wt 188.0 lb

## 2023-02-27 DIAGNOSIS — J101 Influenza due to other identified influenza virus with other respiratory manifestations: Secondary | ICD-10-CM | POA: Diagnosis not present

## 2023-02-27 DIAGNOSIS — R000 Tachycardia, unspecified: Secondary | ICD-10-CM

## 2023-02-27 DIAGNOSIS — E559 Vitamin D deficiency, unspecified: Secondary | ICD-10-CM

## 2023-02-27 MED ORDER — OSELTAMIVIR PHOSPHATE 75 MG PO CAPS: 75.0000 mg | ORAL_CAPSULE | Freq: Two times a day (BID) | ORAL | 0 refills | Status: DC

## 2023-02-27 MED ORDER — VITAMIN D (ERGOCALCIFEROL) 1.25 MG (50000 UNIT) PO CAPS: ORAL_CAPSULE | ORAL | 0 refills | Status: DC

## 2023-02-27 MED ORDER — BENZONATATE 100 MG PO CAPS: 100.0000 mg | ORAL_CAPSULE | Freq: Three times a day (TID) | ORAL | 0 refills | Status: DC | PRN

## 2023-02-27 MED ORDER — NOREL AD 4-10-325 MG PO TABS: 1.0000 | ORAL_TABLET | Freq: Four times a day (QID) | ORAL | 0 refills | Status: DC | PRN

## 2023-02-27 NOTE — Assessment & Plan Note (Signed)
lood Pressure 155/85 02/25/2023 5:47 PM EST    Pulse 115 02/25/2023 5:47 PM EST    Temperature 37.3 C (99.2 F) 02/25/2023 5:47 PM EST    Respiratory Rate 20 02/25/2023 5:47 PM EST    Oxygen Saturation 100% 02/25/2023 5:47 PM EST    Inhaled Oxygen Concentration - -    Weight 85.3 kg (188 lb) 02/25/2023 2:25 PM EST    Height 165.1 cm (5\' 5" ) 02/25/2023 2:25 PM EST    Body Mass Index 31.28    Vital signs above weight collected at the hospital Heart rate today was less than 100 Her heart rate could have been elevated due to her acute illness while at the hospital.

## 2023-02-27 NOTE — Assessment & Plan Note (Addendum)
Continue tamiflu 75mg  BID  - benzonatate (TESSALON PERLES) 100 MG capsule; Take 1 capsule (100 mg total) by mouth 3 (three) times daily as needed for cough.  Dispense: 20 capsule; Refill: 0 - Chlorphen-PE-Acetaminophen (NOREL AD) 4-10-325 MG TABS; Take 1 tablet by mouth every 6 (six) hours as needed for body aches, Headaches  Dispense: 20 tablet; Refill: 0 Importance of hydration and rest dicussed.  Encouraged to remain on precaution for 5 days from when tested positive.  Patient told not to take ibuprofen with aleeve.

## 2023-02-27 NOTE — Progress Notes (Signed)
Virtual Visit via Telephone Note  I connected with Regina Warren @ on 02/27/23 at  8:40 AM EST by telephone and verified that I am speaking with the correct person using two identifiers. I spent 8 minutes on this video encounter.   Location: Patient: home Provider: office   I discussed the limitations, risks, security and privacy concerns of performing an evaluation and management service by telephone and the availability of in person appointments. I also discussed with the patient that there may be a patient responsible charge related to this service. The patient expressed understanding and agreed to proceed.   History of Present Illness: [name] has a past medical history of Abdominal pain, Anxiety, Discomfort of back (03/2019), and Vitamin D deficiency (06/2018). Patient presents with c/o sore throat, chills, body aches,cough that makes her chest hurts.  she was diagnosed with influenza A at [facility] on 02/25/2023. She has been taking tamiflu 75mg  BID. Taking ibuprofen and tylenol as needed for HA but both medications are not helping. Had fever but her fever went down, also had elevated HR, HR today was less than 100 when she check her HR during this visit.    Observations/Objective: Patient alert and oriented.  No sign of distress noted   Assessment and Plan: Vitamin D deficiency  - Vitamin D, Ergocalciferol, (DRISDOL) 1.25 MG (50000 UNIT) CAPS capsule; TAKE 1 CAPSULE BY MOUTH EVERY 7 DAYS FOR 8 DOSES  Dispense: 8 capsule; Refill: 0   Influenza A Continue tamiflu 75mg  BID  - benzonatate (TESSALON PERLES) 100 MG capsule; Take 1 capsule (100 mg total) by mouth 3 (three) times daily as needed for cough.  Dispense: 20 capsule; Refill: 0 - Chlorphen-PE-Acetaminophen (NOREL AD) 4-10-325 MG TABS; Take 1 tablet by mouth every 6 (six) hours as needed for body aches, Headaches  Dispense: 20 tablet; Refill: 0 Importance of hydration and rest dicussed.  Encouraged to remain on precaution  for 5 days from when tested positive.  Patient told not to take ibuprofen with aleeve.      Sinus tachycardia lood Pressure 155/85 02/25/2023 5:47 PM EST    Pulse 115 02/25/2023 5:47 PM EST    Temperature 37.3 C (99.2 F) 02/25/2023 5:47 PM EST    Respiratory Rate 20 02/25/2023 5:47 PM EST    Oxygen Saturation 100% 02/25/2023 5:47 PM EST    Inhaled Oxygen Concentration - -    Weight 85.3 kg (188 lb) 02/25/2023 2:25 PM EST    Height 165.1 cm (5\' 5" ) 02/25/2023 2:25 PM EST    Body Mass Index 31.28    Vital signs above weight collected at the hospital Heart rate today was less than 100 Her heart rate could have been elevated due to her acute illness while at the hospital.    Follow Up Instructions:    I discussed the assessment and treatment plan with the patient. The patient was provided an opportunity to ask questions and all were answered. The patient agreed with the plan and demonstrated an understanding of the instructions.   The patient was advised to call back or seek an in-person evaluation if the symptoms worsen or if the condition fails to improve as anticipated.

## 2023-02-27 NOTE — Patient Instructions (Addendum)
1. Influenza A (Primary)  - Chlorphen-PE-Acetaminophen (NOREL AD) 4-10-325 MG TABS; Take 1 tablet by mouth every 6 (six) hours as needed.  Dispense: 20 tablet; Refill: 0 - benzonatate (TESSALON PERLES) 100 MG capsule; Take 1 capsule (100 mg total) by mouth 3 (three) times daily as needed for cough.  Dispense: 20 capsule; Refill: 0  2. Vitamin D deficiency  - Vitamin D, Ergocalciferol, (DRISDOL) 1.25 MG (50000 UNIT) CAPS capsule; TAKE 1 CAPSULE BY MOUTH EVERY 7 DAYS FOR 8 DOSES  Dispense: 8 capsule; Refill: 0     It is important that you exercise regularly at least 30 minutes 5 times a week as tolerated  Think about what you will eat, plan ahead. Choose " clean, green, fresh or frozen" over canned, processed or packaged foods which are more sugary, salty and fatty. 70 to 75% of food eaten should be vegetables and fruit. Three meals at set times with snacks allowed between meals, but they must be fruit or vegetables. Aim to eat over a 12 hour period , example 7 am to 7 pm, and STOP after  your last meal of the day. Drink water,generally about 64 ounces per day, no other drink is as healthy. Fruit juice is best enjoyed in a healthy way, by EATING the fruit.  Thanks for choosing [facility] we consider it a privelige to serve you.

## 2023-02-27 NOTE — Assessment & Plan Note (Signed)
-   Vitamin D, Ergocalciferol, (DRISDOL) 1.25 MG (50000 UNIT) CAPS capsule; TAKE 1 CAPSULE BY MOUTH EVERY 7 DAYS FOR 8 DOSES  Dispense: 8 capsule; Refill: 0

## 2023-03-12 ENCOUNTER — Encounter: Payer: Self-pay | Admitting: Nurse Practitioner

## 2023-03-26 DIAGNOSIS — M9901 Segmental and somatic dysfunction of cervical region: Secondary | ICD-10-CM | POA: Diagnosis not present

## 2023-03-26 DIAGNOSIS — M542 Cervicalgia: Secondary | ICD-10-CM | POA: Diagnosis not present

## 2023-03-26 DIAGNOSIS — M9902 Segmental and somatic dysfunction of thoracic region: Secondary | ICD-10-CM | POA: Diagnosis not present

## 2023-03-26 DIAGNOSIS — M546 Pain in thoracic spine: Secondary | ICD-10-CM | POA: Diagnosis not present

## 2023-03-26 DIAGNOSIS — M9903 Segmental and somatic dysfunction of lumbar region: Secondary | ICD-10-CM | POA: Diagnosis not present

## 2023-03-26 DIAGNOSIS — M545 Low back pain, unspecified: Secondary | ICD-10-CM | POA: Diagnosis not present

## 2023-03-26 DIAGNOSIS — M9905 Segmental and somatic dysfunction of pelvic region: Secondary | ICD-10-CM | POA: Diagnosis not present

## 2023-03-26 DIAGNOSIS — M62830 Muscle spasm of back: Secondary | ICD-10-CM | POA: Diagnosis not present

## 2023-04-10 DIAGNOSIS — M9911 Subluxation complex (vertebral) of cervical region: Secondary | ICD-10-CM | POA: Diagnosis not present

## 2023-04-10 DIAGNOSIS — M546 Pain in thoracic spine: Secondary | ICD-10-CM | POA: Diagnosis not present

## 2023-04-10 DIAGNOSIS — M9913 Subluxation complex (vertebral) of lumbar region: Secondary | ICD-10-CM | POA: Diagnosis not present

## 2023-04-10 DIAGNOSIS — M62830 Muscle spasm of back: Secondary | ICD-10-CM | POA: Diagnosis not present

## 2023-04-10 DIAGNOSIS — M9915 Subluxation complex (vertebral) of pelvic region: Secondary | ICD-10-CM | POA: Diagnosis not present

## 2023-04-10 DIAGNOSIS — M9912 Subluxation complex (vertebral) of thoracic region: Secondary | ICD-10-CM | POA: Diagnosis not present

## 2023-04-10 DIAGNOSIS — M542 Cervicalgia: Secondary | ICD-10-CM | POA: Diagnosis not present

## 2023-04-10 DIAGNOSIS — M545 Low back pain, unspecified: Secondary | ICD-10-CM | POA: Diagnosis not present

## 2023-04-20 ENCOUNTER — Other Ambulatory Visit: Payer: Self-pay | Admitting: Nurse Practitioner

## 2023-04-20 DIAGNOSIS — E559 Vitamin D deficiency, unspecified: Secondary | ICD-10-CM

## 2023-05-22 DIAGNOSIS — M546 Pain in thoracic spine: Secondary | ICD-10-CM | POA: Diagnosis not present

## 2023-05-22 DIAGNOSIS — M9915 Subluxation complex (vertebral) of pelvic region: Secondary | ICD-10-CM | POA: Diagnosis not present

## 2023-05-22 DIAGNOSIS — M545 Low back pain, unspecified: Secondary | ICD-10-CM | POA: Diagnosis not present

## 2023-05-22 DIAGNOSIS — M62830 Muscle spasm of back: Secondary | ICD-10-CM | POA: Diagnosis not present

## 2023-05-22 DIAGNOSIS — M9913 Subluxation complex (vertebral) of lumbar region: Secondary | ICD-10-CM | POA: Diagnosis not present

## 2023-05-22 DIAGNOSIS — M9912 Subluxation complex (vertebral) of thoracic region: Secondary | ICD-10-CM | POA: Diagnosis not present

## 2023-05-22 DIAGNOSIS — M9911 Subluxation complex (vertebral) of cervical region: Secondary | ICD-10-CM | POA: Diagnosis not present

## 2023-05-22 DIAGNOSIS — M542 Cervicalgia: Secondary | ICD-10-CM | POA: Diagnosis not present

## 2023-06-01 ENCOUNTER — Encounter: Payer: Self-pay | Admitting: Neurology

## 2023-06-01 ENCOUNTER — Other Ambulatory Visit: Payer: Self-pay

## 2023-06-01 ENCOUNTER — Ambulatory Visit (INDEPENDENT_AMBULATORY_CARE_PROVIDER_SITE_OTHER): Payer: Self-pay | Admitting: Neurology

## 2023-06-01 ENCOUNTER — Telehealth: Payer: Self-pay | Admitting: Neurology

## 2023-06-01 VITALS — BP 122/90 | HR 81 | Ht 65.0 in | Wt 188.6 lb

## 2023-06-01 DIAGNOSIS — H539 Unspecified visual disturbance: Secondary | ICD-10-CM | POA: Diagnosis not present

## 2023-06-01 DIAGNOSIS — G43809 Other migraine, not intractable, without status migrainosus: Secondary | ICD-10-CM

## 2023-06-01 DIAGNOSIS — Z1329 Encounter for screening for other suspected endocrine disorder: Secondary | ICD-10-CM

## 2023-06-01 DIAGNOSIS — G43709 Chronic migraine without aura, not intractable, without status migrainosus: Secondary | ICD-10-CM | POA: Diagnosis not present

## 2023-06-01 DIAGNOSIS — N926 Irregular menstruation, unspecified: Secondary | ICD-10-CM

## 2023-06-01 DIAGNOSIS — R51 Headache with orthostatic component, not elsewhere classified: Secondary | ICD-10-CM | POA: Diagnosis not present

## 2023-06-01 DIAGNOSIS — R519 Headache, unspecified: Secondary | ICD-10-CM

## 2023-06-01 DIAGNOSIS — G4489 Other headache syndrome: Secondary | ICD-10-CM

## 2023-06-01 MED ORDER — ELETRIPTAN HYDROBROMIDE 40 MG PO TABS: 40.0000 mg | ORAL_TABLET | ORAL | 11 refills | Status: AC | PRN

## 2023-06-01 MED ORDER — PROPRANOLOL HCL 10 MG PO TABS: 10.0000 mg | ORAL_TABLET | Freq: Two times a day (BID) | ORAL | 6 refills | Status: AC

## 2023-06-01 NOTE — Progress Notes (Unsigned)
 [facility]    Provider:  Dr Tresia Fruit Requesting Provider: Jerrlyn Morel, NP Primary Care Provider:  Jerrlyn Morel, NP  CC:  migraines  HPI:  Regina Warren is a 28 y.o. female here as requested by Jerrlyn Morel, NP for migraines. has Elevated BP without diagnosis of hypertension; Pelvic pain; Vaginal discharge; Right lower quadrant abdominal pain; Urinary tract infection without hematuria; Intractable headache; Discomfort of back; Runny nose; Screening for STD (sexually transmitted disease); Vitamin D  deficiency; Influenza A; and Sinus tachycardia on their problem list.  She teaches English. She has had migraines since HS and more frequent since older 3-4 times a week from 4-24 hours noticed more around her menses and when they come on she wants to go to sleep, moderate to severe pain, they are worsening becoming more severe and more frequent, pulsating/throbbing, they vary in location behind the eyes, unilateral on either side, no vision changes, it can be in the morning apon waking and those can the worst worse supine, she gets sound sensitivity, a dark quiet room helps and sleep, she can wake up with it and ibuprofen  doesn't work, she tries to take alleve and essential oils. She has nausea no vomiting. Exercise helps. No dizziness. She now has almost daily headaches which is much worse than prior and 12 a month are moderate to severe migraines. Not using birth control and trying to get pregnant, would not use Topiramate. No other focal neurologic deficits, associated symptoms, inciting events or modifiable factors.  Reviewed notes, labs and imaging from outside physicians, which showed:  Medications tried that can be used in migraine/headache management greater than 3 months include: Lifestyle modification, headache diaries, better sleep hygiene, exercise, management of migraine triggers: imitrex , Not using birth control and trying to get pregnant, would not  use Topiramate, see discussion in assessment and plan.Imitrex  without swelling or shortness of breath just made her feel bad can try a different triptan.   CT head 2018:  CLINICAL DATA:  Persistent dull headache since 2009, worse with stress.   EXAM: CT HEAD WITHOUT CONTRAST   TECHNIQUE: Contiguous axial images were obtained from the base of the skull through the vertex without intravenous contrast.   COMPARISON:  None.   FINDINGS: BRAIN: No intraparenchymal hemorrhage, mass effect nor midline shift. The ventricles and sulci are normal. No acute large vascular territory infarcts. No abnormal extra-axial fluid collections. Basal cisterns are patent.   VASCULAR: Unremarkable.   SKULL/SOFT TISSUES: No skull fracture. No significant soft tissue swelling.   ORBITS/SINUSES: The included ocular globes and orbital contents are normal.The mastoid aircells and included paranasal sinuses are well-aerated.   OTHER: None.   IMPRESSION: Normal noncontrast CT HEAD.  02/19/2023: TSH/CBC/CMP normal  Review of Systems: Patient complains of symptoms per HPI as well as the following symptoms none. Pertinent negatives and positives per HPI. All others negative.   Social History   Socioeconomic History   Marital status: Single    Spouse name: Not on file   Number of children: 1   Years of education: Not on file   Highest education level: Not on file  Occupational History   Occupation: case worker  Tobacco Use   Smoking status: Never   Smokeless tobacco: Never  Vaping Use   Vaping status: Never Used  Substance and Sexual Activity   Alcohol use: Not Currently    Comment: occasionally   Drug use: Never   Sexual activity: Yes    Birth control/protection: None, Condom  Other Topics Concern   Not on file  Social History Narrative   ** Merged History Encounter **       Social Drivers of Health   Financial Resource Strain: Not on file  Food Insecurity: No Food Insecurity  (02/19/2023)   Hunger Vital Sign    Worried About Running Out of Food in the Last Year: Never true    Ran Out of Food in the Last Year: Never true  Transportation Needs: No Transportation Needs (02/19/2023)   PRAPARE - Administrator, Civil Service (Medical): No    Lack of Transportation (Non-Medical): No  Physical Activity: Not on file  Stress: Not on file  Social Connections: Not on file  Intimate Partner Violence: Not At Risk (02/19/2023)   Humiliation, Afraid, Rape, and Kick questionnaire    Fear of Current or Ex-Partner: No    Emotionally Abused: No    Physically Abused: No    Sexually Abused: No    Family History  Problem Relation Age of Onset   Rectal cancer Father    Colon cancer Maternal Grandmother    Breast cancer Paternal Grandmother    Mental illness Neg Hx     Past Medical History:  Diagnosis Date   Abdominal pain    Anxiety    Discomfort of back 03/2019   Vitamin D  deficiency 06/2018    Patient Active Problem List   Diagnosis Date Noted   Influenza A 02/27/2023   Sinus tachycardia 02/27/2023   Vitamin D  deficiency 06/26/2022   Screening for STD (sexually transmitted disease) 10/23/2021   Runny nose 08/12/2019   Discomfort of back 04/02/2019   Intractable headache 07/25/2018   Right lower quadrant abdominal pain 05/21/2018   Urinary tract infection without hematuria 05/21/2018   Pelvic pain 03/30/2018   Vaginal discharge 03/30/2018   Elevated BP without diagnosis of hypertension 03/14/2014    Past Surgical History:  Procedure Laterality Date   lung puncture      Current Outpatient Medications  Medication Sig Dispense Refill   benzoyl peroxide-erythromycin (BENZAMYCIN) gel Apply 1 a small amount to affected area once a day     eletriptan (RELPAX) 40 MG tablet Take 1 tablet (40 mg total) by mouth as needed for migraine or headache. May repeat in 2 hours if headache persists or recurs. 9 tablet 11   ibuprofen  (ADVIL ) 800 MG tablet TAKE 1  TABLET(800 MG) BY MOUTH THREE TIMES DAILY AS NEEDED 30 tablet 0   propranolol (INDERAL) 10 MG tablet Take 1 tablet (10 mg total) by mouth 2 (two) times daily. 60 tablet 6   tretinoin (RETIN-A) 0.1 % cream Apply topically at bedtime.     Vitamin D , Ergocalciferol , (DRISDOL ) 1.25 MG (50000 UNIT) CAPS capsule TAKE 1 CAPSULE BY MOUTH EVERY 7 DAYS FOR 8 DOSES 8 capsule 0   No current facility-administered medications for this visit.    Allergies as of 06/01/2023 - Review Complete 06/01/2023  Allergen Reaction Noted   Imitrex  [sumatriptan ]  03/17/2017    Vitals: BP (!) 122/90 (BP Location: Right Arm, Patient Position: Sitting, Cuff Size: Normal) Comment: manual recheck. pt will check again at home. Comment (BP Location): pt requested R arm  Pulse 81   Ht 5\' 5"  (1.651 m)   Wt 188 lb 9.6 oz (85.5 kg)   BMI 31.38 kg/m  Last Weight:  Wt Readings from Last 1 Encounters:  06/01/23 188 lb 9.6 oz (85.5 kg)   Last Height:   Ht Readings from Last  1 Encounters:  06/01/23 5\' 5"  (1.651 m)     Physical exam: Exam: Gen: NAD, conversant, well nourised, well groomed                     CV: RRR, no MRG. No Carotid Bruits. No peripheral edema, warm, nontender Eyes: Conjunctivae clear without exudates or hemorrhage  Neuro: Detailed Neurologic Exam  Speech:    Speech is normal; fluent and spontaneous with normal comprehension.  Cognition:    The patient is oriented to person, place, and time;     recent and remote memory intact;     language fluent;     normal attention, concentration,     fund of knowledge Cranial Nerves:    The pupils are equal, round, and reactive to light. The fundi are normal and spontaneous venous pulsations are present. Visual fields are full to finger confrontation. Extraocular movements are intact. Trigeminal sensation is intact and the muscles of mastication are normal. The face is symmetric. The palate elevates in the midline. Hearing intact. Voice is normal. Shoulder  shrug is normal. The tongue has normal motion without fasciculations.   Coordination: nml  Gait: nml  Motor Observation:    No asymmetry, no atrophy, and no involuntary movements noted. Tone:    Normal muscle tone.    Posture:    Posture is normal. normal erect    Strength:    Strength is V/V in the upper and lower limbs.      Sensation: intact to LT     Reflex Exam:  DTR's:    Deep tendon reflexes in the upper and lower extremities are normal bilaterally.   Toes:    The toes are downgoing bilaterally.   Clonus:    Clonus is absent.    Assessment/Plan:  Patient with migraines, not on birth control and considering pregnancy. Discussed there are no entirely safe medications in pregnancy and discussed options.  MRI of the brain w/wo contrast [facility] (currently not pregnant advised not to get pregnant until after the contrast for the MRI): MRI brain due to concerning symptoms of morning headaches, positional headaches,vision changes, worsening headaches  to look for space occupying mass, chiari or intracranial hypertension (pseudotumor), strokes, malignancies, vasculidities, demyelination(multiple sclerosis) or other  Propranolol 10mg  twice daily prevention twice a day daily and then can increase as tolerated. Next medications would be Nortriptyline. Nothing is entirely safe in pregnancy but there are medications we believe compatible, always discuss with OB/GYN. Provided information from [email or web address]  As needed/acute: Eletriptan: Please take one tablet at the onset of your headache. If it does not improve the symptoms please take one additional tablet in >= 2 hours. Do not take more then 2 tablets in 24hrs.     There are limited treatments in pregnancy and all have risks. Tylenol is generally accepted as safe in pregnancy, In my opinion Botox for migraines is not only very effective for migraines but compatible with pregnancy (of course nothing is safe and I did discuss with  patient). I also think there is a lot of literature on the compatibility of triptans in pregnancy.  Today we reviewed medication management in patients with migraine and pregnancy.  Unfortunately there are very few category B medications, in order of safety labetalol, propranolol, nortriptyline may be the safest  Namenda and Periactin both not with great efficacy but in the safer category. We discussed that luckily 80% of women approximately may have less migraines in her second and third trimesters.  We discussed that no medication is safe as far as teratogenicity and side effects however there are some we believe are "compatible" with migraines as above.  Women with migraines who are pregnant may have an increased risk of preeclampsia, and eclampsia, intracranial idiopathic hypertension, subarachnoid hemorrhage, pituitary apoplexy, cerebral venous thrombosis, reversible cerebral vasoconstriction syndrome, preterm labor, gestational hypertension.  Discussed the risks and benefits of treatment.     Other interventions to try and decreased the use of medication for headache/migraine include:  Cool Compress. Lie down and place a cool compress on your head.  Avoid headache triggers. If certain foods or odors seem to have triggered your migraines in the past, avoid them. A headache diary might help you identify triggers.  Include physical activity in your daily routine. Try a daily walk or other moderate aerobic exercise.  Manage stress. Find healthy ways to cope with the stressors, such as delegating tasks on your to-do list.  Practice relaxation techniques. Try deep breathing, yoga, massage and visualization.  Eat regularly. Eating regularly scheduled meals and maintaining a healthy diet might help prevent headaches. Also, drink plenty of fluids.  Follow a regular sleep schedule. Sleep deprivation might contribute to headaches  Consider biofeedback. With this mind-body technique, you learn to control  certain bodily functions -- such as muscle tension, heart rate and blood pressure -- to prevent headaches or reduce headache pain.   Headaches during pregnancy are common. However, if you develop a severe headache or a headache that doesn't go away, call your health care provider. Severe headaches can be a sign of a pregnancy complication      Orders Placed This Encounter  Procedures   MR BRAIN W WO CONTRAST   Meds ordered this encounter  Medications   propranolol (INDERAL) 10 MG tablet    Sig: Take 1 tablet (10 mg total) by mouth 2 (two) times daily.    Dispense:  60 tablet    Refill:  6   eletriptan (RELPAX) 40 MG tablet    Sig: Take 1 tablet (40 mg total) by mouth as needed for migraine or headache. May repeat in 2 hours if headache persists or recurs.    Dispense:  9 tablet    Refill:  11    Cc: Jerrlyn Morel, NP,  Jerrlyn Morel, NP  Aldona Amel, MD  [facility] [address] [place], [place] [address]  Phone [phone] Fax [phone]

## 2023-06-01 NOTE — Patient Instructions (Addendum)
 MRI of the brain w/wo contrast [place] Propranolol 10mg  twice daily prevention twice a day daily and then can increase as tolerated. Next medications would be Nortriptyline. Nothing is entirely safe in pregnancy but there are medications we believe compatible, always discuss with OB/GYN As needed/acute: Eletriptan: Please take one tablet at the onset of your headache. If it does not improve the symptoms please take one additional tablet in >= 2 hours. Do not take more then 2 tablets in 24hrs.   Rizatriptan Disintegrating Tablets What is this medication? RIZATRIPTAN (rye za TRIP tan) treats migraines. It works by blocking pain signals and narrowing blood vessels in the brain. It belongs to a group of medications called triptans. It is not used to prevent migraines. This medicine may be used for other purposes; ask your health care provider or pharmacist if you have questions. COMMON BRAND NAME(S): Maxalt-MLT What should I tell my care team before I take this medication? They need to know if you have any of these conditions: Circulation problems in fingers and toes Diabetes Heart disease High blood pressure High cholesterol History of irregular heartbeat History of stroke Stomach or intestine problems Tobacco use An unusual or allergic reaction to rizatriptan, other medications, foods, dyes, or preservatives Pregnant or trying to get pregnant Breast-feeding How should I use this medication? Take this medication by mouth. Take it as directed on the prescription label. You do not need water to take this medication. Leave the tablet in the sealed pack until you are ready to take it. With dry hands, open the pack and gently remove the tablet. If the tablet breaks or crumbles, throw it away. Use a new tablet. Place the tablet on the tongue and allow it to dissolve. Then, swallow it. Do not cut, crush, or chew this medication. Do not use it more often than directed. Talk to your care team about the  use of this medication in children. While it may be prescribed for children as young as 6 years for selected conditions, precautions do apply. Overdosage: If you think you have taken too much of this medicine contact a poison control center or emergency room at once. NOTE: This medicine is only for you. Do not share this medicine with others. What if I miss a dose? This does not apply. This medication is not for regular use. What may interact with this medication? Do not take this medication with any of the following: Ergot alkaloids, such as dihydroergotamine, ergotamine MAOIs, such as Marplan, Nardil, Parnate Other medications for migraine headache, such as almotriptan, eletriptan, frovatriptan, naratriptan, sumatriptan , zolmitriptan This medication may also interact with the following: Certain medications for depression, anxiety, or other mental health conditions Propranolol This list may not describe all possible interactions. Give your health care provider a list of all the medicines, herbs, non-prescription drugs, or dietary supplements you use. Also tell them if you smoke, drink alcohol, or use illegal drugs. Some items may interact with your medicine. What should I watch for while using this medication? Visit your care team for regular checks on your progress. Tell your care team if your symptoms do not start to get better or if they get worse. This medication may affect your coordination, reaction time, or judgment. Do not drive or operate machinery until you know how this medication affects you. Sit up or stand slowly to reduce the risk of dizzy or fainting spells. If you take migraine medications for 10 or more days a month, your migraines may get worse. Keep  a diary of headache days and medication use. Contact your care team if your migraine attacks occur more frequently. What side effects may I notice from receiving this medication? Side effects that you should report to your care  team as soon as possible: Allergic reactions--skin rash, itching, hives, swelling of the face, lips, tongue, or throat Burning, pain, tingling, or color changes in the hands, arms, legs, or feet Heart attack--pain or tightness in the chest, shoulders, arms, or jaw, nausea, shortness of breath, cold or clammy skin, feeling faint or lightheaded Heart rhythm changes--fast or irregular heartbeat, dizziness, feeling faint or lightheaded, chest pain, trouble breathing Increase in blood pressure Irritability, confusion, fast or irregular heartbeat, muscle stiffness, twitching muscles, sweating, high fever, seizure, chills, vomiting, diarrhea, which may be signs of serotonin syndrome Raynaud syndrome--cool, numb, or painful fingers or toes that may change color from pale, to blue, to red Seizures Stroke--sudden numbness or weakness of the face, arm, or leg, trouble speaking, confusion, trouble walking, loss of balance or coordination, dizziness, severe headache, change in vision Sudden or severe stomach pain, bloody diarrhea, fever, nausea, vomiting Vision loss Side effects that usually do not require medical attention (report to your care team if they continue or are bothersome): Dizziness Unusual weakness or fatigue This list may not describe all possible side effects. Call your doctor for medical advice about side effects. You may report side effects to FDA at 1-800-FDA-1088. Where should I keep my medication? Keep out of the reach of children and pets. Store at room temperature between 15 and 30 degrees C (59 and 86 degrees F). Protect from light and moisture. Get rid of any unused medication after the expiration date. To get rid of medications that are no longer needed or have expired: Take the medication to a medication take-back program. Check with your pharmacy or law enforcement to find a location. If you cannot return the medication, check the label or package insert to see if the medication  should be thrown out in the garbage or flushed down the toilet. If you are not sure, ask your care team. If it is safe to put it in the trash, empty the medication out of the container. Mix the medication with cat litter, dirt, coffee grounds, or other unwanted substance. Seal the mixture in a bag or container. Put it in the trash. NOTE: This sheet is a summary. It may not cover all possible information. If you have questions about this medicine, talk to your doctor, pharmacist, or health care provider.  2024 Elsevier/Gold Standard (2021-05-16 00:00:00)  Propranolol Tablets What is this medication? PROPRANOLOL (proe PRAN oh lole) treats many conditions such as high blood pressure, tremors, and a type of arrhythmia known as AFib (atrial fibrillation). It works by lowering your blood pressure and heart rate, making it easier for your heart to pump blood to the rest of your body. It may be used to prevent migraine headaches. It works by relaxing the blood vessels in the brain that cause migraines. It belongs to a group of medications called beta blockers. This medicine may be used for other purposes; ask your health care provider or pharmacist if you have questions. COMMON BRAND NAME(S): Inderal What should I tell my care team before I take this medication? They need to know if you have any of these conditions: Diabetes Having surgery Heart or blood vessel conditions, such as slow heartbeat, heart failure, heart block Kidney disease Liver disease Lung or breathing disease, such as asthma or  COPD Myasthenia gravis Pheochromocytoma Thyroid  disease An unusual or allergic reaction to propranolol, other medications, foods, dyes, or preservatives Pregnant or trying to get pregnant Breastfeeding How should I use this medication? Take this medication by mouth. Take it as directed on the prescription label at the same time every day. Keep taking it unless your care team tells you to stop. Talk to your  care team about the use of this medication in children. Special care may be needed. Overdosage: If you think you have taken too much of this medicine contact a poison control center or emergency room at once. NOTE: This medicine is only for you. Do not share this medicine with others. What if I miss a dose? If you miss a dose, take it as soon as you can. If it is almost time for your next dose, take only that dose. Do not take double or extra doses. What may interact with this medication? Do not take this medication with any of the following: Thioridazine This medication may also interact with the following: Certain medications for blood pressure, heart disease, irregular heartbeat Epinephrine NSAIDs, medications for pain and inflammation, such as ibuprofen  or naproxen  Warfarin Other medications may affect the way this medication works. Talk with your care team about all of the medications you take. They may suggest changes to your treatment plan to lower the risk of side effects and to make sure your medications work as intended. This list may not describe all possible interactions. Give your health care provider a list of all the medicines, herbs, non-prescription drugs, or dietary supplements you use. Also tell them if you smoke, drink alcohol, or use illegal drugs. Some items may interact with your medicine. What should I watch for while using this medication? Visit your care team for regular checks on your progress. Check your blood pressure as directed. Know what your blood pressure should be and when to contact your care team. This medication may affect your coordination, reaction time, or judgment. Do not drive or operate machinery until you know how this medication affects you. Sit up or stand slowly to reduce the risk of dizzy or fainting spells. Drinking alcohol with this medication can increase the risk of these side effects. Do not suddenly stop taking this medication. This may  increase your risk of side effects, such as chest pain and heart attack. If you no longer need to take this medication, your care team will lower the dose slowly over time to decrease the risk of side effects. If you are going to need surgery or a procedure, tell your care team that you are using this medication. This medication may affect blood glucose levels. It can also mask the symptoms of low blood sugar, such as a rapid heartbeat and tremors. If you have diabetes, it is important to check your blood sugar often while you are taking this medication. Do not treat yourself for coughs, colds, or pain while you are using this medication without asking your care team for advice. Some medications may increase your blood pressure. What side effects may I notice from receiving this medication? Side effects that you should report to your care team as soon as possible: Allergic reactions--skin rash, itching, hives, swelling of the face, lips, tongue, or throat Heart failure--shortness of breath, swelling of the ankles, feet, or hands, sudden weight gain, unusual weakness or fatigue Low blood pressure--dizziness, feeling faint or lightheaded, blurry vision Raynaud's--cool, numb, or painful fingers or toes that may change color from  pale, to blue, to red Redness, blistering, peeling, or loosening of the skin, including inside the mouth Slow heartbeat--dizziness, feeling faint or lightheaded, confusion, trouble breathing, unusual weakness or fatigue Worsening mood, feelings of depression Side effects that usually do not require medical attention (report to your care team if they continue or are bothersome): Change in sex drive or performance Diarrhea Dizziness Fatigue Headache This list may not describe all possible side effects. Call your doctor for medical advice about side effects. You may report side effects to FDA at 1-800-FDA-1088. Where should I keep my medication? Keep out of the reach of  children and pets. Store at room temperature between 20 and 25 degrees C (68 and 77 degrees F). Protect from light. Throw away any unused medication after the expiration date. NOTE: This sheet is a summary. It may not cover all possible information. If you have questions about this medicine, talk to your doctor, pharmacist, or health care provider.  2024 Elsevier/Gold Standard (2022-01-13 00:00:00)  While there are some safety concerns regarding triptan use during pregnancy, most studies indicate that triptans, particularly sumatriptan , are generally considered safe for treating migraine headaches. Triptans do not seem to be associated with a higher risk of major birth defects, and there's no robust evidence of increased miscarriage risk. However, it's always best to discuss medication use with a healthcare professional during pregnancy.  Here's a more detailed look: General Safety: No increased risk of birth defects: Studies have shown that triptan use during pregnancy does not appear to increase the risk of major congenital malformations, according to a study in the [employer] (NIH) (.gov).  Sumatriptan  is the most studied: Sumatriptan , a commonly used triptan, has been well-studied during pregnancy and is generally considered safe.  No extra monitoring needed: If a woman is taking a triptan as recommended by her doctor, no extra monitoring for birth defects is usually required.  Other triptans are also generally safe: Other triptans like rizatriptan, zolmitriptan, and eletriptan have also been studied in pregnancy and haven't shown an increased risk of birth defects.   Propranolol This sheet is about exposure to propranolol in pregnancy and while breastfeeding. This information is based on available published literature. It should not take the place of medical care and advice from your healthcare provider. What is propranolol? Propranolol is a medication that has been used to  treat high blood pressure, some heart conditions, overactive thyroid , tremors, glaucoma, and migraines. It belongs to the class of medications called beta-blockers. Some brand names for propranolol are Inderal, InnoPran XL, Detensol, Novo-Pranol, Deralin, and Cardinol. Sometimes when women find out they are pregnant, they think about changing how they take their medication, or stopping their medication altogether. However, it is important to talk with your healthcare providers before making any changes to how you take this medication. Your healthcare providers can talk with you about the benefits of treating your condition and the risks of untreated illness during pregnancy. I take propranolol. Can it make it harder for me to get pregnant? It is not known if propranolol can make it harder to get pregnant. Does taking propranolol increase the chance for miscarriage? Miscarriage can occur in any pregnancy for many different reasons. Studies have not been done to see if propranolol increases the chance for miscarriage. Does taking propranolol increase the chance of birth defects? Every pregnancy starts out with a 3-5% chance of having a birth defect. This is called the background risk. It is not known if propranolol increases  the chance for birth defects above the background risk. Studies on the use of betablockers in general during pregnancy have not reported an increased chance of birth defects. Does taking propranolol in pregnancy increase the chance of other pregnancy-related problems? Propranolol has been linked with reduced growth of the baby. However, it is not clear if this happens because of the medication, the condition being treated, or other factors. Studies have not shown an increased chance for other pregnancy-related problems, like preterm delivery (birth before week 37). The use of propranolol in late pregnancy may cause the baby to have symptoms of the drug acting on its heart, blood vessels, and  metabolism. These symptoms could include a slowed heart rate and low blood sugar. Not all babies exposed to propranolol will have these symptoms. It is important that your healthcare providers know you are taking propranolol so that if symptoms occur your baby can get the care that is best for them. Does taking propranolol in pregnancy affect future behavior or learning for the child? Studies have not been done to see if propranolol can cause behavior or learning issues for the child. Breastfeeding while taking propranolol: Propranolol passes into breastmilk in small amounts. Studies on propranolol have not found adverse health reactions in infants fed breastmilk from someone exposed to propranolol. If you suspect that the baby has symptoms such as being too sleepy or having trouble with feeding, contact the child's healthcare provider. Be sure to talk to your healthcare provider about all your breastfeeding questions. If a man takes propranolol, could it affect fertility or increase the chance of birth defects? Propranolol may cause some men to develop erectile dysfunction (ED), which could make it harder to conceive a pregnancy. In general, exposures that men have are unlikely to increase the risks to a pregnancy. For more information, please see the MotherToBaby fact sheet Paternal Exposures at http://www.walker-hill.info/.

## 2023-06-01 NOTE — Telephone Encounter (Signed)
 Patient was advised to be here at 730 am for an 8am appointment. She sent a mychart message early this morning that she would not arrive until 810 which is outside the office policy for appointment. I called patient and advised her that my next appointment is at 830 and if she is not here until 810 then we will have a limited amount of time. She was fine with that. However if she is later than this, she will have to be rescheduled. Or wait until 10am which is blocked for my admin time for 30 minutes but I will see patient at that time instead.

## 2023-06-02 ENCOUNTER — Encounter: Payer: Self-pay | Admitting: Neurology

## 2023-06-04 ENCOUNTER — Telehealth: Payer: Self-pay | Admitting: *Deleted

## 2023-06-04 NOTE — Telephone Encounter (Signed)
 Eletriptan PA needed. Pharmacy is requesting a drug change because it is not preferred, but patient has already tried sumatriptan .

## 2023-06-05 ENCOUNTER — Telehealth: Payer: Self-pay

## 2023-06-05 ENCOUNTER — Other Ambulatory Visit (HOSPITAL_COMMUNITY): Payer: Self-pay

## 2023-06-05 ENCOUNTER — Other Ambulatory Visit: Payer: Self-pay | Admitting: Nurse Practitioner

## 2023-06-05 DIAGNOSIS — R7989 Other specified abnormal findings of blood chemistry: Secondary | ICD-10-CM

## 2023-06-05 NOTE — Telephone Encounter (Signed)
 Pharmacy Patient Advocate Encounter   Received notification from Physician's Office that prior authorization for Eletriptan  Hydrobromide 40MG  tablets is required/requested.   Insurance verification completed.   The patient is insured through [facility] .   Per test claim:  Sumatriptan  and Rizatriptan is preferred by the insurance.  If suggested medication is appropriate, Please send in a new RX and discontinue this one. If not, please advise as to why it's not appropriate so that we may request a Prior Authorization. Please note, some preferred medications may still require a PA.  If the suggested medications have not been trialed and there are no contraindications to their use, the PA will not be submitted, as it will not be approved.  I see PT has tried Sumatriptan  but not Rizatriptan, Medicaid requires the trial of their two preferred unless PT has contraindication-please advise-

## 2023-06-08 ENCOUNTER — Other Ambulatory Visit: Payer: Self-pay | Admitting: Neurology

## 2023-06-08 DIAGNOSIS — R7989 Other specified abnormal findings of blood chemistry: Secondary | ICD-10-CM

## 2023-06-08 NOTE — Telephone Encounter (Signed)
 Regina Warren, she hs an allergy to sumatriptan  and rizatriptan is contraindicated with propranolol . Can we resubmit for eletriptan  agai in this case?

## 2023-06-09 ENCOUNTER — Ambulatory Visit: Payer: Self-pay

## 2023-06-09 ENCOUNTER — Telehealth: Payer: Self-pay | Admitting: Neurology

## 2023-06-09 ENCOUNTER — Other Ambulatory Visit (HOSPITAL_COMMUNITY): Payer: Self-pay

## 2023-06-09 NOTE — Telephone Encounter (Signed)
 Pharmacy Patient Advocate Encounter  Received notification from [facility] that Prior Authorization for Eletriptan  Hydrobromide 40MG  tablets has been APPROVED from 06/09/2023 to 06/08/2024. Unable to obtain price due to refill too soon rejection, last fill date 06/01/2023 next available fill date5/27/2025   PA #/Case ID/Reference #: PA Case ID #: [id number]

## 2023-06-09 NOTE — Telephone Encounter (Signed)
 [employer] Auth: [id number] exp. 06/09/23-08/07/23 sent to [facility]  [address] [place] [phone]

## 2023-06-10 ENCOUNTER — Encounter: Payer: Self-pay | Admitting: Neurology

## 2023-06-13 DIAGNOSIS — M62830 Muscle spasm of back: Secondary | ICD-10-CM | POA: Diagnosis not present

## 2023-06-13 DIAGNOSIS — M9912 Subluxation complex (vertebral) of thoracic region: Secondary | ICD-10-CM | POA: Diagnosis not present

## 2023-06-13 DIAGNOSIS — M9915 Subluxation complex (vertebral) of pelvic region: Secondary | ICD-10-CM | POA: Diagnosis not present

## 2023-06-13 DIAGNOSIS — M546 Pain in thoracic spine: Secondary | ICD-10-CM | POA: Diagnosis not present

## 2023-06-13 DIAGNOSIS — M9913 Subluxation complex (vertebral) of lumbar region: Secondary | ICD-10-CM | POA: Diagnosis not present

## 2023-06-13 DIAGNOSIS — M9911 Subluxation complex (vertebral) of cervical region: Secondary | ICD-10-CM | POA: Diagnosis not present

## 2023-06-13 DIAGNOSIS — M545 Low back pain, unspecified: Secondary | ICD-10-CM | POA: Diagnosis not present

## 2023-06-13 DIAGNOSIS — M542 Cervicalgia: Secondary | ICD-10-CM | POA: Diagnosis not present

## 2023-06-18 LAB — CORTISOL: Cortisol: 4.9 ug/dL — ABNORMAL LOW (ref 6.2–19.4)

## 2023-06-18 LAB — T4, FREE: Free T4: 0.97 ng/dL (ref 0.82–1.77)

## 2023-06-18 LAB — HORMONE PANEL (T4,TSH,FSH,TESTT,SHBG,DHEA,ETC)
DHEA-Sulfate, LCMS: 131 ug/dL
Estradiol, Serum, MS: 48 pg/mL
Estrone Sulfate: 44 ng/dL
Follicle Stimulating Hormone: 7.1 m[IU]/mL
Free T-3: 3.1 pg/mL
Free Testosterone, Serum: 4.6 pg/mL
Progesterone, Serum: <10 ng/dL
Sex Hormone Binding Globulin: 38.9 nmol/L
T4: 7 ug/dL
TSH: 1.9 uU/mL
Testosterone, Serum (Total): 22 ng/dL
Testosterone-% Free: 2.1 %
Triiodothyronine (T-3), Serum: 111 ng/dL

## 2023-06-18 LAB — FSH/LH
FSH: 6.1 m[IU]/mL
LH: 5.3 m[IU]/mL

## 2023-06-18 LAB — ESTRADIOL: Estradiol: 47.5 pg/mL

## 2023-06-18 LAB — TSH: TSH: 1.77 u[IU]/mL (ref 0.450–4.500)

## 2023-06-18 LAB — TESTOSTERONE: Testosterone: 37 ng/dL (ref 13–71)

## 2023-06-18 LAB — PROLACTIN: Prolactin: 17 ng/mL (ref 4.8–33.4)

## 2023-06-18 LAB — PROGESTERONE: Progesterone: 0.3 ng/mL

## 2023-06-18 LAB — ACTH: ACTH: 8.6 pg/mL (ref 7.2–63.3)

## 2023-06-21 DIAGNOSIS — R519 Headache, unspecified: Secondary | ICD-10-CM | POA: Diagnosis not present

## 2023-06-23 NOTE — Telephone Encounter (Signed)
 I called [facility]. MRI was on Sunday. They were closed yesterday. Expect results tomorrow or Thursday.

## 2023-06-26 ENCOUNTER — Ambulatory Visit: Payer: Self-pay | Admitting: Nurse Practitioner

## 2023-06-30 ENCOUNTER — Telehealth: Payer: Self-pay | Admitting: *Deleted

## 2023-06-30 ENCOUNTER — Telehealth: Payer: Self-pay | Admitting: Neurology

## 2023-06-30 DIAGNOSIS — R002 Palpitations: Secondary | ICD-10-CM | POA: Diagnosis not present

## 2023-06-30 NOTE — Telephone Encounter (Signed)
 Spoke to patient, at urgent care, doing well, BP and pulse has been reported normal and they just finished an EKG. If all norma can resume propranolol  especially since she was getting good headache relief from it.

## 2023-06-30 NOTE — Telephone Encounter (Signed)
 I spoke to patient. [place] [employer] and walking around. She was started on low dose propranolol  10mg  twice daily for migraines and stopped abruptly on her own because she thought it would react with naproxen . Now she is c/o labile blood pressure and palpitations. Stopping propranolol  abruptly can cause rebound hypertension and tachycardia unfortunately I can't be sure that abruptly stopping the propranolol  is the cause of these symptoms as she was on such a low dose and not for very long(less than a month) and she has been off of the propranolol  for a week. I advised urgent care she is going to proceed, she did not sound in distress and symptoms did not sound severe, not short of breath, mental status normal, I think urgent care can see her and hopefully get a blood pressure, pulse, EKG and make sure she is ok.

## 2023-06-30 NOTE — Telephone Encounter (Signed)
 Received MRI brain without and with contrast results from [facility]. MRI date 06/21/23.   Impression:  Normal brain MRI without and with IV contrast.   Signed by: Terressa Fess MD

## 2023-06-30 NOTE — Telephone Encounter (Signed)
 We don;t recommend just stopping medication. You  can take narpoxen with the propranolol . Sometimes when stopping propranolol  abruptly you can get a rebound hypertension effect. I would recommend taking the propranolol  if it was helping and if she has anymore concerning symptoms going to the emergency room.

## 2023-07-01 NOTE — Telephone Encounter (Signed)
 Noted thanks

## 2023-07-28 DIAGNOSIS — M9915 Subluxation complex (vertebral) of pelvic region: Secondary | ICD-10-CM | POA: Diagnosis not present

## 2023-07-28 DIAGNOSIS — M9911 Subluxation complex (vertebral) of cervical region: Secondary | ICD-10-CM | POA: Diagnosis not present

## 2023-07-28 DIAGNOSIS — M542 Cervicalgia: Secondary | ICD-10-CM | POA: Diagnosis not present

## 2023-07-28 DIAGNOSIS — M546 Pain in thoracic spine: Secondary | ICD-10-CM | POA: Diagnosis not present

## 2023-07-28 DIAGNOSIS — M9913 Subluxation complex (vertebral) of lumbar region: Secondary | ICD-10-CM | POA: Diagnosis not present

## 2023-07-28 DIAGNOSIS — M545 Low back pain, unspecified: Secondary | ICD-10-CM | POA: Diagnosis not present

## 2023-07-28 DIAGNOSIS — M62830 Muscle spasm of back: Secondary | ICD-10-CM | POA: Diagnosis not present

## 2023-07-28 DIAGNOSIS — M9912 Subluxation complex (vertebral) of thoracic region: Secondary | ICD-10-CM | POA: Diagnosis not present

## 2023-08-13 ENCOUNTER — Other Ambulatory Visit: Payer: Self-pay | Admitting: Nurse Practitioner

## 2023-08-13 DIAGNOSIS — E559 Vitamin D deficiency, unspecified: Secondary | ICD-10-CM

## 2023-08-14 DIAGNOSIS — M62830 Muscle spasm of back: Secondary | ICD-10-CM | POA: Diagnosis not present

## 2023-08-14 DIAGNOSIS — M545 Low back pain, unspecified: Secondary | ICD-10-CM | POA: Diagnosis not present

## 2023-08-14 DIAGNOSIS — M546 Pain in thoracic spine: Secondary | ICD-10-CM | POA: Diagnosis not present

## 2023-08-14 DIAGNOSIS — M9915 Subluxation complex (vertebral) of pelvic region: Secondary | ICD-10-CM | POA: Diagnosis not present

## 2023-08-14 DIAGNOSIS — M9911 Subluxation complex (vertebral) of cervical region: Secondary | ICD-10-CM | POA: Diagnosis not present

## 2023-08-14 DIAGNOSIS — M542 Cervicalgia: Secondary | ICD-10-CM | POA: Diagnosis not present

## 2023-08-14 DIAGNOSIS — M9912 Subluxation complex (vertebral) of thoracic region: Secondary | ICD-10-CM | POA: Diagnosis not present

## 2023-08-14 DIAGNOSIS — M9913 Subluxation complex (vertebral) of lumbar region: Secondary | ICD-10-CM | POA: Diagnosis not present

## 2023-08-19 ENCOUNTER — Ambulatory Visit: Payer: Self-pay | Admitting: Nurse Practitioner

## 2023-09-01 DIAGNOSIS — U071 COVID-19: Secondary | ICD-10-CM | POA: Diagnosis not present

## 2023-09-01 DIAGNOSIS — R051 Acute cough: Secondary | ICD-10-CM | POA: Diagnosis not present

## 2023-09-01 DIAGNOSIS — J3489 Other specified disorders of nose and nasal sinuses: Secondary | ICD-10-CM | POA: Diagnosis not present

## 2023-09-12 DIAGNOSIS — M9915 Subluxation complex (vertebral) of pelvic region: Secondary | ICD-10-CM | POA: Diagnosis not present

## 2023-09-12 DIAGNOSIS — M545 Low back pain, unspecified: Secondary | ICD-10-CM | POA: Diagnosis not present

## 2023-09-12 DIAGNOSIS — M546 Pain in thoracic spine: Secondary | ICD-10-CM | POA: Diagnosis not present

## 2023-09-12 DIAGNOSIS — M9911 Subluxation complex (vertebral) of cervical region: Secondary | ICD-10-CM | POA: Diagnosis not present

## 2023-09-12 DIAGNOSIS — M542 Cervicalgia: Secondary | ICD-10-CM | POA: Diagnosis not present

## 2023-09-12 DIAGNOSIS — M9913 Subluxation complex (vertebral) of lumbar region: Secondary | ICD-10-CM | POA: Diagnosis not present

## 2023-09-12 DIAGNOSIS — M9912 Subluxation complex (vertebral) of thoracic region: Secondary | ICD-10-CM | POA: Diagnosis not present

## 2023-09-12 DIAGNOSIS — M62830 Muscle spasm of back: Secondary | ICD-10-CM | POA: Diagnosis not present

## 2023-09-16 ENCOUNTER — Ambulatory Visit: Payer: Self-pay | Admitting: Nurse Practitioner

## 2023-09-17 ENCOUNTER — Ambulatory Visit: Payer: Self-pay | Admitting: Nurse Practitioner

## 2023-09-23 ENCOUNTER — Telehealth: Payer: Self-pay | Admitting: Neurology

## 2023-09-23 NOTE — Telephone Encounter (Signed)
 LVM and sent mychart msg informing pt of appt change - MD schedule change

## 2023-09-25 DIAGNOSIS — M9912 Subluxation complex (vertebral) of thoracic region: Secondary | ICD-10-CM | POA: Diagnosis not present

## 2023-09-25 DIAGNOSIS — M545 Low back pain, unspecified: Secondary | ICD-10-CM | POA: Diagnosis not present

## 2023-09-25 DIAGNOSIS — M542 Cervicalgia: Secondary | ICD-10-CM | POA: Diagnosis not present

## 2023-09-25 DIAGNOSIS — M9915 Subluxation complex (vertebral) of pelvic region: Secondary | ICD-10-CM | POA: Diagnosis not present

## 2023-09-25 DIAGNOSIS — M62830 Muscle spasm of back: Secondary | ICD-10-CM | POA: Diagnosis not present

## 2023-09-25 DIAGNOSIS — M546 Pain in thoracic spine: Secondary | ICD-10-CM | POA: Diagnosis not present

## 2023-09-25 DIAGNOSIS — M9911 Subluxation complex (vertebral) of cervical region: Secondary | ICD-10-CM | POA: Diagnosis not present

## 2023-09-25 DIAGNOSIS — M9913 Subluxation complex (vertebral) of lumbar region: Secondary | ICD-10-CM | POA: Diagnosis not present

## 2023-10-04 ENCOUNTER — Other Ambulatory Visit: Payer: Self-pay | Admitting: Nurse Practitioner

## 2023-10-04 DIAGNOSIS — E559 Vitamin D deficiency, unspecified: Secondary | ICD-10-CM

## 2023-10-08 ENCOUNTER — Other Ambulatory Visit: Payer: Self-pay | Admitting: Nurse Practitioner

## 2023-10-08 DIAGNOSIS — G4489 Other headache syndrome: Secondary | ICD-10-CM

## 2023-10-15 DIAGNOSIS — M545 Low back pain, unspecified: Secondary | ICD-10-CM | POA: Diagnosis not present

## 2023-10-15 DIAGNOSIS — M9913 Subluxation complex (vertebral) of lumbar region: Secondary | ICD-10-CM | POA: Diagnosis not present

## 2023-10-15 DIAGNOSIS — M9912 Subluxation complex (vertebral) of thoracic region: Secondary | ICD-10-CM | POA: Diagnosis not present

## 2023-10-15 DIAGNOSIS — M546 Pain in thoracic spine: Secondary | ICD-10-CM | POA: Diagnosis not present

## 2023-10-15 DIAGNOSIS — M542 Cervicalgia: Secondary | ICD-10-CM | POA: Diagnosis not present

## 2023-10-15 DIAGNOSIS — M62830 Muscle spasm of back: Secondary | ICD-10-CM | POA: Diagnosis not present

## 2023-10-15 DIAGNOSIS — M9911 Subluxation complex (vertebral) of cervical region: Secondary | ICD-10-CM | POA: Diagnosis not present

## 2023-10-15 DIAGNOSIS — M9915 Subluxation complex (vertebral) of pelvic region: Secondary | ICD-10-CM | POA: Diagnosis not present

## 2023-10-16 ENCOUNTER — Other Ambulatory Visit: Payer: Self-pay | Admitting: Nurse Practitioner

## 2023-10-16 DIAGNOSIS — R7989 Other specified abnormal findings of blood chemistry: Secondary | ICD-10-CM

## 2023-10-21 ENCOUNTER — Other Ambulatory Visit: Payer: Self-pay | Admitting: Nurse Practitioner

## 2023-10-21 DIAGNOSIS — L709 Acne, unspecified: Secondary | ICD-10-CM

## 2023-10-22 DIAGNOSIS — M9912 Subluxation complex (vertebral) of thoracic region: Secondary | ICD-10-CM | POA: Diagnosis not present

## 2023-10-22 DIAGNOSIS — M62830 Muscle spasm of back: Secondary | ICD-10-CM | POA: Diagnosis not present

## 2023-10-22 DIAGNOSIS — M546 Pain in thoracic spine: Secondary | ICD-10-CM | POA: Diagnosis not present

## 2023-10-22 DIAGNOSIS — M542 Cervicalgia: Secondary | ICD-10-CM | POA: Diagnosis not present

## 2023-10-22 DIAGNOSIS — M9915 Subluxation complex (vertebral) of pelvic region: Secondary | ICD-10-CM | POA: Diagnosis not present

## 2023-10-22 DIAGNOSIS — M9913 Subluxation complex (vertebral) of lumbar region: Secondary | ICD-10-CM | POA: Diagnosis not present

## 2023-10-22 DIAGNOSIS — M9911 Subluxation complex (vertebral) of cervical region: Secondary | ICD-10-CM | POA: Diagnosis not present

## 2023-10-22 DIAGNOSIS — M545 Low back pain, unspecified: Secondary | ICD-10-CM | POA: Diagnosis not present

## 2023-10-23 ENCOUNTER — Other Ambulatory Visit: Payer: Self-pay

## 2023-10-23 ENCOUNTER — Encounter: Payer: Self-pay | Admitting: Nurse Practitioner

## 2023-10-23 ENCOUNTER — Ambulatory Visit: Payer: Self-pay | Admitting: Nurse Practitioner

## 2023-10-23 VITALS — BP 125/87 | HR 93 | Ht 65.0 in | Wt 193.4 lb

## 2023-10-23 DIAGNOSIS — Z0000 Encounter for general adult medical examination without abnormal findings: Secondary | ICD-10-CM | POA: Diagnosis not present

## 2023-10-23 DIAGNOSIS — K5909 Other constipation: Secondary | ICD-10-CM

## 2023-10-23 DIAGNOSIS — R7301 Impaired fasting glucose: Secondary | ICD-10-CM | POA: Diagnosis not present

## 2023-10-23 DIAGNOSIS — E559 Vitamin D deficiency, unspecified: Secondary | ICD-10-CM | POA: Diagnosis not present

## 2023-10-23 DIAGNOSIS — R1030 Lower abdominal pain, unspecified: Secondary | ICD-10-CM

## 2023-10-23 DIAGNOSIS — G4489 Other headache syndrome: Secondary | ICD-10-CM | POA: Diagnosis not present

## 2023-10-23 DIAGNOSIS — Z13220 Encounter for screening for lipoid disorders: Secondary | ICD-10-CM | POA: Diagnosis not present

## 2023-10-23 DIAGNOSIS — K429 Umbilical hernia without obstruction or gangrene: Secondary | ICD-10-CM

## 2023-10-23 LAB — POCT URINALYSIS DIP (CLINITEK)
Bilirubin, UA: NEGATIVE
Blood, UA: NEGATIVE
Glucose, UA: NEGATIVE mg/dL
Ketones, POC UA: NEGATIVE mg/dL
Leukocytes, UA: NEGATIVE
Nitrite, UA: NEGATIVE
POC PROTEIN,UA: NEGATIVE
Spec Grav, UA: >=1.03 — AB (ref 1.010–1.025)
Urobilinogen, UA: 1 U/dL
pH, UA: 6 (ref 5.0–8.0)

## 2023-10-23 MED ORDER — TRETINOIN 0.1 % EX CREA: TOPICAL_CREAM | Freq: Every day | CUTANEOUS | 3 refills | Status: AC

## 2023-10-23 MED ORDER — BENZOYL PEROXIDE-ERYTHROMYCIN 5-3 % EX GEL: Freq: Every day | CUTANEOUS | 3 refills | Status: DC

## 2023-10-23 MED ORDER — TRETINOIN 0.1 % EX CREA: TOPICAL_CREAM | Freq: Every day | CUTANEOUS | 2 refills | Status: DC

## 2023-10-23 MED ORDER — BENZOYL PEROXIDE-ERYTHROMYCIN 5-3 % EX GEL: Freq: Every day | CUTANEOUS | 2 refills | Status: DC

## 2023-10-23 MED ORDER — IBUPROFEN 800 MG PO TABS: ORAL_TABLET | ORAL | 0 refills | Status: AC

## 2023-10-23 NOTE — Progress Notes (Signed)
 Subjective   Patient ID: Regina Warren, female    DOB: 1995-04-04, 28 y.o.   MRN: [id number]  Chief Complaint  Patient presents with   Medical Management of Chronic Issues    Pt presents for a 3 mon f/u . Pt was having abdominal discomfort and would like to have her kidneys checked.     Referring provider: Oley Bascom RAMAN, NP  Regina Warren is a 28 y.o. female with Past Medical History: No date: Abdominal pain No date: Anxiety 03/2019: Discomfort of back 06/2018: Vitamin D  deficiency   HPI  Patient presents today for follow-up visit.  She states that she does need a new referral to GI for chronic constipation.  She also has been seen previously for umbilical hernia and would like to be referred back to general surgery to follow-up on this.  Patient does need repeat labs today.  She does need refills on her medications today. Denies f/c/s, n/v/d, hemoptysis, PND, leg swelling Denies chest pain or edema        Allergies  Allergen Reactions   Imitrex  Delany.Dark ]     made my brain feel like it was about to explode    Immunization History  Administered Date(s) Administered   Influenza,inj,Quad PF,6+ Mos 01/14/2017, 01/06/2018, 10/05/2018, 10/04/2019   PPD Test 09/03/2021    Tobacco History: Social History   Tobacco Use  Smoking Status Never  Smokeless Tobacco Never   Counseling given: Not Answered   Outpatient Encounter Medications as of 10/23/2023  Medication Sig   Vitamin D , Ergocalciferol , (DRISDOL ) 1.25 MG (50000 UNIT) CAPS capsule TAKE 1 CAPSULE BY MOUTH EVERY 7 DAYS FOR 8 DOSES   [DISCONTINUED] ibuprofen  (ADVIL ) 800 MG tablet TAKE 1 TABLET(800 MG) BY MOUTH THREE TIMES DAILY AS NEEDED   [DISCONTINUED] tretinoin  (RETIN-A ) 0.1 % cream Apply topically at bedtime.   benzoyl peroxide -erythromycin  (BENZAMYCIN) gel Apply topically daily.   eletriptan  (RELPAX ) 40 MG tablet Take 1 tablet (40 mg total) by mouth as needed for migraine or  headache. May repeat in 2 hours if headache persists or recurs. (Patient not taking: Reported on 10/23/2023)   ibuprofen  (ADVIL ) 800 MG tablet TAKE 1 TABLET(800 MG) BY MOUTH THREE TIMES DAILY AS NEEDED   propranolol  (INDERAL ) 10 MG tablet Take 1 tablet (10 mg total) by mouth 2 (two) times daily. (Patient not taking: Reported on 10/23/2023)   tretinoin  (RETIN-A ) 0.1 % cream Apply topically at bedtime.   [DISCONTINUED] benzoyl peroxide -erythromycin  (BENZAMYCIN) gel Apply 1 a small amount to affected area once a day   [DISCONTINUED] benzoyl peroxide -erythromycin  (BENZAMYCIN) gel Apply topically daily.   [DISCONTINUED] tretinoin  (RETIN-A ) 0.1 % cream Apply topically at bedtime.   No facility-administered encounter medications on file as of 10/23/2023.    Review of Systems  Review of Systems  Constitutional: Negative.   HENT: Negative.    Cardiovascular: Negative.   Gastrointestinal: Negative.   Allergic/Immunologic: Negative.   Neurological: Negative.   Psychiatric/Behavioral: Negative.       Objective:   BP 125/87 (BP Location: Right Arm, Patient Position: Sitting, Cuff Size: Normal)   Pulse 93   Ht 5' 5 (1.651 m)   Wt 193 lb 6.4 oz (87.7 kg)   BMI 32.18 kg/m   Wt Readings from Last 5 Encounters:  10/23/23 193 lb 6.4 oz (87.7 kg)  06/01/23 188 lb 9.6 oz (85.5 kg)  02/27/23 188 lb (85.3 kg)  02/19/23 188 lb (85.3 kg)  06/26/22 187 lb (84.8 kg)     Physical Exam  Vitals and nursing note reviewed.  Constitutional:      General: She is not in acute distress.    Appearance: She is well-developed.  Cardiovascular:     Rate and Rhythm: Normal rate and regular rhythm.  Pulmonary:     Effort: Pulmonary effort is normal.     Breath sounds: Normal breath sounds.  Neurological:     Mental Status: She is alert and oriented to person, place, and time.       Assessment & Plan:   Lower abdominal pain -     POCT URINALYSIS DIP (CLINITEK) -     Ambulatory referral to  Gastroenterology  Other headache syndrome -     Ibuprofen ; TAKE 1 TABLET(800 MG) BY MOUTH THREE TIMES DAILY AS NEEDED  Dispense: 30 tablet; Refill: 0  Chronic constipation -     Ambulatory referral to Gastroenterology  Umbilical hernia without obstruction and without gangrene -     Ambulatory referral to General Surgery  Lipid screening -     Lipid panel  Vitamin D  deficiency -     VITAMIN D  25 Hydroxy (Vit-D Deficiency, Fractures)  Routine adult health maintenance -     CBC -     Comprehensive metabolic panel with GFR  Impaired fasting glucose -     Hemoglobin A1c  Other orders -     Benzoyl Peroxide -Erythromycin ; Apply topically daily.  Dispense: 23.3 g; Refill: 3 -     Tretinoin ; Apply topically at bedtime.  Dispense: 45 g; Refill: 3     Return if symptoms worsen or fail to improve.   Bascom GORMAN Borer, NP 10/23/2023

## 2023-10-24 LAB — COMPREHENSIVE METABOLIC PANEL WITH GFR
ALT: 20 IU/L (ref 0–32)
AST: 17 IU/L (ref 0–40)
Albumin: 4.3 g/dL (ref 4.0–5.0)
Alkaline Phosphatase: 75 IU/L (ref 41–116)
BUN/Creatinine Ratio: 15 (ref 9–23)
BUN: 10 mg/dL (ref 6–20)
Bilirubin Total: 0.4 mg/dL (ref 0.0–1.2)
CO2: 21 mmol/L (ref 20–29)
Calcium: 9.4 mg/dL (ref 8.7–10.2)
Chloride: 103 mmol/L (ref 96–106)
Creatinine, Ser: 0.67 mg/dL (ref 0.57–1.00)
Globulin, Total: 2.7 g/dL (ref 1.5–4.5)
Glucose: 82 mg/dL (ref 70–99)
Potassium: 4.3 mmol/L (ref 3.5–5.2)
Sodium: 137 mmol/L (ref 134–144)
Total Protein: 7 g/dL (ref 6.0–8.5)
eGFR: 121 mL/min/1.73 (ref >59)

## 2023-10-24 LAB — LIPID PANEL
Chol/HDL Ratio: 3.1 ratio (ref 0.0–4.4)
Cholesterol, Total: 154 mg/dL (ref 100–199)
HDL: 50 mg/dL (ref >39)
LDL Chol Calc (NIH): 97 mg/dL (ref 0–99)
Triglycerides: 29 mg/dL (ref 0–149)
VLDL Cholesterol Cal: 7 mg/dL (ref 5–40)

## 2023-10-24 LAB — CBC
Hematocrit: 40.5 % (ref 34.0–46.6)
Hemoglobin: 13.4 g/dL (ref 11.1–15.9)
MCH: 31.8 pg (ref 26.6–33.0)
MCHC: 33.1 g/dL (ref 31.5–35.7)
MCV: 96 fL (ref 79–97)
Platelets: 302 x10E3/uL (ref 150–450)
RBC: 4.21 x10E6/uL (ref 3.77–5.28)
RDW: 12 % (ref 11.7–15.4)
WBC: 6.6 x10E3/uL (ref 3.4–10.8)

## 2023-10-24 LAB — VITAMIN D 25 HYDROXY (VIT D DEFICIENCY, FRACTURES): Vit D, 25-Hydroxy: 19.2 ng/mL — ABNORMAL LOW (ref 30.0–100.0)

## 2023-10-24 LAB — HEMOGLOBIN A1C
Est. average glucose Bld gHb Est-mCnc: 108 mg/dL
Hgb A1c MFr Bld: 5.4 % (ref 4.8–5.6)

## 2023-10-26 ENCOUNTER — Ambulatory Visit: Payer: Self-pay | Admitting: Nurse Practitioner

## 2023-10-26 ENCOUNTER — Telehealth: Admitting: Neurology

## 2023-10-27 ENCOUNTER — Encounter: Payer: Self-pay | Admitting: Gastroenterology

## 2023-10-30 ENCOUNTER — Other Ambulatory Visit: Payer: Self-pay | Admitting: Nurse Practitioner

## 2023-10-30 MED ORDER — BENZOYL PEROXIDE-ERYTHROMYCIN 5-3 % EX GEL: Freq: Every day | CUTANEOUS | 3 refills | Status: AC

## 2023-11-02 ENCOUNTER — Telehealth: Payer: Self-pay | Admitting: Neurology

## 2023-11-02 NOTE — Telephone Encounter (Signed)
 Called patient to schedule a follow up appt. As per [place] notes. Patient stated she spoke to [place] and everything was normal, she just need a referral

## 2023-11-02 NOTE — Telephone Encounter (Signed)
 LVM and sent mychart msg informing pt of need to reschedule 11/13/23 appt - MD departure

## 2023-11-03 ENCOUNTER — Encounter: Payer: Self-pay | Admitting: Gastroenterology

## 2023-11-03 ENCOUNTER — Ambulatory Visit: Admitting: Gastroenterology

## 2023-11-03 VITALS — BP 122/70 | HR 100 | Ht 65.0 in | Wt 197.0 lb

## 2023-11-03 DIAGNOSIS — R140 Abdominal distension (gaseous): Secondary | ICD-10-CM

## 2023-11-03 DIAGNOSIS — R1084 Generalized abdominal pain: Secondary | ICD-10-CM

## 2023-11-03 DIAGNOSIS — K5909 Other constipation: Secondary | ICD-10-CM

## 2023-11-03 DIAGNOSIS — Z800 Family history of malignant neoplasm of digestive organs: Secondary | ICD-10-CM

## 2023-11-03 MED ORDER — NA SULFATE-K SULFATE-MG SULF 17.5-3.13-1.6 GM/177ML PO SOLN: 1.0000 | Freq: Once | ORAL | 0 refills | Status: AC

## 2023-11-03 NOTE — Progress Notes (Signed)
 [facility] Consult Note:  History: Regina Warren 11/03/2023  Referring provider: Oley Bascom RAMAN, NP  Reason for consult/chief complaint: Abdominal Pain (Pt states she has an biblical  hernia, pt states she is having some abd pain, pt states the hernia seems to be pushed inward at this time ), Bloated (Pt states she is having a lot of bloating ), and Constipation (Pt states she has been having a lot of constipation, pt having incomplete BMs)   [facility] Hx:  seen here in early 2022 for anal fissure that had healed on f/u visit   Subjective  HPI:  Regina Warren came to see me today for abdominal pain and constipation.  She had previously tended toward intermittent constipation, but it seems worse over at least the last 6 months.  She has the urge for bowel movements but they are infrequent and she often feels incompletely evacuated.  She has fairly generalized abdominal discomfort and bloating, and at times her umbilical hernia seems more prominent.  Pain is sometimes focused more toward the right side of the umbilicus.  No nausea vomiting dysphagia or rectal bleeding.  She is becoming increasingly concerned the longer the symptoms have gone on.  She also relates that her father was recently diagnosed with colorectal cancer. She reports going to her primary care provider regularly as well as her gynecologist, and that she sees providers here in [place] because she knows and trusts them even though she now lives and works in [place].  ROS:  Review of Systems  Constitutional:  Negative for appetite change and unexpected weight change.  HENT:  Negative for mouth sores and voice change.   Eyes:  Negative for pain and redness.  Respiratory:  Negative for cough and shortness of breath.   Cardiovascular:  Negative for chest pain and palpitations.  Genitourinary:  Negative for dysuria and hematuria.  Musculoskeletal:  Negative for arthralgias and myalgias.  Skin:   Negative for pallor and rash.  Neurological:  Positive for headaches. Negative for weakness.  Hematological:  Negative for adenopathy.     Past Medical History: Past Medical History:  Diagnosis Date   Abdominal pain    Anxiety    Discomfort of back 03/2019   Vitamin D  deficiency 06/2018     Past Surgical History: Past Surgical History:  Procedure Laterality Date   lung puncture       Family History: Family History  Problem Relation Age of Onset   Rectal cancer Father    Colon cancer Maternal Grandmother    Breast cancer Paternal Grandmother    Mental illness Neg Hx    Father recently Dx with CRC   Social History: Social History   Socioeconomic History   Marital status: Single    Spouse name: Not on file   Number of children: 1   Years of education: Not on file   Highest education level: Not on file  Occupational History   Occupation: case Financial controller   Occupation: Runner, broadcasting/film/video  Tobacco Use   Smoking status: Never   Smokeless tobacco: Never  Vaping Use   Vaping status: Never Used  Substance and Sexual Activity   Alcohol use: Not Currently    Comment: occasionally   Drug use: Never   Sexual activity: Yes    Birth control/protection: None, Condom  Other Topics Concern   Not on file  Social History Narrative   ** Merged History Encounter **       Social Drivers of Health  Financial Resource Strain: Not on file  Food Insecurity: No Food Insecurity (02/19/2023)   Hunger Vital Sign    Worried About Running Out of Food in the Last Year: Never true    Ran Out of Food in the Last Year: Never true  Transportation Needs: No Transportation Needs (02/19/2023)   PRAPARE - Administrator, Civil Service (Medical): No    Lack of Transportation (Non-Medical): No  Physical Activity: Not on file  Stress: Not on file  Social Connections: Not on file    Allergies: Allergies  Allergen Reactions   Imitrex  [Sumatriptan ]     made my brain feel like it was  about to explode    Outpatient Meds: Current Outpatient Medications  Medication Sig Dispense Refill   benzoyl peroxide -erythromycin  (BENZAMYCIN) gel Apply topically daily. 46.6 g 3   ibuprofen  (ADVIL ) 800 MG tablet TAKE 1 TABLET(800 MG) BY MOUTH THREE TIMES DAILY AS NEEDED 30 tablet 0   tretinoin  (RETIN-A ) 0.1 % cream Apply topically at bedtime. 45 g 3   Vitamin D , Ergocalciferol , (DRISDOL ) 1.25 MG (50000 UNIT) CAPS capsule TAKE 1 CAPSULE BY MOUTH EVERY 7 DAYS FOR 8 DOSES 8 capsule 0   eletriptan  (RELPAX ) 40 MG tablet Take 1 tablet (40 mg total) by mouth as needed for migraine or headache. May repeat in 2 hours if headache persists or recurs. (Patient not taking: Reported on 11/03/2023) 9 tablet 11   propranolol  (INDERAL ) 10 MG tablet Take 1 tablet (10 mg total) by mouth 2 (two) times daily. (Patient not taking: Reported on 11/03/2023) 60 tablet 6   No current facility-administered medications for this visit.      ___________________________________________________________________ Objective   Exam:  BP 122/70   Pulse 100   Ht 5' 5 (1.651 m)   Wt 197 lb (89.4 kg)   BMI 32.78 kg/m  Wt Readings from Last 3 Encounters:  11/03/23 197 lb (89.4 kg)  10/23/23 193 lb 6.4 oz (87.7 kg)  06/01/23 188 lb 9.6 oz (85.5 kg)    General: Well-appearing Eyes: sclera anicteric, no redness ENT: oral mucosa moist without lesions, no cervical or supraclavicular lymphadenopathy CV: Regular without appreciable murmur, no JVD, no peripheral edema Resp: clear to auscultation bilaterally, normal RR and effort noted GI: soft, overweight, focal tenderness a little to the right of the umbilicus without appreciable fullness or mass, with active bowel sounds. No guarding or palpable organomegaly noted. Skin; warm and dry, no rash or jaundice noted Neuro: awake, alert and oriented x 3. Normal gross motor function and fluent speech  Labs:     Latest Ref Rng & Units 10/23/2023    2:16 PM 02/19/2023     2:08 PM 06/26/2022    9:34 AM  CBC  WBC 3.4 - 10.8 x10E3/uL 6.6  5.8  3.6   Hemoglobin 11.1 - 15.9 g/dL 86.5  86.6  86.6   Hematocrit 34.0 - 46.6 % 40.5  39.3  38.5   Platelets 150 - 450 x10E3/uL 302  308  304       Latest Ref Rng & Units 10/23/2023    2:16 PM 02/19/2023    2:08 PM 06/26/2022    9:34 AM  CMP  Glucose 70 - 99 mg/dL 82  86  99   BUN 6 - 20 mg/dL 10  8  7    Creatinine 0.57 - 1.00 mg/dL 9.32  9.20  9.30   Sodium 134 - 144 mmol/L 137  139  137   Potassium 3.5 -  5.2 mmol/L 4.3  4.2  4.7   Chloride 96 - 106 mmol/L 103  104  102   CO2 20 - 29 mmol/L 21  21  22    Calcium 8.7 - 10.2 mg/dL 9.4  9.4  9.5   Total Protein 6.0 - 8.5 g/dL 7.0  6.9  7.1   Total Bilirubin 0.0 - 1.2 mg/dL 0.4  0.4  0.3   Alkaline Phos 41 - 116 IU/L 75  76  76   AST 0 - 40 IU/L 17  20  17    ALT 0 - 32 IU/L 20  19  19     Lab Results  Component Value Date   TSH 1.770 06/01/2023    Assessment: Encounter Diagnoses  Name Primary?   Generalized abdominal pain Yes   Abdominal bloating    Chronic constipation     Worsening constipation with associated abdominal bloating and pain.  No associated gynecologic symptoms and reports being up-to-date with regular GYN care. Father recently diagnosed with colorectal cancer Has gained some weight, does not appear to have obvious ascites on exam  Plan:  Further investigation with abdominal and pelvic ultrasound (including transvaginal ultrasound)  Colonoscopy scheduled.  She was agreeable after discussion of procedure and risks  The benefits and risks of the planned procedure(s) were described in detail with the patient or (when appropriate) their health care proxy.  Risks were outlined as including, but not limited to, bleeding, infection, perforation, adverse medication reaction leading to cardiac or pulmonary decompensation, pancreatitis (if ERCP).  The limitation of incomplete mucosal visualization was also discussed.  No guarantees or warranties were  given.  She was advised by primary care to use MiraLAX  regularly, which does help move her bowels more but did not seem to help the pain much.  She will continue this at least until the colonoscopy with reevaluation of treatment afterward.  Thank you for the courtesy of this consult.  Please call me with any questions or concerns.  Victory LITTIE Brand III  CC: Referring provider noted above

## 2023-11-03 NOTE — Patient Instructions (Signed)
 You have been scheduled for an abdominal ultrasound at [facility] Radiology (1st floor of hospital) on 11/13/23 at 3:30 pm. Please arrive 15 minutes prior to your appointment for registration. You will need to drink 32 oz of water an hour before you arrive and hold your bladder. Should you need to reschedule your appointment, please contact radiology at [phone]. This test typically takes about 30 minutes to perform.  You have been scheduled for a colonoscopy. Please follow written instructions given to you at your visit today.   If you use inhalers (even only as needed), please bring them with you on the day of your procedure.  DO NOT TAKE 7 DAYS PRIOR TO TEST- Trulicity (dulaglutide) Ozempic, Wegovy (semaglutide) Mounjaro (tirzepatide) Bydureon Bcise (exanatide extended release)  DO NOT TAKE 1 DAY PRIOR TO YOUR TEST Rybelsus (semaglutide) Adlyxin (lixisenatide) Victoza (liraglutide) Byetta (exanatide) ___________________________________________________________________________  _______________________________________________________  If your blood pressure at your visit was 140/90 or greater, please contact your primary care physician to follow up on this.  _______________________________________________________  If you are age 1 or older, your body mass index should be between 23-30. Your Body mass index is 32.78 kg/m. If this is out of the aforementioned range listed, please consider follow up with your Primary Care Provider.  If you are age 30 or younger, your body mass index should be between 19-25. Your Body mass index is 32.78 kg/m. If this is out of the aformentioned range listed, please consider follow up with your Primary Care Provider.   ________________________________________________________  The [facility] providers would like to encourage you to use MYCHART to communicate with providers for non-urgent requests or questions.  Due to long hold times on the  telephone, sending your provider a message by [facility] may be a faster and more efficient way to get a response.  Please allow 48 business hours for a response.  Please remember that this is for non-urgent requests.  _______________________________________________________  [facility] is using a team-based approach to care.  Your team is made up of your doctor and two to three APPS. Our APPS (Nurse Practitioners and Physician Assistants) work with your physician to ensure care continuity for you. They are fully qualified to address your health concerns and develop a treatment plan. They communicate directly with your gastroenterologist to care for you. Seeing the Advanced Practice Practitioners on your physician's team can help you by facilitating care more promptly, often allowing for earlier appointments, access to diagnostic testing, procedures, and other specialty referrals.    Thank you for trusting me with your gastrointestinal care!    Dr. Victory Legrand DOUGLAS [facility]

## 2023-11-13 ENCOUNTER — Telehealth: Admitting: Neurology

## 2023-11-13 ENCOUNTER — Ambulatory Visit (HOSPITAL_COMMUNITY)
Admission: RE | Admit: 2023-11-13 | Discharge: 2023-11-13 | Disposition: A | Discharge status: Home / Self Care | Source: Ambulatory Visit | Attending: Gastroenterology | Admitting: Gastroenterology

## 2023-11-13 DIAGNOSIS — R140 Abdominal distension (gaseous): Secondary | ICD-10-CM | POA: Insufficient documentation

## 2023-11-13 DIAGNOSIS — K5909 Other constipation: Secondary | ICD-10-CM | POA: Diagnosis present

## 2023-11-13 DIAGNOSIS — R1084 Generalized abdominal pain: Secondary | ICD-10-CM | POA: Diagnosis present

## 2023-11-17 ENCOUNTER — Ambulatory Visit: Payer: Self-pay | Admitting: Gastroenterology

## 2023-12-11 ENCOUNTER — Encounter: Admitting: Gastroenterology

## 2023-12-14 ENCOUNTER — Other Ambulatory Visit: Payer: Self-pay | Admitting: Nurse Practitioner

## 2023-12-14 DIAGNOSIS — E559 Vitamin D deficiency, unspecified: Secondary | ICD-10-CM

## 2023-12-15 ENCOUNTER — Encounter: Payer: Self-pay | Admitting: Gastroenterology

## 2023-12-15 NOTE — Telephone Encounter (Signed)
 Please advise [facility]

## 2023-12-17 NOTE — Telephone Encounter (Signed)
 Thanks for the note.  Any procedure I recommend is medically necessary, or I would not recommend it.  Suspect she is really asking, in the context of the associated costs, is what we might discover on it to explain her symptoms, which of course I cannot know before doing it.   With her symptoms and father's Hx of colon cancer, my advice is still to have the colonoscopy if she feels ready to do that.  - H. Legrand, MD

## 2023-12-21 NOTE — Telephone Encounter (Signed)
 Thanks for the update.  For documentation purposes only, I see the recent patient messages.  I don't know how she recalls our conversation, but to clarify - I did not suggest the patient was paranoid or anything of the sort and would not use verbiage of that kind.  -H. Legrand, MD

## 2023-12-22 ENCOUNTER — Encounter: Admitting: Gastroenterology

## 2024-02-12 ENCOUNTER — Other Ambulatory Visit: Payer: Self-pay | Admitting: Nurse Practitioner

## 2024-02-12 DIAGNOSIS — E559 Vitamin D deficiency, unspecified: Secondary | ICD-10-CM

## 2024-02-12 NOTE — Telephone Encounter (Signed)
 Vitamin D , Ergocalciferol , (DRISDOL ) 1.25 MG (50000 UNIT) CAPS capsule [Pharmacy Med Name: VITAMIN D2 50,000IU (ERGO) CAP RX]

## 2024-02-12 NOTE — Telephone Encounter (Signed)
 Please advise [facility]

## 2024-02-19 ENCOUNTER — Ambulatory Visit: Payer: Self-pay | Admitting: Nurse Practitioner

## 2024-03-25 ENCOUNTER — Encounter: Payer: Self-pay | Admitting: Nurse Practitioner
